# Patient Record
Sex: Female | Born: 1950 | Hispanic: Yes | Marital: Married
Health system: Midwestern US, Community
[De-identification: ages and names within clinical notes are randomized; demographics above are authoritative.]

## PROBLEM LIST (undated history)

## (undated) DIAGNOSIS — K2100 Gastro-esophageal reflux disease with esophagitis, without bleeding: Secondary | ICD-10-CM

## (undated) DIAGNOSIS — E119 Type 2 diabetes mellitus without complications: Principal | ICD-10-CM

## (undated) DIAGNOSIS — Z1211 Encounter for screening for malignant neoplasm of colon: Principal | ICD-10-CM

## (undated) DIAGNOSIS — E7800 Pure hypercholesterolemia, unspecified: Principal | ICD-10-CM

## (undated) DIAGNOSIS — R1013 Epigastric pain: Secondary | ICD-10-CM

## (undated) DIAGNOSIS — E785 Hyperlipidemia, unspecified: Secondary | ICD-10-CM

## (undated) DIAGNOSIS — L609 Nail disorder, unspecified: Secondary | ICD-10-CM

## (undated) DIAGNOSIS — R197 Diarrhea, unspecified: Secondary | ICD-10-CM

## (undated) DIAGNOSIS — A048 Other specified bacterial intestinal infections: Secondary | ICD-10-CM

## (undated) DIAGNOSIS — N201 Calculus of ureter: Secondary | ICD-10-CM

## (undated) DIAGNOSIS — K580 Irritable bowel syndrome with diarrhea: Secondary | ICD-10-CM

## (undated) DIAGNOSIS — B354 Tinea corporis: Secondary | ICD-10-CM

## (undated) DIAGNOSIS — E870 Hyperosmolality and hypernatremia: Secondary | ICD-10-CM

## (undated) DIAGNOSIS — Z1239 Encounter for other screening for malignant neoplasm of breast: Secondary | ICD-10-CM

## (undated) DIAGNOSIS — F419 Anxiety disorder, unspecified: Secondary | ICD-10-CM

## (undated) DIAGNOSIS — K5730 Diverticulosis of large intestine without perforation or abscess without bleeding: Secondary | ICD-10-CM

## (undated) DIAGNOSIS — 1 ERRONEOUS ENCOUNTER ICD10: Secondary | ICD-10-CM

## (undated) DIAGNOSIS — M549 Dorsalgia, unspecified: Secondary | ICD-10-CM

## (undated) DIAGNOSIS — Z1231 Encounter for screening mammogram for malignant neoplasm of breast: Secondary | ICD-10-CM

## (undated) DIAGNOSIS — R1011 Right upper quadrant pain: Secondary | ICD-10-CM

## (undated) DIAGNOSIS — D125 Benign neoplasm of sigmoid colon: Secondary | ICD-10-CM

## (undated) DIAGNOSIS — Z01419 Encounter for gynecological examination (general) (routine) without abnormal findings: Secondary | ICD-10-CM

## (undated) DIAGNOSIS — R928 Other abnormal and inconclusive findings on diagnostic imaging of breast: Secondary | ICD-10-CM

## (undated) DIAGNOSIS — K625 Hemorrhage of anus and rectum: Secondary | ICD-10-CM

## (undated) DIAGNOSIS — M51362 Other intervertebral disc degeneration, lumbar region with discogenic back pain and lower extremity pain: Principal | ICD-10-CM

## (undated) DIAGNOSIS — E559 Vitamin D deficiency, unspecified: Principal | ICD-10-CM

---

## 2010-03-29 LAB — CBC WITH DIFFERENTIAL
Absolute Baso #: 0 10*3/uL (ref 0.0–0.2)
Absolute Eos #: 0.3 10*3/uL (ref 0.0–0.4)
Absolute Lymph #: 2 10*3/uL (ref 1.0–4.8)
Absolute Mono #: 0.7 10*3/uL (ref 0.1–1.2)
Absolute Neut #: 3.8 10*3/uL (ref 1.8–7.7)
Basophils: 0 % (ref 0–2)
Eosinophils %: 4 % (ref 1–4)
Hematocrit: 40.1 % (ref 36–46)
Hemoglobin: 13.4 g/dL (ref 12.0–16.0)
Lymphocytes: 29 % (ref 24–44)
MCH: 29.5 pg (ref 26–34)
MCHC: 33.5 g/dL (ref 31–37)
MCV: 88.2 fL (ref 80–100)
MPV: 8.4 fL (ref 6.0–12.0)
Monocytes: 10 % (ref 2–11)
Platelet Count: 248 10*3/uL (ref 140–450)
RBC: 4.54 m/uL (ref 4.0–5.2)
RDW: 14.1 % (ref 12.5–15.4)
Seg Neutrophils: 57 % (ref 36–66)
WBC: 6.8 10*3/uL (ref 3.5–11.0)

## 2010-03-29 LAB — COMPREHENSIVE METABOLIC PANEL
ALT: 28 U/L (ref 4–40)
AST: 32 U/L (ref 8–36)
Albumin/Globulin Ratio: 1.3 (ref 1.0–2.7)
Albumin: 4.3 g/dL (ref 3.4–4.8)
Alkaline Phosphatase: 81 U/L (ref 25–100)
Anion Gap: 14 mmol/L (ref 8–16)
BUN: 20 mg/dL (ref 6–20)
CO2: 29 mmol/L (ref 20–31)
Calcium: 9.7 mg/dL (ref 8.6–10.4)
Chloride: 105 mmol/L (ref 98–110)
Creatinine: 0.75 mg/dL (ref 0.4–1.0)
GFR African American: >60 mL/min (ref >60)
GFR Non-African American: >60 mL/min (ref >60)
Glucose: 121 mg/dL — ABNORMAL HIGH (ref 74–106)
Potassium: 4.4 mmol/L (ref 3.5–5.1)
Protein, Total: 7.6 g/dL (ref 6.4–8.3)
Sodium: 144 mmol/L (ref 136–145)
Total Bilirubin: 0.5 mg/dL (ref 0.3–1.2)

## 2010-03-29 LAB — LIPID PANEL
Chol/HDL Ratio: 4.8 (ref <5.0)
Cholesterol: 203 mg/dL — ABNORMAL HIGH (ref <200)
HDL: 42 mg/dL (ref >40)
LDL Cholesterol: 133 mg/dL — ABNORMAL HIGH (ref <100)
Triglycerides: 138 mg/dL (ref <150)

## 2010-03-30 LAB — HEMOGLOBIN A1C
Estimated Avg Glucose: 148 mg/dL
Hemoglobin A1C: 6.8 % — ABNORMAL HIGH (ref 4.0–6.0)

## 2010-12-09 LAB — LIPID PANEL
Chol/HDL Ratio: 5.2 — ABNORMAL HIGH (ref <5.0)
Cholesterol: 207 mg/dL — ABNORMAL HIGH (ref <200)
HDL: 40 mg/dL — ABNORMAL LOW (ref >40)
LDL Cholesterol: 138 mg/dL — ABNORMAL HIGH (ref <100)
Triglycerides: 146 mg/dL (ref <150)

## 2010-12-09 LAB — CBC WITH AUTO DIFFERENTIAL
Absolute Eos #: 0.3 10*3/uL (ref 0.0–0.4)
Absolute Lymph #: 2.4 10*3/uL (ref 1.0–4.8)
Absolute Mono #: 0.7 10*3/uL (ref 0.1–1.2)
Absolute Neut #: 4.1 10*3/uL (ref 1.8–7.7)
Basophils Absolute: 0 10*3/uL (ref 0.0–0.2)
Basophils: 0 % (ref 0–2)
Eosinophils %: 4 % (ref 1–4)
Hematocrit: 40.7 % (ref 36–46)
Hemoglobin: 13.8 g/dL (ref 12.0–16.0)
Lymphocytes: 32 % (ref 24–44)
MCH: 29.9 pg (ref 26–34)
MCHC: 33.9 g/dL (ref 31–37)
MCV: 88.3 fL (ref 80–100)
MPV: 8.5 fL (ref 6.0–12.0)
Monocytes: 9 % (ref 2–11)
Platelets: 244 10*3/uL (ref 140–450)
RBC: 4.61 m/uL (ref 4.0–5.2)
RDW: 14 % (ref 12.5–15.4)
Seg Neutrophils: 55 % (ref 36–66)
WBC: 7.5 10*3/uL (ref 3.5–11.0)

## 2010-12-09 LAB — COMPREHENSIVE METABOLIC PANEL
ALT: 39 U/L (ref 4–40)
AST: 42 U/L — ABNORMAL HIGH (ref 8–36)
Albumin/Globulin Ratio: 1.2 (ref 1.0–2.7)
Albumin: 4.3 g/dL (ref 3.4–4.8)
Alkaline Phosphatase: 95 U/L (ref 25–100)
Anion Gap: 12 mmol/L (ref 8–16)
BUN: 23 mg/dL — ABNORMAL HIGH (ref 6–20)
CO2: 28 mmol/L (ref 20–31)
Calcium: 9.4 mg/dL (ref 8.6–10.4)
Chloride: 108 mmol/L (ref 98–110)
Creatinine: 0.79 mg/dL (ref 0.4–1.0)
GFR African American: >60 mL/min (ref >60)
GFR Non-African American: >60 mL/min (ref >60)
Glucose: 126 mg/dL — ABNORMAL HIGH (ref 74–106)
Potassium: 4.4 mmol/L (ref 3.5–5.1)
Protein, Total: 7.9 g/dL (ref 6.4–8.3)
Sodium: 144 mmol/L (ref 136–145)
Total Bilirubin: 0.35 mg/dL (ref 0.3–1.2)

## 2010-12-09 LAB — MICROALBUMIN, UR
Creatinine, Ur: 146.5 mg/dL
Microalb, Ur: <12 mg/L (ref 0–19)
Microalb/Crt. Ratio: UNDETERMINED mcg/mg creat

## 2010-12-09 LAB — HEMOGLOBIN A1C
Estimated Avg Glucose: 171 mg/dL
Hemoglobin A1C: 7.6 % — ABNORMAL HIGH (ref 4.0–6.0)

## 2011-04-18 NOTE — Telephone Encounter (Signed)
Dr.Chart is abstracted so you can go ahead and pre-order labs.

## 2011-04-18 NOTE — Telephone Encounter (Signed)
previous Marissa Sawyer Pt. Having cramping in her legs wants to know if you would like to order labs prior to apt on 05/30/2011. Says Dr.Cuevas would always re run labs about every 3 months. If not she understands and will dscuss at apt. Pt cb # [id number] or [phone].

## 2011-05-10 LAB — FORWARDED TO:

## 2011-05-10 NOTE — Progress Notes (Signed)
History and Physical  [facility]  [facility] [id number]  [place], [place]  [phone]   Fax [phone]  Marissa Sawyer  05/10/2011              61 y.o.  Chief Complaint   Patient presents with   ??? Established New Doctor   ??? Annual Exam              No referring provider defined for this encounter.    The patient was seen and examined. She has no chief complaint today and is here for her annual exam.  Her bowels are regular. There are no voiding complaints. She denies any bloating.  She denies vaginal discharge and was counseled on STD's and the need for barrier contraception.     HPI : annual  ________________________________________________________________________    Past Medical History   Diagnosis Date   ??? Type II or unspecified type diabetes mellitus without mention of complication, not stated as uncontrolled    ??? GERD (gastroesophageal reflux disease)    ??? Hypertension    ??? Hyperlipidemia                                                                    Past Surgical History   Procedure Laterality Date   ??? Tubal ligation     ??? Cholecystectomy  1971     History reviewed. No pertinent family history.  History   Smoking status   ??? Never Smoker    Smokeless tobacco   ??? Never Used     History   Alcohol Use No       MEDICATIONS:  Current Outpatient Prescriptions   Medication Sig Dispense Refill   ??? chlorhexidine (PERIDEX) 0.12 % solution        ??? HYDROcodone-acetaminophen (VICODIN) 5-500 MG per tablet        ??? lisinopril (PRINIVIL;ZESTRIL) 10 MG tablet Take 10 mg by mouth daily.       ??? glimepiride (AMARYL) 4 MG tablet Take 4 mg by mouth 2 times daily.       ??? metformin (GLUCOPHAGE) 500 MG tablet Take 500 mg by mouth once.       ??? simvastatin (ZOCOR) 10 MG tablet Take 10 mg by mouth nightly.         No current facility-administered medications for this visit.       ALLERGIES:  Allergies as of 05/10/2011   ??? (No Known Allergies)     Immunization status: up to date and  documented.      Gynecologic History:  Menarche: 61 yo  Menopause at 61 yo     No LMP recorded. Patient is postmenopausal.    Sexually Active: Yes    STD History: No     Permanent Sterilization: No   Reversible Birth Control: No        Hormone Replacement Exposure: No      Family History of Breast, Ovarian , Colon or Uterine Cancer: No   If YES see scanned worksheet.    Preventative Health Testing:  Date of Last Pap Smear: 2010  Abnormal Pap Smear History:   Colposcopy History:   Date of Last Mammogram: 01/25/11  Date of Last Colonoscopy:  Date of Last Bone Density:      ________________________________________________________________________  REVIEW OF SYSTEMS:       A minimum of an eleven point review of systems was completed.    Review Of Systems (11 point):  Constitutional: No fever, chills or malaise; No weight change or fatigue  Head and Eyes: No vision, Headache, Dizziness or trauma in last 12 months  ENT ROS: No hearing, Tinnitis, sinus or taste problems  Hematological and Lymphatic ROS:No Lymphoma, Von Willebrand's, Hemophillia or Bleeding History  Psych ROS: No Depression, Homicidal thoughts,suicidal thoughts, or anxiety  Breast ROS: No prior breast abnormalities or lumps  Respiratory ROS: No SOB, Pneumoniae,Cough, or Pulmonary Embolism History  Cardiovascular ROS: No Chest Pain with Exertion, Palpitations, Syncope, Edema, Arrhythmia  Gastrointestinal ROS: No Indigestion, Heartburn, Nausea, vomiting, Diarrhea, Constipation,or Bowel Changes; No Bloody Stools or melena  Genito-Urinary ROS: No Dysuria, Hematuria or Nocturia. No Urinary Incontinence or Vaginal Discharge  Musculoskeletal ROS: No Arthralgia, Arthritis,Gout,Osteoporosis or Rheumatism  Neurological ROS: No CVA, Migraines, Epilepsy, Seizure Hx, or Limb Weakness  Dermatological ROS: No Rash, Itching, Hives, Mole Changes or Cancer                                                                                                                                                                                                                                  PHYSICAL Exam:     Constitutional:  Blood pressure 102/60, pulse 72, resp. rate 16, height 5\' 5"  (1.651 m), weight 191 lb (86.637 kg), not currently breastfeeding.    General Appearance:  This  is a well Developed, well Nourished, well groomed female.      Her BMI was reviewed. Nutritional decision making was discussed.    Skin:  There was a Normal Inspection of the skin without rashes or lesions.  There were no rashes.  (Papular, Maculopapular, Hives, Pustular, Macular)     There were no lesions (Ulcers, Erythema, Abn. Appearing Nevi)            Lymphatic:  No Lymph Nodes were Palpable in the neck , axilla or groin.   # Of Lymph Nodes; Location ; Character [Normal]  [Shotty] [Tender] [Enlarged]     Neck and EENT:  The neck was supple. There were no masses   The thyroid was not enlarged and had no masses.  Perrla, EOMI B/L, TMI B/L No Abnormalities.   Throat inspected-No exudates or Masses, Nares Patent  No Masses        Respiratory:  The lungs were auscultated and found to be clear. There were no rales, rhonchi or wheezes. There was a good respiratory effort.    Cardiovascular:  The heart was in a regular rate and rhythm. . No S3 or S4. There was no murmur appreciated. Location, grade, and radiation are not applicable.     Extremities:  The patients extremities were without calf tenderness, edema, or varicosities.  There was full range of motion in all four extremities. Pulses in all four extremities were appreciated and are 2/4.    Abdomen:  The abdomen was soft and non-tender. There were good bowel sounds in all quadrants and there was no guarding, rebound or rigidity.  On evaluation there was no evidence of hepatosplenomegaly and there was no costal vertebral angel tenderness bilaterally.  No hernias were appreciated.     Abdominal Scars:     Psych:  The patient had a normal Orientation to: Time, Place,  Person, and Situation  There is no Mood / Affect changes    Breast:  (Chest)  normal appearance, no masses or tenderness  Self breast exams were reviewed in detail. Literature was given.    Pelvic Exam:  Vulva and vagina appear normal. Bimanual exam reveals normal uterus and adnexa.    Rectal Exam:  exam declined by patient          Musculosk:  Normal Gait and station was noted.  Digits were evaluated without abnormal findings.  Range of motion, stability and strength were evaluated and found to be appropriate for the patients age.              ASSESSMENT:      61 y.o. Annual  1. Routine gynecological examination  PAP SMEAR, DEXA Vertebral Fracture Assessment          Chief Complaint   Patient presents with   ??? Established New Doctor   ??? Annual Exam          Past Medical History   Diagnosis Date   ??? Type II or unspecified type diabetes mellitus without mention of complication, not stated as uncontrolled    ??? GERD (gastroesophageal reflux disease)    ??? Hypertension    ??? Hyperlipidemia          Patient Active Problem List   Diagnosis   ??? Type II or unspecified type diabetes mellitus without mention of complication, not stated as uncontrolled   ??? GERD (gastroesophageal reflux disease)   ??? Hyperlipidemia   ??? Hyperlipidemia          Hereditary Breast, Ovarian, Colon and Uterine Cancer screening Done.          Tobacco & Secondary smoke risks reviewed; instructed on cessation and avoidance      Counseling Completed:  The patient was informed of the recommended preventative health recommendations.  1. Pap smears every year if there is no dysplasia  2. Mammograms begin every year at 61 yo if no abnormalities are found and no family     History.  3. Bone density studies every 2-3 years. Begin at 61 yo. If no fracture history or osteoporosis family history.(significant).  4. Colonoscopy begin at 61 yo. Repeat every ten years if negative and no family history.  5. Calcium of 1200-1500 mg/day in split dosing  6. Vitamin D  400-800 IU/day  7. All other preventative health recommendations will be managed by the patients Primary care physician.  PLAN:  No Follow-up on file.  Repeat pap every 1 year if negative.   Mammograms every 1 year. If 61 yo and last mammogram was negative.  Calcium and Vitamin D dosing reviewed.  Colonoscopy screening reviewed as well as onset for bone density testing.  Birth control and barrier recommendations discussed.  STD counseling and prevention reviewed.  Gardisil counseling completed for all patients 9-26 yo.  Routine health maintenance per patients PCP.  Orders Placed This Encounter   Procedures   ??? DEXA Vertebral Fracture Assessment     Order Specific Question:  Reason for exam:     Answer:  other ovarian failure   ??? PAP SMEAR     Order Specific Question:  Collection Type?     Answer:  ( 2) Thin prep     Order Specific Question:  Diagnostic or Screening?     Answer:  ( 1) Screening     Order Specific Question:  HPV Requested?     Answer:  ( 5) HPV if ASCUS     Order Specific Question:  Present Status     Answer:  ( 3) Post menopausal     Order Specific Question:  Hysterectomy?     Answer:  N/A     Order Specific Question:  Hormone Replacement?     Answer:  N/A     Order Specific Question:  Birth Control     Answer:  N/A     Order Specific Question:  Present Illness     Answer:  (12) No present illness     Order Specific Question:  Specify Illness if other     Answer:  NA     Order Specific Question:  HPV Status?     Answer:  N/A     Order Specific Question:  Clinical History     Answer:  N/A     Order Specific Question:  Specify Clinical History if other     Answer:  NA     Order Specific Question:  Last Menstrual Period     Answer:  2008     Order Specific Question:  Last PAP Smear     Answer:  2010     Order Specific Question:  Prior Abnormal Smear     Answer:  (13) No prior abnormal smear     Order Specific Question:  If Prior Abnormal, Give Date     Answer:  n/a     Order Specific  Question:  Prior Treatment     Answer:  N/A     Order Specific Question:  Specify Treatment if other     Answer:  NA     Order Specific Question:  Specimen Source     Answer:  ( 3) Cervical/Endocervical/Vaginal     Order Specific Question:  Specify Source if other     Answer:  NA

## 2011-05-12 LAB — GYN CYTOLOGY

## 2011-05-30 LAB — LIPID PANEL
Chol/HDL Ratio: 4.4 (ref <5.0)
Cholesterol: 160 mg/dL (ref <200)
HDL: 36 mg/dL — ABNORMAL LOW (ref >40)
LDL Cholesterol: 95 mg/dL (ref <100)
Triglycerides: 146 mg/dL (ref <150)

## 2011-05-30 LAB — VITAMIN D 25 HYDROXY: Vit D, 25-Hydroxy: 15 ng/mL — ABNORMAL LOW (ref 30–100)

## 2011-05-30 LAB — CBC WITH AUTO DIFFERENTIAL
Absolute Eos #: 0.3 10*3/uL (ref 0.0–0.4)
Absolute Lymph #: 2.3 10*3/uL (ref 1.0–4.8)
Absolute Mono #: 0.7 10*3/uL (ref 0.1–1.2)
Absolute Neut #: 3.8 10*3/uL (ref 1.8–7.7)
Basophils Absolute: 0 10*3/uL (ref 0.0–0.2)
Basophils: 0 % (ref 0–2)
Eosinophils %: 4 % (ref 1–4)
Hematocrit: 39.5 % (ref 36–46)
Hemoglobin: 13.1 g/dL (ref 12.0–16.0)
Lymphocytes: 32 % (ref 24–44)
MCH: 29.1 pg (ref 26–34)
MCHC: 33.1 g/dL (ref 31–37)
MCV: 88 fL (ref 80–100)
MPV: 8.2 fL (ref 6.0–12.0)
Monocytes: 10 % (ref 2–11)
Platelets: 225 10*3/uL (ref 140–450)
RBC: 4.49 m/uL (ref 4.0–5.2)
RDW: 13.8 % (ref 12.5–15.4)
Seg Neutrophils: 54 % (ref 36–66)
WBC: 7.1 10*3/uL (ref 3.5–11.0)

## 2011-05-30 LAB — COMPREHENSIVE METABOLIC PANEL
ALT: 42 U/L — ABNORMAL HIGH (ref 4–40)
AST: 45 U/L — ABNORMAL HIGH (ref 8–36)
Albumin/Globulin Ratio: 1.3 (ref 1.0–2.7)
Albumin: 4.4 g/dL (ref 3.4–4.8)
Alkaline Phosphatase: 85 U/L (ref 25–100)
Anion Gap: 13 mmol/L (ref 8–16)
BUN: 18 mg/dL (ref 6–20)
CO2: 28 mmol/L (ref 20–31)
Calcium: 9.3 mg/dL (ref 8.6–10.4)
Chloride: 107 mmol/L (ref 98–110)
Creatinine: 0.77 mg/dL (ref 0.4–1.0)
GFR African American: >60 mL/min (ref >60)
GFR Non-African American: >60 mL/min (ref >60)
Glucose: 126 mg/dL — ABNORMAL HIGH (ref 74–106)
Potassium: 4.4 mmol/L (ref 3.5–5.1)
Sodium: 143 mmol/L (ref 136–145)
Total Bilirubin: 0.55 mg/dL (ref 0.3–1.2)
Total Protein: 7.7 g/dL (ref 6.4–8.3)

## 2011-05-30 LAB — HEMOGLOBIN A1C
Estimated Avg Glucose: 174 mg/dL
Hemoglobin A1C: 7.7 % — ABNORMAL HIGH (ref 4.0–6.0)

## 2011-05-30 MED ORDER — BLOOD GLUCOSE TEST VI STRP
ORAL_STRIP | Status: DC
Start: 2011-05-30 — End: 2015-02-04

## 2011-05-30 MED ORDER — FREESTYLE SYSTEM KIT
PACK | Freq: Once | Status: DC
Start: 2011-05-30 — End: 2015-02-04

## 2011-05-30 MED ORDER — LANCETS MISC
Status: DC
Start: 2011-05-30 — End: 2015-02-04

## 2011-05-30 NOTE — Progress Notes (Signed)
Pt is here to establish, previous Cuevas patient, needs routine labs. Pt may need order for colonscopy and glucose meter and supplies.    Have you seen any other physician or provider since your last visit no    Have you had any other diagnostic tests since your last visit? yes - mammogram    Have you changed or stopped any medications since your last visit including any over-the-counter medicines, vitamins, or herbal medicines? no     Are you taking all your prescribed medications? Yes    If NO, why?          Health Maintenance  No health maintenance topics applied.  No health maintenance topics applied.  No health maintenance topics applied.  Health Maintenance Due   Topic Date Due   . Colon Cancer Screening Colonoscopy  11/30/1999     No health maintenance topics applied.    No health maintenance topics applied.        Hemoglobin A1C (%)   Date Value   12/09/2010 7.6*   03/29/2010 6.8*             ( goal A1C is < 7)   LDL Cholesterol (mg/dL)   Date Value   16/03/958 138*         (goal LDL is <100)        BP Readings from Last 3 Encounters:   05/30/11 110/74   05/10/11 102/60        (goal 120/80)      Next Visit Date:  05/30/2011    Last Visit Date:    Patient Active Problem List:     Type II or unspecified type diabetes mellitus without mention of complication, not stated as uncontrolled     GERD (gastroesophageal reflux disease)     Hyperlipidemia     Hyperlipidemia    i reviewed all medications with the patient.       Subjective:      Marissa Sawyer is here for follow up of elevated cholesterol, elevated blood pressure, and diabetes. Compliance with treatment has been good. Patient denies muscle pain associated with her medications. She is not exercising and is adherent to a low-salt diet.  Blood pressure is well controlled at home. Cardiac symptoms: none. Patient denies: chest pain, dyspnea, irregular heart beat, lower extremity edema, palpitations and syncope. Cardiovascular risk factors: diabetes mellitus,  dyslipidemia, hypertension, obesity (BMI >= 30 kg/m2) and sedentary lifestyle. Use of agents associated with hypertension: none. History of target organ damage: none. Current diabetic symptoms include: hyperglycemia. Patient denies foot ulcerations, hypoglycemia , nausea, polydipsia, polyuria and vomiting. Evaluation to date has included: fasting blood sugar, fasting lipid panel and hemoglobin A1C. Home sugars: patient does not check sugars yet. Current treatments: Continued sulfonylurea which has been effective, Continued metformin which has been effective, Continued statin which has been effective and Continued ACE inhibitor/ARB which has been effective. Last dilated eye exam never.    Patient's medications, allergies, past medical, surgical, social and family histories were reviewed and updated as appropriate.    Review of Systems  A comprehensive review of systems was negative except for: Eyes: positive for visual disturbance  Gastrointestinal: positive for reflux symptoms  Behavioral/Psych: positive for obesity      Objective:      BP 110/74  Pulse 60  Temp(Src) 97.5 F (36.4 C) (Oral)  Resp 16  Ht 5\' 4"  (1.626 m)  Wt 189 lb 9.6 oz (86.002 kg)  BMI 32.53 kg/m2  Breastfeeding?  No    General Appearance:    Alert, cooperative, no distress, appears stated age   Head:    Normocephalic, without obvious abnormality, atraumatic   Eyes:    PERRL, conjunctiva/corneas clear, EOM's intact, fundi     benign, both eyes   Ears:    Normal TM's and external ear canals, both ears   Nose:   Nares normal, septum midline, mucosa normal, no drainage    or sinus tenderness   Throat:   Lips, mucosa, and tongue normal; teeth and gums normal   Neck:   Supple, symmetrical, trachea midline, no adenopathy;     thyroid:  no enlargement/tenderness/nodules; no carotid    bruit or JVD   Back:     Symmetric, no curvature, ROM normal, no CVA tenderness   Lungs:     Clear to auscultation bilaterally, respirations unlabored   Chest Wall:     No tenderness or deformity    Heart:    Regular rate and rhythm, S1 and S2 normal, no murmur, rub   or gallop   Breast Exam:    Deferred   Abdomen:     Soft, non-tender, bowel sounds active all four quadrants,     no masses, no organomegaly   Genitalia:    Deferred   Rectal:    Deferred   Extremities:   Extremities normal, atraumatic, no cyanosis or edema   Pulses:   2+ and symmetric all extremities   Skin:   Skin color, texture, turgor normal, no rashes or lesions   Lymph nodes:   Cervical, supraclavicular, and axillary nodes normal   Neurologic:   CNII-XII intact, normal strength, sensation and reflexes     throughout       Lab Review  Lab Results   Component Value Date    CHOL 207 12/09/2010    CHOL 203 03/29/2010    HDL 40 12/09/2010    HDL 42 03/29/2010         Assessment:      Dyslipidemia under fair control.  Hypertension, normal blood pressure . Evidence of target organ damage: none.  Diabetes mellitus Type II, under fair control. GERD. Vit D def. Obesity.        Plan:      1. Continue dietary measures.  2. Continue regular exercise.  3. Lipid-lowering medications: continue Zocor.  4. Counseling at today's visit: discussed the need for weight loss, focused on the need for regular aerobic exercise and discussed the advantages of a diet low in carbohydrates.  Reminded to get yearly retinal exam.  5. Follow up in 3 months or prn.  6. Check labs per orders.  7. Glucometer & supplies per orders.  8. Continue current meds.       Zephaniah received counseling on the following healthy behaviors: nutrition and exercise    Patient given educational materials on nutrition    Was a self-tracking handout given in paper form or via MyChart? No  If yes, see orders or list here.    Discussed use, benefit, and side effects of prescribed medications.  Barriers to medication compliance addressed.  All patient questions answered.  Pt voiced understanding.

## 2011-05-30 NOTE — Patient Instructions (Signed)
Diabetes Diet Guidelines: After Your Visit  Your Care Instructions  A healthy diet is important to manage diabetes. It helps keep your blood sugar at a target level (which you set with your doctor). A diet for diabetes does not mean that you have to eat special foods. You can eat what your family eats, including sweets once in a while. But you do have to pay attention to how often you eat and how much you eat of certain foods.  Managing the amount of carbohydrate you eat is an important part of a healthy diet for diabetes. Carbohydrate is found in sugar and sweets, grains, fruit, starchy vegetables, and milk and yogurt.  You may want to work with a dietitian or a certified diabetes educator (CDE) to help you plan meals and snacks. A dietitian or CDE can also help you lose weight if that is one of your goals.  Follow-up care is a key part of your treatment and safety. Be sure to make and go to all appointments, and call your doctor if you are having problems. It???s also a good idea to know your test results and keep a list of the medicines you take.  How can you care for yourself at home?  ?? Learn which foods have carbohydrate.  ?? Bread, cereal, pasta, and rice have about 15 grams of carbohydrate in a serving. A serving is 1 slice of bread (1 ounce), ?? cup of cooked cereal, or 1/3 cup of cooked pasta or rice.  ?? Fruits have 15 grams of carbohydrate in a serving. A serving is 1 small fresh fruit, such as an apple or orange; ?? of a banana; ?? cup of cooked or canned fruit; ?? cup of fruit juice; 1 cup of melon or raspberries; or 2 tablespoons of dried fruit.  ?? Milk and no-sugar-added yogurt have 15 grams of carbohydrate in a serving. A serving is 1 cup of milk or 2/3 cup of no-sugar-added yogurt.  ?? Starchy vegetables have 15 grams of carbohydrate in a serving. A serving is ?? cup of mashed potatoes or sweet potato; 1 cup winter squash; ?? of a small baked potato; 1/4 cup of cooked dried beans; or ?? cup cooked corn or  green peas.  ?? Learn how much carbohydrate to eat each day. A dietitian or CDE can teach you how to keep track of the amount of carbohydrate you eat. This is called carbohydrate counting.  ?? Try to eat about the same amount of carbohydrate at each meal. Your dietitian or CDE can tell you how much carbohydrate to eat at each meal and snack. Do not "save up" your daily allowance of carbohydrate to eat at one meal.  ?? If you are not sure how to count carbohydrate grams, use the Plate Method to plan meals. It is a good, quick way to make sure that you have a balanced meal. It also helps you spread carbohydrate throughout the day. Divide your plate by types of foods. Put vegetables on half the plate, meat or other protein food on one-quarter of the plate, and a grain or starchy vegetable (such as brown rice or a potato) in the final quarter of the plate. To this you can add a small piece of fruit and 1 cup of milk or yogurt, depending on how much carbohydrate you are supposed to eat at a meal.  ?? Limit saturated fat, such as the fat from meat and dairy products. Choose lean cuts of meat and nonfat or   low-fat dairy products. Use olive or canola oil instead of butter or shortening when cooking. This is a healthy choice because people who have diabetes are at higher-than-average risk of heart disease.  ?? Do not skip meals. Your blood sugar may drop too low if you skip meals and take certain diabetes pills or insulin.  ?? Work with your doctor to write up a sick-day plan for what to do on days when you are sick. Your blood sugar can go up or down depending on your illness and whether you can keep food down. Call your doctor when you are sick, to see if you need to adjust your pills or insulin.  ?? Check with your doctor before you drink alcohol. Alcohol can cause your blood sugar to drop too low. Alcohol can also cause a bad reaction if you take certain diabetes pills.   Where can you learn more?   Go to  [email or web address] and sign in to your MyChart account.    Enter I147 in the Search Health Information box to learn more about ???Diabetes Diet Guidelines: After Your Visit.???    If you do not have an account, please click on the ???Sign Up Now??? link.   ?? 2006-2012 [employer]. Care instructions adapted under license by [facility]. This care instruction is for use with your licensed healthcare professional. If you have questions about a medical condition or this instruction, always ask your healthcare professional. [employer] disclaims any warranty or liability for your use of this information.  Content Version: 9.4.94723; Last Revised: February 14, 2010

## 2011-06-26 MED ORDER — LISINOPRIL 10 MG PO TABS
10 MG | ORAL_TABLET | Freq: Every day | ORAL | Status: DC
Start: 2011-06-26 — End: 2012-03-04

## 2011-06-26 MED ORDER — GLIMEPIRIDE 4 MG PO TABS
4 MG | ORAL_TABLET | Freq: Two times a day (BID) | ORAL | Status: DC
Start: 2011-06-26 — End: 2011-12-15

## 2011-06-26 MED ORDER — METFORMIN HCL 500 MG PO TABS
500 MG | ORAL_TABLET | Freq: Once | ORAL | Status: DC
Start: 2011-06-26 — End: 2011-07-24

## 2011-06-26 NOTE — Telephone Encounter (Signed)
Med refill request

## 2011-07-24 MED ORDER — METFORMIN HCL 500 MG PO TABS
500 MG | ORAL_TABLET | Freq: Every day | ORAL | Status: DC
Start: 2011-07-24 — End: 2012-01-15

## 2011-07-24 MED ORDER — CLOTRIMAZOLE-BETAMETHASONE 1-0.05 % EX CREA
CUTANEOUS | Status: DC
Start: 2011-07-24 — End: 2012-06-27

## 2011-07-24 MED ORDER — BUSPIRONE HCL 10 MG PO TABS
10 MG | ORAL_TABLET | Freq: Three times a day (TID) | ORAL | Status: DC | PRN
Start: 2011-07-24 — End: 2012-06-27

## 2011-07-24 NOTE — Progress Notes (Signed)
Have you seen any other physician or provider since your last visit no    Have you had any other diagnostic tests since your last visit? yes - labs    Have you changed or stopped any medications since your last visit including any over-the-counter medicines, vitamins, or herbal medicines? no     Are you taking all your prescribed medications? Yes    If NO, why? - n/a            What is your goal for your visit today? - check acid reflux  Barriers to success: none  Plan for overcoming my barriers: N/A     Confidence: 10/10  Date goal set: 07/24/2011  Date expected to reach goal: today    Patient/Caregiver verbalize understanding of medications.    Subjective:       Marissa Sawyer is an 61 y.o. female who presents for evaluation of heartburn. This has been associated with abdominal bloating, heartburn and nocturnal burning. She denies choking on food, difficulty swallowing, hematemesis, melena and nausea. Symptoms have been present for a few weeks. She denies dysphagia. She has not lost weight. She denies melena, hematochezia, hematemesis, and coffee ground emesis. Medical therapy in the past has included: proton pump inhibitors but she admits to not taking Prevacid regularly. She also reports that she has been feeling anxious & stressed recently.    Patient's medications, allergies, past medical, surgical, social and family histories were reviewed and updated as appropriate.    Review of Systems  A comprehensive review of systems was negative except for: Gastrointestinal: positive for reflux symptoms  Musculoskeletal: positive for arthralgias and myalgias  Behavioral/Psych: positive for anxiety and obesity     Objective:       BP 130/74   Pulse 73   Resp 16   Ht 5\' 4"  (1.626 m)   Wt 189 lb 9.6 oz (86.002 kg)   BMI 32.53 kg/m2   Breastfeeding? No    General Appearance:    Alert, cooperative, no distress, appears stated age   Head:    Normocephalic, without obvious abnormality, atraumatic   Eyes:    PERRL,  conjunctiva/corneas clear, EOM's intact, fundi     benign, both eyes   Ears:    Normal TM's and external ear canals, both ears   Nose:   Nares normal, septum midline, mucosa normal, no drainage    or sinus tenderness   Throat:   Lips, mucosa, and tongue normal; teeth and gums normal   Neck:   Supple, symmetrical, trachea midline, no adenopathy;     thyroid:  no enlargement/tenderness/nodules; no carotid    bruit or JVD   Back:     Symmetric, no curvature, ROM normal, no CVA tenderness   Lungs:     Clear to auscultation bilaterally, respirations unlabored   Chest Wall:    No tenderness or deformity    Heart:    Regular rate and rhythm, S1 and S2 normal, no murmur, rub   or gallop   Breast Exam:    Deferred   Abdomen:     Soft, non-tender, bowel sounds active all four quadrants,     no masses, no organomegaly   Genitalia:    Deferred   Rectal:    Deferred   Extremities:   Extremities normal, atraumatic, no cyanosis or edema   Pulses:   2+ and symmetric all extremities   Skin:   Skin color, texture, turgor normal, + rashes/lesions on bilateral shins  Lymph nodes:   Cervical, supraclavicular, and axillary nodes normal   Neurologic:   CNII-XII intact, normal strength, sensation and reflexes     throughout        Assessment:      Gastroesophageal Reflux Disease,  Ongoing. Tinea corporis. NIDDM. HTN. Hyperlipidemia. Anxiety. Obesity.      Plan:      Nonpharmacologic treatments were discussed including: eating smaller meals, elevation of the head of bed at night, avoidance of caffeine, chocolate, nicotine and peppermint, and avoiding tight fitting clothing.  Follow up in 2 months or sooner as needed.  Continue Prevacid.   Refilled Metformin & Lotrisone.  Start Buspar.    Marissa Sawyer received counseling on the following healthy behaviors: nutrition    Patient given Transport planner on nutrition    Was a self-tracking handout given in paper form or via MyChart? No  If yes, see orders or list here.    Discussed use, benefit,  and side effects of prescribed medications.  Barriers to medication compliance addressed.  All patient questions answered.  Pt voiced understanding.

## 2011-07-24 NOTE — Patient Instructions (Signed)
Gastroesophageal Reflux Disease (GERD): After Your Visit  Your Care Instructions  Gastroesophageal reflux disease (GERD) is the backward flow of stomach acid into the esophagus. The esophagus is the tube that leads from your throat to your stomach. A one-way valve prevents the stomach acid from moving up into this tube. When you have GERD, this valve does not close tightly enough.  If you have mild GERD symptoms including heartburn, you may be able to control the problem with antacids or over-the-counter medicine. Changing your diet, losing weight, and making other lifestyle changes can also help reduce symptoms.  Follow-up care is a key part of your treatment and safety. Be sure to make and go to all appointments, and call your doctor if you are having problems. It???s also a good idea to know your test results and keep a list of the medicines you take.  How can you care for yourself at home?  ?? Take your medicines exactly as prescribed. Call your doctor if you think you are having a problem with your medicine.  ?? Your doctor may recommend over-the-counter medicine. For mild or occasional indigestion, antacids, such as Tums, Gaviscon, Mylanta, or Maalox, may help. Your doctor also may recommend over-the-counter acid reducers, such as Pepcid AC, Tagamet HB, Zantac 75, or Prilosec. Read and follow all instructions on the label. If you use these medicines often, talk with your doctor.  ?? Change your eating habits.  ?? It???s best to eat several small meals instead of two or three large meals.  ?? After you eat, wait 2 to 3 hours before you lie down.  ?? Chocolate, mint, and alcohol can make GERD worse.  ?? Spicy foods, foods that have a lot of acid (like tomatoes and oranges), and coffee can make GERD symptoms worse in some people. If your symptoms are worse after you eat a certain food, you may want to stop eating that food to see if your symptoms get better.  ?? Do not smoke or chew tobacco. Smoking can make GERD worse. If  you need help quitting, talk to your doctor about stop-smoking programs and medicines. These can increase your chances of quitting for good.  ?? If you have GERD symptoms at night, raise the head of your bed 6 to 8 inches by putting the frame on blocks or placing a foam wedge under the head of your mattress. (Adding extra pillows does not work.)  ?? Do not wear tight clothing around your middle.  ?? Lose weight if you need to. Losing just 5 to 10 pounds can help.  When should you call for help?  Call your doctor now or seek immediate medical care if:  ?? You have new or different belly pain.  ?? Your stools are black and tarlike or have streaks of blood.  Watch closely for changes in your health, and be sure to contact your doctor if:  ?? Your symptoms have not improved after 2 days.  ?? Food seems to catch in your throat or chest.   Where can you learn more?   Go to [email or web address] and sign in to your MyChart account.    Enter T927 in the Search Health Information box to learn more about ???Gastroesophageal Reflux Disease (GERD): After Your Visit.???    If you do not have an account, please click on the ???Sign Up Now??? link.   ?? 2006-2012 [employer]. Care instructions adapted under license by [facility]. This care instruction is for use with your   licensed healthcare professional. If you have questions about a medical condition or this instruction, always ask your healthcare professional. [employer] disclaims any warranty or liability for your use of this information.  Content Version: 9.4.94723; Last Revised: May 20, 2009

## 2011-09-20 MED ORDER — TIZANIDINE HCL 4 MG PO TABS
4 MG | ORAL_TABLET | Freq: Three times a day (TID) | ORAL | Status: DC | PRN
Start: 2011-09-20 — End: 2011-12-04

## 2011-09-20 NOTE — Patient Instructions (Signed)
Neck Strain or Sprain: Exercises  Your Care Instructions  Here are some examples of typical rehabilitation exercises for your condition. Start each exercise slowly. Ease off the exercise if you start to have pain.  Your doctor or physical therapist will tell you when you can start these exercises and which ones will work best for you.  How to do the exercises  Neck rotation    1. Sit in a firm chair, or stand up straight.  2. Keeping your chin level, turn your head to the right, and hold for 15 to 30 seconds.  3. Turn your head to the left and hold for 15 to 30 seconds.  4. Repeat 2 to 4 times to each side.  Neck stretches    1. Look straight ahead, and tip your right ear to your right shoulder. Do not let your left shoulder rise up as you tip your head to the right.  2. Hold for 15 to 30 seconds.  3. Tilt your head to the left. Do not let your right shoulder rise up as you tip your head to the left.  4. Hold for 15 to 30 seconds.  5. Repeat 2 to 4 times to each side.  Forward neck flexion    1. Sit in a firm chair, or stand up straight.  2. Bend your head forward.  3. Hold for 15 to 30 seconds.  4. Repeat 2 to 4 times.  Lateral (side) bend strengthening    1. With your right hand, place your first two fingers on your right temple.  2. Start to bend your head to the side while using gentle pressure from your fingers to keep your head from bending.  3. Hold for about 6 seconds.  4. Repeat 8 to 12 times.  5. Switch hands and repeat the same exercise on your left side.  Forward bend strengthening    1. Place your first two fingers of either hand on your forehead.  2. Start to bend your head forward while using gentle pressure from your fingers to keep your head from bending.  3. Hold for about 6 seconds.  4. Repeat 8 to 12 times.  Neutral position strengthening    1. Using one hand, place your fingertips on the back of your head at the top of your neck.  2. Start to bend your head backward while using gentle pressure  from your fingers to keep your head from bending.  3. Hold for about 6 seconds.  4. Repeat 8 to 12 times.  Chin tuck    1. Lie on the floor with a rolled-up towel under your neck. Your head should be touching the floor.  2. Slowly bring your chin toward your chest.  3. Hold for a count of 6, and then relax for up to 10 seconds.  4. Repeat 8 to 12 times.  Follow-up care is a key part of your treatment and safety. Be sure to make and go to all appointments, and call your doctor if you are having problems. It's also a good idea to know your test results and keep a list of the medicines you take.   Where can you learn more?   Go to [email or web address] and sign in to your MyChart account. Enter M679 in the Search Health Information box to learn more about ???Neck Strain or Sprain: Exercises.???    If you do not have an account, please click on the ???Sign Up Now??? link.     ??   2006-2013 [employer]. Care instructions adapted under license by [facility]. This care instruction is for use with your licensed healthcare professional. If you have questions about a medical condition or this instruction, always ask your healthcare professional. [employer] disclaims any warranty or liability for your use of this information.  Content Version: 9.7.130178; Last Revised: January 07, 2010

## 2011-09-20 NOTE — Progress Notes (Signed)
Patient is here because she keeps having a sharp pain shoot up her neck and into the top of head.      Have you seen any other physician or provider since your last visit no    Have you had any other diagnostic tests since your last visit? no    Have you changed or stopped any medications since your last visit including any over-the-counter medicines, vitamins, or herbal medicines? no     Are you taking all your prescribed medications? Yes    If NO, why?              Health Maintenance  No health maintenance topics applied.  No health maintenance topics applied.  No health maintenance topics applied.  Health Maintenance Due   Topic Date Due   ??? Colon Cancer Screening Colonoscopy  11/30/1999     No health maintenance topics applied.    AST (U/L)   Date Value   05/30/2011 45*        ALT (U/L)   Date Value   05/30/2011 42*     LDL Cholesterol (mg/dL)   Date Value   08/01/4130 95      (goal LDL is <100)     BP Readings from Last 3 Encounters:   07/24/11 130/74   05/30/11 110/74   05/10/11 102/60        (goal 120/80)      Next Visit Date:  09/20/2011    Last Visit Date:    Patient Active Problem List:     Type II or unspecified type diabetes mellitus without mention of complication, not stated as uncontrolled     GERD (gastroesophageal reflux disease)     Hyperlipidemia     Hyperlipidemia    I reviewed all medications with the patient.    Subjective:      Marissa Sawyer is an 61 y.o. female who presents with arthralgias and myalgias that began a few weeks ago. Pain is located in head & neck. The pain is described as intermittent, moderate, aching, sharp and shooting.  Associated symptoms include: tenderness. The patient has tried nothing for pain relief. Related to injury: no.    Patient's medications, allergies, past medical, surgical, social and family histories were reviewed and updated as appropriate.    Review of Systems  A comprehensive review of systems was negative except for: Gastrointestinal: positive for reflux  symptoms  Musculoskeletal: positive for arthralgias, myalgias and neck pain  Neurological: positive for headaches and memory problems  Behavioral/Psych: positive for anxiety and obesity   8 of 10 ros positive as above     Objective:      BP 146/90   Temp(Src) 98 ??F (36.7 ??C) (Oral)   Wt 188 lb 9.6 oz (85.548 kg)   BMI 32.36 kg/m2   Breastfeeding? No    General Appearance:    Alert, cooperative, no distress, appears stated age   Head:    Normocephalic, without obvious abnormality, atraumatic   Eyes:    PERRL, conjunctiva/corneas clear, EOM's intact, fundi     benign, both eyes   Ears:    Normal TM's and external ear canals, both ears   Nose:   Nares normal, septum midline, mucosa normal, no drainage    or sinus tenderness   Throat:   Lips, mucosa, and tongue normal; teeth and gums normal   Neck:   Supple, symmetrical, trachea midline, no adenopathy;     thyroid:  no enlargement/nodules; no carotid bruit or  JVD, posterior muscles tender   Back:     Symmetric, no curvature, ROM normal, no CVA tenderness   Lungs:     Clear to auscultation bilaterally, respirations unlabored   Chest Wall:    No tenderness or deformity    Heart:    Regular rate and rhythm, S1 and S2 normal, no murmur, rub   or gallop   Breast Exam:    Deferred   Abdomen:     Soft, non-tender, bowel sounds active all four quadrants,     no masses, no organomegaly   Genitalia:    Deferred   Rectal:    Deferred   Extremities:   Extremities normal, atraumatic, no cyanosis or edema   Pulses:   2+ and symmetric all extremities   Skin:   Skin color, texture, turgor normal, no rashes or lesions   Lymph nodes:   Cervical, supraclavicular, and axillary nodes normal   Neurologic:   CNII-XII intact, normal strength, sensation and reflexes     throughout         Assessment:      Cervicodynia. Cervical strain.  HA's. DM II. HTN. Hyperlipidemia. Forgetfulness. Obesity.     Plan:      Started Zanaflex per orders.  Return to clinic in 3 months or as needed.  Continue  other meds.   Spent 5 mins answering all questions.    Dayleen received counseling on the following healthy behaviors: nutrition and exercise    Patient given educational materials on exercise    Was a self-tracking handout given in paper form or via MyChart? No  If yes, see orders or list here.    Discussed use, benefit, and side effects of prescribed medications.  Barriers to medication compliance addressed.  All patient questions answered.  Pt voiced understanding.

## 2011-11-28 MED ORDER — SIMVASTATIN 10 MG PO TABS
10 MG | ORAL_TABLET | Freq: Every evening | ORAL | Status: DC
Start: 2011-11-28 — End: 2012-01-15

## 2011-11-28 NOTE — Telephone Encounter (Signed)
appt 12/21/11
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
## 2011-12-04 NOTE — Progress Notes (Signed)
Have you seen any other physician or provider since your last visit yes - ER    Have you had any other diagnostic tests since your last visit? yes - xrays neck and chest    Have you changed or stopped any medications since your last visit including any over-the-counter medicines, vitamins, or herbal medicines? no     Are you taking all your prescribed medications? Yes    If NO, why? - n/a            What is your goal for your visit today? - f/u hosp   Barriers to success: none  Plan for overcoming my barriers: N/A     Confidence: 10/10  Date goal set: 12/04/2011  Date expected to reach goal: today    Patient/Caregiver verbalize understanding of medications.

## 2011-12-04 NOTE — Patient Instructions (Signed)
Neck Arthritis: Exercises  Your Care Instructions  Here are some examples of typical rehabilitation exercises for your condition. Start each exercise slowly. Ease off the exercise if you start to have pain.  Your doctor or physical therapist will tell you when you can start these exercises and which ones will work best for you.  How to do the exercises  Neck stretches to the side    1. This stretch works best if you keep your shoulder down as you lean away from it. To help you remember to do this, start by relaxing your shoulders and lightly holding on to your thighs or your chair.  2. Tilt your head toward your shoulder and hold for 15 to 30 seconds. Let the weight of your head stretch your muscles.  3. Repeat 2 to 4 times toward each shoulder.  Chin tuck    1. Lie on the floor with a rolled-up towel under your neck. Your head should be touching the floor.  2. Slowly bring your chin toward your chest.  3. Hold for a count of 6, and then relax for up to 10 seconds.  4. Repeat 8 to 12 times.  Active cervical rotation    1. Sit in a firm chair, or stand up straight.  2. Keeping your chin level, turn your head to the right, and hold for 15 to 30 seconds.  3. Turn your head to the left and hold for 15 to 30 seconds.  4. Repeat 2 to 4 times to each side.  Shoulder blade squeeze    1. While standing, squeeze your shoulder blades together.  2. Do not raise your shoulders up as you are squeezing.  3. Hold for 6 seconds.  4. Repeat 8 to 12 times.  Shoulder rolls    1. Sit comfortably with your feet shoulder-width apart. You can also do this exercise standing up.  2. Roll your shoulders up, then back, and then down in a smooth, circular motion.  3. Repeat 2 to 4 times.  Follow-up care is a key part of your treatment and safety. Be sure to make and go to all appointments, and call your doctor if you are having problems. It's also a good idea to know your test results and keep a list of the medicines you take.   Where can you  learn more?   Go to [email or web address] and sign in to your MyChart account. Enter V867 in the Search Health Information box to learn more about ???Neck Arthritis: Exercises.???    If you do not have an account, please click on the ???Sign Up Now??? link.     ?? 2006-2013 [employer]. Care instructions adapted under license by [facility]. This care instruction is for use with your licensed healthcare professional. If you have questions about a medical condition or this instruction, always ask your healthcare professional. [employer] disclaims any warranty or liability for your use of this information.  Content Version: 9.7.130178; Last Revised: January 07, 2010

## 2011-12-15 NOTE — Telephone Encounter (Signed)
Next appt 12/21/11

## 2011-12-16 MED ORDER — GLIMEPIRIDE 4 MG PO TABS
4 MG | ORAL_TABLET | Freq: Two times a day (BID) | ORAL | Status: DC
Start: 2011-12-16 — End: 2012-03-28

## 2011-12-21 MED ORDER — KETOCONAZOLE 2 % EX CREA
2 % | CUTANEOUS | Status: DC
Start: 2011-12-21 — End: 2012-07-26

## 2011-12-21 NOTE — Patient Instructions (Signed)
Diabetes Diet Guidelines: After Your Visit  Your Care Instructions  A healthy diet is important to manage diabetes. It helps keep your blood sugar at a target level (which you set with your doctor). A diet for diabetes does not mean that you have to eat special foods. You can eat what your family eats, including sweets once in a while. But you do have to pay attention to how often you eat and how much you eat of certain foods.  Managing the amount of carbohydrate you eat is an important part of a healthy diet for diabetes. Carbohydrate is found in sugar and sweets, grains, fruit, starchy vegetables, and milk and yogurt.  You may want to work with a dietitian or a certified diabetes educator (CDE) to help you plan meals and snacks. A dietitian or CDE can also help you lose weight if that is one of your goals.  Follow-up care is a key part of your treatment and safety. Be sure to make and go to all appointments, and call your doctor if you are having problems. It???s also a good idea to know your test results and keep a list of the medicines you take.  How can you care for yourself at home?  ?? Learn which foods have carbohydrate.  ?? Bread, cereal, pasta, and rice have about 15 grams of carbohydrate in a serving. A serving is 1 slice of bread (1 ounce), ?? cup of cooked cereal, or 1/3 cup of cooked pasta or rice.  ?? Fruits have 15 grams of carbohydrate in a serving. A serving is 1 small fresh fruit, such as an apple or orange; ?? of a banana; ?? cup of cooked or canned fruit; ?? cup of fruit juice; 1 cup of melon or raspberries; or 2 tablespoons of dried fruit.  ?? Milk and no-sugar-added yogurt have 15 grams of carbohydrate in a serving. A serving is 1 cup of milk or 2/3 cup of no-sugar-added yogurt.  ?? Starchy vegetables have 15 grams of carbohydrate in a serving. A serving is ?? cup of mashed potatoes or sweet potato; 1 cup winter squash; ?? of a small baked potato; 1/4 cup of cooked dried beans; or ?? cup cooked corn or  green peas.  ?? Learn how much carbohydrate to eat each day. A dietitian or CDE can teach you how to keep track of the amount of carbohydrate you eat. This is called carbohydrate counting.  ?? Try to eat about the same amount of carbohydrate at each meal. Your dietitian or CDE can tell you how much carbohydrate to eat at each meal and snack. Do not "save up" your daily allowance of carbohydrate to eat at one meal.  ?? If you are not sure how to count carbohydrate grams, use the Plate Method to plan meals. It is a good, quick way to make sure that you have a balanced meal. It also helps you spread carbohydrate throughout the day. Divide your plate by types of foods. Put vegetables on half the plate, meat or other protein food on one-quarter of the plate, and a grain or starchy vegetable (such as brown rice or a potato) in the final quarter of the plate. To this you can add a small piece of fruit and 1 cup of milk or yogurt, depending on how much carbohydrate you are supposed to eat at a meal.  ?? Limit saturated fat, such as the fat from meat and dairy products. Choose lean cuts of meat and nonfat or   low-fat dairy products. Use olive or canola oil instead of butter or shortening when cooking. This is a healthy choice because people who have diabetes are at higher-than-average risk of heart disease.  ?? Do not skip meals. Your blood sugar may drop too low if you skip meals and take certain diabetes pills or insulin.  ?? Work with your doctor to write up a sick-day plan for what to do on days when you are sick. Your blood sugar can go up or down depending on your illness and whether you can keep food down. Call your doctor when you are sick, to see if you need to adjust your pills or insulin.  ?? Check with your doctor before you drink alcohol. Alcohol can cause your blood sugar to drop too low. Alcohol can also cause a bad reaction if you take certain diabetes pills.   Where can you learn more?   Go to  [email or web address] and sign in to your MyChart account. Enter I147 in the Search Health Information box to learn more about ???Diabetes Diet Guidelines: After Your Visit.???    If you do not have an account, please click on the ???Sign Up Now??? link.     ?? 2006-2013 [employer]. Care instructions adapted under license by [facility]. This care instruction is for use with your licensed healthcare professional. If you have questions about a medical condition or this instruction, always ask your healthcare professional. [employer] disclaims any warranty or liability for your use of this information.  Content Version: 9.7.130178; Last Revised: February 14, 2010
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
## 2012-01-15 MED ORDER — SIMVASTATIN 10 MG PO TABS
10 MG | ORAL_TABLET | Freq: Every evening | ORAL | Status: DC
Start: 2012-01-15 — End: 2012-06-27

## 2012-01-15 MED ORDER — METFORMIN HCL 500 MG PO TABS
500 MG | ORAL_TABLET | Freq: Every day | ORAL | Status: DC
Start: 2012-01-15 — End: 2012-03-28

## 2012-02-13 ENCOUNTER — Telehealth

## 2012-02-13 NOTE — Telephone Encounter (Signed)
Printed, please fax

## 2012-02-13 NOTE — Telephone Encounter (Signed)
Patient called stating that  She received a letter from [facility], saying that she needs an order for a mammogram.

## 2012-03-04 MED ORDER — LISINOPRIL 10 MG PO TABS
10 MG | ORAL_TABLET | Freq: Every day | ORAL | Status: DC
Start: 2012-03-04 — End: 2012-06-27

## 2012-03-04 NOTE — Telephone Encounter (Signed)
Future 03/28/12

## 2012-03-08 MED ORDER — MELOXICAM 15 MG PO TABS
15 MG | ORAL_TABLET | Freq: Every day | ORAL | Status: DC
Start: 2012-03-08 — End: 2013-02-13

## 2012-03-08 NOTE — Patient Instructions (Addendum)
Hip Pain: After Your Visit  Your Care Instructions  Hip pain may be caused by many things, including overuse, a fall, or a twisting movement. Another cause of hip pain is arthritis. Your pain may increase when you stand up, walk, or squat. The pain may come and go or may be constant.  Home treatment can help relieve hip pain, swelling, and stiffness. If your pain is ongoing, you may need more tests and treatment.  Follow-up care is a key part of your treatment and safety. Be sure to make and go to all appointments, and call your doctor if you are having problems. It???s also a good idea to know your test results and keep a list of the medicines you take.  How can you care for yourself at home?  ?? Take pain medicines exactly as directed.  ?? If the doctor gave you a prescription medicine for pain, take it as prescribed.  ?? If you are not taking a prescription pain medicine, ask your doctor if you can take an over-the-counter medicine.  ?? Rest and protect your hip. Take a break from any activity, including standing or walking, that may cause pain.  ?? Put ice or a cold pack against your hip for 10 to 20 minutes at a time. Try to do this every 1 to 2 hours for the next 3 days (when you are awake) or until the swelling goes down. Put a thin cloth between the ice and your skin.  ?? Sleep on your healthy side with a pillow between your knees, or sleep on your back with pillows under your knees.  ?? If there is no swelling, you can put moist heat, a heating pad, or a warm cloth on your hip. Do gentle stretching exercises to help keep your hip flexible.  ?? Learn how to prevent falls. Have your vision and hearing checked regularly. Wear slippers or shoes with a nonskid sole.  ?? Stay at a healthy weight.  ?? Wear comfortable shoes.  When should you call for help?  Call 911 anytime you think you may need emergency care. For example, call if:  ?? You have sudden chest pain and shortness of breath, or you cough up blood.  ?? You are  not able to stand or walk or bear weight.  ?? Your buttocks, legs, or feet feel numb or tingly.  ?? Your leg or foot is cool or pale or changes color.  ?? You have severe pain.  Call your doctor now or seek immediate medical care if:  ?? You have signs of infection, such as:  ?? Increased pain, swelling, warmth, or redness in the hip area.  ?? Red streaks leading from the hip area.  ?? Pus draining from the hip area.  ?? A fever.  ?? You have signs of a blood clot, such as:  ?? Pain in your calf, back of the knee, thigh, or groin.  ?? Redness and swelling in your leg or groin.  ?? You are not able to bend, straighten, or move your leg normally.  ?? You have trouble urinating or having bowel movements.  Watch closely for changes in your health, and be sure to contact your doctor if:  ?? You do not get better as expected.   Where can you learn more?   Go to [email or web address] and sign in to your MyChart account. Enter Z720 in the Search Health Information box to learn more about ???Hip Pain: After Your Visit.???      If you do not have an account, please click on the ???Sign Up Now??? link.     ?? 2006-2013 [employer]. Care instructions adapted under license by [facility]. This care instruction is for use with your licensed healthcare professional. If you have questions about a medical condition or this instruction, always ask your healthcare professional. [employer] disclaims any warranty or liability for your use of this information.  Content Version: 9.7.130178; Last Revised: May 27, 2009              Hip Arthritis: Exercises  Your Care Instructions  Here are some examples of exercises for hip arthritis. Start each exercise slowly. Ease off the exercise if you start to have pain.  Your doctor or physical therapist will tell you when you can start these exercises and which ones will work best for you.  How to do the exercises  Straight-leg raises to the outside    1. Lie on  your side, with your affected hip on top.  2. Tighten the front thigh muscles of your top leg to keep your knee straight.  3. Keep your hip and your leg straight in line with the rest of your body, and keep your knee pointing forward. Do not drop your hip back.  4. Lift your top leg straight up toward the ceiling, about 12 inches off the floor. Hold for about 6 seconds, then slowly lower your leg.  5. Repeat 8 to 12 times.  6. Switch legs and repeat steps 1 through 5, even if only one hip is sore.  Straight-leg raises to the inside    1. Lie on your side with your affected hip on the floor.  2. You can either prop up your other leg on a chair, or you can bend that knee and put that foot in front of your other knee. Do not drop your hip back.  3. Tighten the muscles on the front thigh of your bottom leg to straighten that knee.  4. Keep your kneecap pointing forward and your leg straight, and lift your bottom leg up toward the ceiling about 6 inches. Hold for about 6 seconds, then lower slowly.  5. Repeat 8 to 12 times.  6. Switch legs and repeat steps 1 through 5, even if only one hip is sore.  Hip hike    1. Stand sideways on the bottom step of a staircase, and hold on to the banister or wall.  2. Keeping both knees straight, lift your good leg off the step and let it hang down. Then hike your good hip up to the same level as your affected hip or a little higher.  3. Repeat 8 to 12 times.  4. Switch legs and repeat steps 1 through 3, even if only one hip is sore.  Bridging    1. Lie on your back with both knees bent. Your knees should be bent about 90 degrees.  2. Then push your feet into the floor, squeeze your buttocks, and lift your hips off the floor until your shoulders, hips, and knees are all in a straight line.  3. Hold for about 6 seconds as you continue to breathe normally, and then slowly lower your hips back down to the floor and rest for up to 10 seconds.  4. Repeat 8 to 12 times.  Hamstring stretch  (lying down)    1. Lie flat on your back with your legs straight. If you feel discomfort in your back,  place a small towel roll under your lower back.  2. Holding the back of your affected leg, lift your leg straight up and toward your body until you feel a stretch at the back of your thigh.  3. Hold the stretch for at least 30 seconds.  4. Repeat 2 to 4 times.  5. Switch legs and repeat steps 1 through 4, even if only one hip is sore.  Standing quadriceps stretch    1. If you are not steady on your feet, hold on to a chair, counter, or wall. You can also lie on your stomach or your side to do this exercise.  2. Bend the knee of the leg you want to stretch, and reach behind you to grab the front of your foot or ankle with the hand on the same side. For example, if you are stretching your right leg, use your right hand.  3. Keeping your knees next to each other, pull your foot toward your buttock until you feel a gentle stretch across the front of your hip and down the front of your thigh. Your knee should be pointed directly to the ground, and not out to the side.  4. Hold the stretch for at least 15 to 30 seconds.  5. Repeat 2 to 4 times.  6. Switch legs and repeat steps 1 through 5, even if only one hip is sore.  Hip rotator stretch    1. Lie on your back with both knees bent and your feet flat on the floor.  2. Put the ankle of your affected leg on your opposite thigh near your knee.  3. Use your hand to gently push your knee away from your body until you feel a gentle stretch around your hip.  4. Hold the stretch for 15 to 30 seconds.  5. Repeat 2 to 4 times.  6. Repeat steps 1 through 5, but this time use your hand to gently pull your knee toward your opposite shoulder.  7. Switch legs and repeat steps 1 through 6, even if only one hip is sore.  Knee-to-chest    1. Lie on your back with your knees bent and your feet flat on the floor.  2. Bring your affected leg to your chest, keeping the other foot flat on the  floor (or keeping the other leg straight, whichever feels better on your lower back).  3. Keep your lower back pressed to the floor. Hold for at least 15 to 30 seconds.  4. Relax, and lower the knee to the starting position.  5. Repeat 2 to 4 times.  6. Switch legs and repeat steps 1 through 5, even if only one hip is sore.  7. To get more stretch, put your other leg flat on the floor while pulling your knee to your chest.  Clamshell    1. Lie on your side, with your affected hip on top. Keep your feet and knees together and your knees bent.  2. Raise your top knee, but keep your feet together. Do not let your hips roll back. Your legs should open up like a clamshell.  3. Hold for 6 seconds.  4. Slowly lower your knee back down. Rest for 10 seconds.  5. Repeat 8 to 12 times.  6. Switch legs and repeat steps 1 through 5, even if only one hip is sore.  Follow-up care is a key part of your treatment and safety. Be sure to make and go to all appointments, and  call your doctor if you are having problems. It's also a good idea to know your test results and keep a list of the medicines you take.   Where can you learn more?   Go to [email or web address] and sign in to your MyChart account. Enter [phone] in the Search Health Information box to learn more about ???Hip Arthritis: Exercises.???    If you do not have an account, please click on the ???Sign Up Now??? link.     ?? 2006-2013 [employer]. Care instructions adapted under license by [facility]. This care instruction is for use with your licensed healthcare professional. If you have questions about a medical condition or this instruction, always ask your healthcare professional. [employer] disclaims any warranty or liability for your use of this information.  Content Version: 9.7.130178; Last Revised: January 18, 2010              Sacroiliac Pain: Exercises  Your Care Instructions  Here are some examples of typical  rehabilitation exercises for your condition. Start each exercise slowly. Ease off the exercise if you start to have pain.  Your doctor or physical therapist will tell you when you can start these exercises and which ones will work best for you.  How to do the exercises  Knee-to-chest stretch    Do not do the knee-to-chest exercise if it causes or increases back or leg pain.  1. Lie on your back with your knees bent and your feet flat on the floor. You can put a small pillow under your head and neck if it is more comfortable.  2. Grasp your hands under one knee and bring the knee to your chest, keeping the other foot flat on the floor.  3. Keep your lower back pressed to the floor. Hold for at least 15 to 30 seconds.  4. Relax and lower the knee to the starting position. Repeat with the other leg.  5. Repeat 2 to 4 times with each leg.  6. To get more stretch, keep your other leg flat on the floor while pulling your knee to your chest.  Bridging    1. Lie on your back with both knees bent. Your knees should be bent about 90 degrees.  2. Tighten your belly muscles by pulling in your belly button toward your spine. Then push your feet into the floor, squeeze your buttocks, and lift your hips off the floor until your shoulders, hips, and knees are all in a straight line.  3. Hold for about 6 seconds as you continue to breathe normally, and then slowly lower your hips back down to the floor and rest for up to 10 seconds.  4. Repeat 8 to 12 times.  Hip extension    1. Get down on your hands and knees on the floor.  2. Keeping your back and neck straight, lift one leg straight out behind you. When you lift your leg, keep your hips level. Don't let your back twist, and don't let your hip drop toward the floor.  3. Hold for 6 seconds. Repeat 8 to 12 times with each leg.  4. If you feel steady and strong when you do this exercise, you can make it more difficult. To do this, when you lift your leg, also lift the opposite arm  straight out in front of you. For example, lift the left leg and the right arm at the same time. (This is sometimes called the "bird dog exercise.") Hold for 6  seconds, and repeat 8 to 12 times on each side.  Clamshell    1. Lie on your side with a pillow under your head. Keep your feet and knees together and your knees bent.  2. Raise your top knee, but keep your feet together. Do not let your hips roll back. Your legs should open up like a clamshell.  3. Hold for 6 seconds.  4. Slowly lower your knee back down. Rest for 10 seconds.  5. Repeat 8 to 12 times.  6. Switch to your other side and repeat steps 1 through 5.  Hamstring wall stretch    1. Lie on your back in a doorway, with one leg through the open door.  2. Slide your affected leg up the wall to straighten your knee. You should feel a gentle stretch down the back of your leg.  ?? Do not arch your back.  ?? Do not bend either knee.  ?? Keep one heel touching the floor and the other heel touching the wall. Do not point your toes.  3. Hold the stretch for at least 1 minute to begin. Then try to lengthen the time you hold the stretch to as long as 6 minutes.  4. Switch legs, and repeat steps 1 through 3.  5. Repeat 2 to 4 times.  If you do not have a place to do this exercise in a doorway, there is another way to do it:  1. Lie on your back, and bend one knee.  2. Loop a towel under the ball and toes of that foot, and hold the ends of the towel in your hands.  3. Straighten your knee, and slowly pull back on the towel. You should feel a gentle stretch down the back of your leg.  4. Switch legs, and repeat steps 1 through 3.  5. Repeat 2 to 4 times.  Lower abdominal strengthening    1. Lie on your back with your knees bent and your feet flat on the floor.  2. Tighten your belly muscles by pulling your belly button in toward your spine.  3. Lift one foot off the floor and bring your knee toward your chest, so that your knee is straight above your hip and your leg  is bent like the letter "L."  4. Lift the other knee up to the same position.  5. Lower one leg at a time to the starting position.  6. Keep alternating legs until you have lifted each leg 8 to 12 times.  7. Be sure to keep your belly muscles tight and your back still as you are moving your legs. Be sure to breathe normally.  Piriformis stretch    1. Lie on your back with your legs straight.  2. Lift your affected leg, and bend your knee. With your opposite hand, reach across your body, and then gently pull your knee toward your opposite shoulder.  3. Hold the stretch for 15 to 30 seconds.  4. Switch legs and repeat steps 1 through 3.  5. Repeat 2 to 4 times.  Follow-up care is a key part of your treatment and safety. Be sure to make and go to all appointments, and call your doctor if you are having problems. It's also a good idea to know your test results and keep a list of the medicines you take.   Where can you learn more?   Go to [email or web address] and sign in to your MyChart account. Enter [phone] in the Search  Health Information box to learn more about ???Sacroiliac Pain: Exercises.???    If you do not have an account, please click on the ???Sign Up Now??? link.     ?? 2006-2013 [employer]. Care instructions adapted under license by [facility]. This care instruction is for use with your licensed healthcare professional. If you have questions about a medical condition or this instruction, always ask your healthcare professional. [employer] disclaims any warranty or liability for your use of this information.  Content Version: 9.7.130178; Last Revised: June 17, 2010              Acute Low Back Pain: Exercises  Your Care Instructions  Here are some examples of typical rehabilitation exercises for your condition. Start each exercise slowly. Ease off the exercise if you start to have pain.  Your doctor or physical therapist will tell you when you can start these  exercises and which ones will work best for you.  When you are not being active, find a comfortable position for rest. Some people are comfortable on the floor or a medium-firm bed with a small pillow under their head and another under their knees. Some people prefer to lie on their side with a pillow between their knees. Don't stay in one position for too long.  Take short walks (10 to 20 minutes) every 2 to 3 hours. Avoid slopes, hills, and stairs until you feel better. Walk only distances you can manage without pain, especially leg pain.  How to do the exercises  Back stretches    1. Get down on your hands and knees on the floor.  2. Relax your head and allow it to droop. Round your back up toward the ceiling until you feel a nice stretch in your upper, middle, and lower back. Hold this stretch for as long as it feels comfortable, or about 15 to 30 seconds.  3. Return to the starting position with a flat back while you are on your hands and knees.  4. Let your back sway by pressing your stomach toward the floor. Lift your buttocks toward the ceiling.  5. Hold this position for 15 to 30 seconds.  6. Repeat 2 to 4 times.  Follow-up care is a key part of your treatment and safety. Be sure to make and go to all appointments, and call your doctor if you are having problems. It's also a good idea to know your test results and keep a list of the medicines you take.   Where can you learn more?   Go to [email or web address] and sign in to your MyChart account. Enter [phone] in the Search Health Information box to learn more about ???Acute Low Back Pain: Exercises.???    If you do not have an account, please click on the ???Sign Up Now??? link.     ?? 2006-2013 [employer]. Care instructions adapted under license by [facility]. This care instruction is for use with your licensed healthcare professional. If you have questions about a medical condition or this instruction, always ask your  healthcare professional. [employer] disclaims any warranty or liability for your use of this information.  Content Version: 9.7.130178; Last Revised: January 18, 2010

## 2012-03-08 NOTE — Progress Notes (Signed)
Subjective:      Patient ID: Marissa Sawyer is a 62 y.o. female who is having right leg    Leg Pain   The incident occurred more than 1 week ago. Incident location: fell out of a chair. The injury mechanism was a fall. The pain is present in the right hip and right leg. Quality: sharp. The pain is at a severity of 3/10. The pain is mild. The pain has been improving since onset. Associated symptoms include numbness (occasional) and tingling (occasional). Pertinent negatives include no inability to bear weight or loss of motion. She reports no foreign bodies present. Exacerbated by: not moving, gets stiff. She has tried NSAIDs for the symptoms. The treatment provided significant relief.     xrays reviewed, stable. dexa scan review, no osteoporosis.    Review of Systems   Constitutional: Negative for fever, chills and fatigue.   Gastrointestinal:        No loss of control of bowels or bladder   Musculoskeletal: Positive for back pain and arthralgias.   Neurological: Positive for tingling (occasional) and numbness (occasional). Negative for weakness.       Objective:   Physical Exam   Nursing note and vitals reviewed.  Constitutional: She is oriented to person, place, and time. She appears well-developed and well-nourished. No distress.   Musculoskeletal:        Left hip: She exhibits tenderness and bony tenderness. She exhibits normal range of motion, normal strength, no swelling, no crepitus, no deformity and no laceration.        Lumbar back: Normal. She exhibits no tenderness, no bony tenderness, no pain and no spasm.   Neurological: She is alert and oriented to person, place, and time.   Skin: Skin is warm and dry. She is not diaphoretic.   Psychiatric: She has a normal mood and affect. Her behavior is normal. Judgment and thought content normal.       Assessment:      1. Right hip pain  meloxicam (MOBIC) 15 MG tablet   2. Low back pain  meloxicam (MOBIC) 15 MG tablet            Plan:      Patient didn't want to do  PT at this time, multiple exercises given to do at home. mobic daily.
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
## 2012-03-15 MED ORDER — GUAIFENESIN-CODEINE 100-10 MG/5ML PO SYRP
100-10 MG/5ML | Freq: Two times a day (BID) | ORAL | Status: AC | PRN
Start: 2012-03-15 — End: 2012-03-22

## 2012-03-15 MED ORDER — GUAIFENESIN ER 600 MG PO TB12
600 MG | ORAL_TABLET | Freq: Two times a day (BID) | ORAL | Status: DC
Start: 2012-03-15 — End: 2014-02-18

## 2012-03-15 MED ORDER — BENZONATATE 100 MG PO CAPS
100 MG | ORAL_CAPSULE | Freq: Four times a day (QID) | ORAL | Status: AC | PRN
Start: 2012-03-15 — End: 2012-03-22

## 2012-03-15 MED ORDER — AZITHROMYCIN 250 MG PO TABS
250 MG | ORAL_TABLET | ORAL | Status: AC
Start: 2012-03-15 — End: 2012-03-25
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
## 2012-03-15 NOTE — Progress Notes (Signed)
Subjective:      Patient ID: Marissa Sawyer is a 62 y.o. female.    Cough  This is a new problem. The current episode started in the past 7 days. The problem has been gradually worsening. The problem occurs constantly. The cough is non-productive. Associated symptoms include chills, ear pain, headaches, myalgias, nasal congestion, postnasal drip, rhinorrhea and a sore throat. Pertinent negatives include no fever or shortness of breath. Nothing aggravates the symptoms. She has tried OTC cough suppressant for the symptoms. The treatment provided moderate relief. There is no history of asthma or COPD.       Review of Systems   Constitutional: Positive for chills and fatigue. Negative for fever.   HENT: Positive for ear pain, congestion, sore throat, rhinorrhea, postnasal drip and sinus pressure. Negative for sneezing.    Respiratory: Positive for cough. Negative for shortness of breath.    Musculoskeletal: Positive for myalgias.   Neurological: Positive for headaches.       Objective:   Physical Exam   Nursing note and vitals reviewed.  Constitutional: She is oriented to person, place, and time. She appears well-developed and well-nourished. No distress.   HENT:   Head: Normocephalic and atraumatic.   Right Ear: Hearing, tympanic membrane, external ear and ear canal normal.   Left Ear: Hearing, tympanic membrane, external ear and ear canal normal.   Nose: Nose normal. Right sinus exhibits no maxillary sinus tenderness and no frontal sinus tenderness. Left sinus exhibits no maxillary sinus tenderness and no frontal sinus tenderness.   Mouth/Throat: Uvula is midline and mucous membranes are normal. Posterior oropharyngeal edema and posterior oropharyngeal erythema present. No oropharyngeal exudate.   Neck: Neck supple.   Cardiovascular: Normal rate, regular rhythm and normal heart sounds.    Pulmonary/Chest: Effort normal and breath sounds normal.   Lymphadenopathy:     She has cervical adenopathy.   Neurological: She is  alert and oriented to person, place, and time.   Skin: Skin is warm and dry. She is not diaphoretic.   Psychiatric: She has a normal mood and affect. Her behavior is normal. Judgment and thought content normal.       Assessment:      1. Cough  guaiFENesin-codeine (TUSSI-ORGANIDIN NR) 100-10 MG/5ML syrup, benzonatate (TESSALON PERLES) 100 MG capsule, guaiFENesin (MUCINEX) 600 MG SR tablet   2. Nasal congestion  guaiFENesin (MUCINEX) 600 MG SR tablet   3. Pharyngitis  azithromycin (ZITHROMAX) 250 MG tablet           Plan:      Finish the entire abx  increase fluid intake, rest, good handwashing encouraged  Tylenol/motrin for pain/fever  No driving while taking controlled substances

## 2012-03-28 LAB — COMPREHENSIVE METABOLIC PANEL
ALT: 30 U/L
AST: 26 U/L
Albumin: 4.3
Alkaline Phosphatase: 95 U/L
BUN: 26 mg/dL
CO2: 28 mmol/L
Calcium: 9 mg/dL
Chloride: 106 mmol/L
Creatinine: 0.9
Gfr Calculated: 63
Glucose: 145 mg/dL
Potassium: 4.3 mmol/L
Sodium: 139 mmol/L
Total Bilirubin: 0.3 mg/dL (ref 0.1–1.4)
Total Protein: 8

## 2012-03-28 LAB — LIPID PANEL
Chol/HDL Ratio: 5.9
Cholesterol, Total: 206
HDL: 35 mg/dL (ref 35–70)
LDL Calculated: 138 mg/dL (ref 0–160)
Triglycerides: 166 mg/dL
VLDL: 33 mg/dL

## 2012-03-28 LAB — CBC WITH DIFFERENTIAL
Hematocrit: 38.2 % (ref 36–46)
Hemoglobin: 13 g/dL (ref 12.0–16.0)
Platelets: 289 K/??L
WBC: 6.7 10^3/mL

## 2012-03-28 LAB — VITAMIN D 25 HYDROXY: Vit D, 25-Hydroxy: 15

## 2012-03-28 LAB — HEMOGLOBIN A1C: Hemoglobin A1C: 8.2 %

## 2012-03-28 MED ORDER — GLIMEPIRIDE 4 MG PO TABS
4 MG | ORAL_TABLET | Freq: Two times a day (BID) | ORAL | Status: DC
Start: 2012-03-28 — End: 2012-06-27

## 2012-03-28 MED ORDER — METFORMIN HCL 500 MG PO TABS
500 MG | ORAL_TABLET | Freq: Two times a day (BID) | ORAL | Status: DC
Start: 2012-03-28 — End: 2012-06-27

## 2012-03-28 MED ORDER — PEG 3350-KCL-NABCB-NACL-NASULF 236 G PO SOLR
236 | Freq: Once | ORAL | Status: AC
Start: 2012-03-28 — End: 2012-03-28

## 2012-03-28 NOTE — Patient Instructions (Signed)
Diabetes Diet Guidelines: After Your Visit  Your Care Instructions  A healthy diet is important to manage diabetes. It helps keep your blood sugar at a target level (which you set with your doctor). A diet for diabetes does not mean that you have to eat special foods. You can eat what your family eats, including sweets once in a while. But you do have to pay attention to how often you eat and how much you eat of certain foods.  Managing the amount of carbohydrate you eat is an important part of a healthy diet for diabetes. Carbohydrate is found in sugar and sweets, grains, fruit, starchy vegetables, and milk and yogurt.  You may want to work with a dietitian or a certified diabetes educator (CDE) to help you plan meals and snacks. A dietitian or CDE can also help you lose weight if that is one of your goals.  Follow-up care is a key part of your treatment and safety. Be sure to make and go to all appointments, and call your doctor if you are having problems. It???s also a good idea to know your test results and keep a list of the medicines you take.  How can you care for yourself at home?  ?? Learn which foods have carbohydrate.  ?? Bread, cereal, pasta, and rice have about 15 grams of carbohydrate in a serving. A serving is 1 slice of bread (1 ounce), ?? cup of cooked cereal, or 1/3 cup of cooked pasta or rice.  ?? Fruits have 15 grams of carbohydrate in a serving. A serving is 1 small fresh fruit, such as an apple or orange; ?? of a banana; ?? cup of cooked or canned fruit; ?? cup of fruit juice; 1 cup of melon or raspberries; or 2 tablespoons of dried fruit.  ?? Milk and no-sugar-added yogurt have 15 grams of carbohydrate in a serving. A serving is 1 cup of milk or 2/3 cup of no-sugar-added yogurt.  ?? Starchy vegetables have 15 grams of carbohydrate in a serving. A serving is ?? cup of mashed potatoes or sweet potato; 1 cup winter squash; ?? of a small baked potato; 1/4 cup of cooked dried beans; or ?? cup cooked corn or  green peas.  ?? Learn how much carbohydrate to eat each day. A dietitian or CDE can teach you how to keep track of the amount of carbohydrate you eat. This is called carbohydrate counting.  ?? Try to eat about the same amount of carbohydrate at each meal. Your dietitian or CDE can tell you how much carbohydrate to eat at each meal and snack. Do not "save up" your daily allowance of carbohydrate to eat at one meal.  ?? If you are not sure how to count carbohydrate grams, use the Plate Method to plan meals. It is a good, quick way to make sure that you have a balanced meal. It also helps you spread carbohydrate throughout the day. Divide your plate by types of foods. Put vegetables on half the plate, meat or other protein food on one-quarter of the plate, and a grain or starchy vegetable (such as brown rice or a potato) in the final quarter of the plate. To this you can add a small piece of fruit and 1 cup of milk or yogurt, depending on how much carbohydrate you are supposed to eat at a meal.  ?? Limit saturated fat, such as the fat from meat and dairy products. Choose lean cuts of meat and nonfat or   low-fat dairy products. Use olive or canola oil instead of butter or shortening when cooking. This is a healthy choice because people who have diabetes are at higher-than-average risk of heart disease.  ?? Do not skip meals. Your blood sugar may drop too low if you skip meals and take certain diabetes pills or insulin.  ?? Work with your doctor to write up a sick-day plan for what to do on days when you are sick. Your blood sugar can go up or down depending on your illness and whether you can keep food down. Call your doctor when you are sick, to see if you need to adjust your pills or insulin.  ?? Check with your doctor before you drink alcohol. Alcohol can cause your blood sugar to drop too low. Alcohol can also cause a bad reaction if you take certain diabetes pills.   Where can you learn more?   Go to  [email or web address] and sign in to your MyChart account. Enter I147 in the Search Health Information box to learn more about ???Diabetes Diet Guidelines: After Your Visit.???    If you do not have an account, please click on the ???Sign Up Now??? link.     ?? 2006-2013 [employer]. Care instructions adapted under license by [facility]. This care instruction is for use with your licensed healthcare professional. If you have questions about a medical condition or this instruction, always ask your healthcare professional. [employer] disclaims any warranty or liability for your use of this information.  Content Version: 9.7.130178; Last Revised: February 14, 2010

## 2012-03-28 NOTE — Telephone Encounter (Signed)
Please send [facility] bowel prep to the above pharmacy.  Thank you.

## 2012-03-28 NOTE — Telephone Encounter (Signed)
Per routine order of Dr Murtis SinkPangulur colonoscopy prep sent to pharmacy.  Patient has history of hyperplastic polyps removed from the stomach, will review paper chart with Dr Murtis SinkPangulur.
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
## 2012-04-01 NOTE — Telephone Encounter (Signed)
Discussed with Dr Murtis SinkPangulur, he orders EGD to be done when colonoscopy is done.  Cathryn, please add EGD.  Thank you, Coy Saunasosemary

## 2012-04-02 NOTE — Telephone Encounter (Signed)
Colon is sched 04-17-12.  Are you wanting me to add EGD to this procedure?  Or schedule a separate procedure for later?

## 2012-04-02 NOTE — Telephone Encounter (Signed)
Pt called back returning your phonecall. Please call pt back. Call her at work, will need to ask for her.

## 2012-04-02 NOTE — Telephone Encounter (Signed)
Call patient and inform her that she needs EGD and colonoscopy per Dr Murtis SinkPangulur and she agrees.

## 2012-04-02 NOTE — Telephone Encounter (Signed)
Marissa Evansatherine, I called her she agrees to both EGD/colonoscopy, but the day she is scheduled is not going to work for her, Please call her and schedule EGD/colonoscopy on the same day.  Thank you, Coy Saunasosemary

## 2012-04-25 LAB — SURGICAL PATHOLOGY

## 2012-05-17 NOTE — Progress Notes (Signed)
Subjective:      Patient ID: Marissa Sawyer is a 62 y.o. female.    HPI  Marissa Sawyer seen in the office for follow up of GERD and loose bowels.  This patient had an EGD and colonoscopy done recently.  The colonoscopy revealed that she has diverticulosis and there were no polyps, malignancy, colitis, or ileitis seen.  However, EGD did reveal small polyp in the lower part of the body of the stomach which was excised used cold biopsy forceps.  Surprisingly, this turned out to be tubular adenoma.  This was explained to the patient and the patient needs to be followed and possibly may need a repeat EGD in the next year or so to evaluate this area.  Also at present, my feeling is that most of this lesion came out.  Also discussed with the patient regarding GERD symptoms management.  She is advised antireflux measures and also to loose weight.  As far as the loose bowels are concerned, this patient has mild IBS.  This was explained to the patient.  Discussed regarding IBS and long-term management.  The patient will be seen in the next year and a half.  The patient understood and agreed.    Review of Systems   Constitutional: Negative for fever, appetite change, fatigue and unexpected weight change.   HENT: Negative for sore throat, mouth sores, trouble swallowing and voice change.    Eyes: Negative.    Respiratory: Negative for cough, choking, shortness of breath and wheezing.    Cardiovascular: Negative for chest pain, palpitations and leg swelling.   Gastrointestinal: Positive for diarrhea and constipation. Negative for nausea, vomiting, abdominal pain, blood in stool and abdominal distention.   Endocrine: Negative for polyphagia and polyuria.   Genitourinary: Negative for urgency, frequency, hematuria, vaginal bleeding, difficulty urinating and pelvic pain.   Musculoskeletal: Negative for back pain, joint swelling, arthralgias and gait problem.   Skin: Negative for color change, pallor and rash.   Allergic/Immunologic:  Negative for food allergies.   Neurological: Negative for dizziness, seizures, weakness, light-headedness and headaches.   Hematological: Negative for adenopathy. Does not bruise/bleed easily.   Psychiatric/Behavioral: Negative for sleep disturbance. The patient is not nervous/anxious.        Objective:   Physical Exam   Nursing note and vitals reviewed.  Constitutional: She is oriented to person, place, and time. She appears well-developed and well-nourished.   HENT:   Head: Normocephalic and atraumatic.   No oral lesions   Eyes: Conjunctivae are normal. Pupils are equal, round, and reactive to light. No scleral icterus.   Neck: Normal range of motion. Neck supple. No hepatojugular reflux and no JVD present. No tracheal deviation present. No thyromegaly present.   Cardiovascular: Normal rate, regular rhythm, normal heart sounds and intact distal pulses.    Pulmonary/Chest: Effort normal and breath sounds normal. No respiratory distress. She has no wheezes. She has no rales.   Abdominal: Soft. Bowel sounds are normal. She exhibits no distension, no ascites and no mass. There is no hepatomegaly. There is no tenderness. There is no rebound. No hernia.   Musculoskeletal: She exhibits no edema and no tenderness.   No joint swelling   Lymphadenopathy:     She has no cervical adenopathy.   Neurological: She is alert and oriented to person, place, and time. No cranial nerve deficit.   Skin: Skin is warm. No bruising, no ecchymosis and no rash noted. No erythema.   Psychiatric: Thought content normal.  Assessment:      1. Anxiety    2. GERD (gastroesophageal reflux disease)    3. OA (osteoarthritis)    4. Tubular adenoma            Plan:    As discussed in HPI

## 2012-06-27 MED ORDER — BUSPIRONE HCL 10 MG PO TABS
10 MG | ORAL_TABLET | Freq: Three times a day (TID) | ORAL | Status: DC | PRN
Start: 2012-06-27 — End: 2012-10-03

## 2012-06-27 MED ORDER — CLOTRIMAZOLE-BETAMETHASONE 1-0.05 % EX CREA
CUTANEOUS | Status: DC
Start: 2012-06-27 — End: 2013-06-11

## 2012-06-27 MED ORDER — HYDROXYZINE HCL 25 MG PO TABS
25 MG | ORAL_TABLET | Freq: Three times a day (TID) | ORAL | Status: AC | PRN
Start: 2012-06-27 — End: 2012-07-27

## 2012-06-27 MED ORDER — LISINOPRIL 10 MG PO TABS
10 MG | ORAL_TABLET | Freq: Every day | ORAL | Status: DC
Start: 2012-06-27 — End: 2012-10-03

## 2012-06-27 MED ORDER — SIMVASTATIN 10 MG PO TABS
10 MG | ORAL_TABLET | Freq: Every evening | ORAL | Status: DC
Start: 2012-06-27 — End: 2012-10-03

## 2012-06-27 MED ORDER — GLIMEPIRIDE 4 MG PO TABS
4 MG | ORAL_TABLET | Freq: Two times a day (BID) | ORAL | Status: DC
Start: 2012-06-27 — End: 2012-10-03

## 2012-06-27 MED ORDER — METFORMIN HCL 500 MG PO TABS
500 MG | ORAL_TABLET | Freq: Two times a day (BID) | ORAL | Status: DC
Start: 2012-06-27 — End: 2014-03-10

## 2012-06-27 NOTE — Patient Instructions (Signed)
Diabetes Diet Guidelines: After Your Visit  Your Care Instructions  A healthy diet is important to manage diabetes. It helps keep your blood sugar at a target level (which you set with your doctor). A diet for diabetes does not mean that you have to eat special foods. You can eat what your family eats, including sweets once in a while. But you do have to pay attention to how often you eat and how much you eat of certain foods.  Managing the amount of carbohydrate you eat is an important part of a healthy diet for diabetes. Carbohydrate is found in sugar and sweets, grains, fruit, starchy vegetables, and milk and yogurt.  You may want to work with a dietitian or a certified diabetes educator (CDE) to help you plan meals and snacks. A dietitian or CDE can also help you lose weight if that is one of your goals.  Follow-up care is a key part of your treatment and safety. Be sure to make and go to all appointments, and call your doctor if you are having problems. It???s also a good idea to know your test results and keep a list of the medicines you take.  How can you care for yourself at home?  ?? Learn which foods have carbohydrate.  ?? Bread, cereal, pasta, and rice have about 15 grams of carbohydrate in a serving. A serving is 1 slice of bread (1 ounce), ?? cup of cooked cereal, or 1/3 cup of cooked pasta or rice.  ?? Fruits have 15 grams of carbohydrate in a serving. A serving is 1 small fresh fruit, such as an apple or orange; ?? of a banana; ?? cup of cooked or canned fruit; ?? cup of fruit juice; 1 cup of melon or raspberries; or 2 tablespoons of dried fruit.  ?? Milk and no-sugar-added yogurt have 15 grams of carbohydrate in a serving. A serving is 1 cup of milk or 2/3 cup of no-sugar-added yogurt.  ?? Starchy vegetables have 15 grams of carbohydrate in a serving. A serving is ?? cup of mashed potatoes or sweet potato; 1 cup winter squash; ?? of a small baked potato; 1/4 cup of cooked dried beans; or ?? cup cooked corn or  green peas.  ?? Learn how much carbohydrate to eat each day. A dietitian or CDE can teach you how to keep track of the amount of carbohydrate you eat. This is called carbohydrate counting.  ?? Try to eat about the same amount of carbohydrate at each meal. Your dietitian or CDE can tell you how much carbohydrate to eat at each meal and snack. Do not "save up" your daily allowance of carbohydrate to eat at one meal.  ?? If you are not sure how to count carbohydrate grams, use the Plate Method to plan meals. It is a good, quick way to make sure that you have a balanced meal. It also helps you spread carbohydrate throughout the day. Divide your plate by types of foods. Put vegetables on half the plate, meat or other protein food on one-quarter of the plate, and a grain or starchy vegetable (such as brown rice or a potato) in the final quarter of the plate. To this you can add a small piece of fruit and 1 cup of milk or yogurt, depending on how much carbohydrate you are supposed to eat at a meal.  ?? Limit saturated fat, such as the fat from meat and dairy products. Choose lean cuts of meat and nonfat or   low-fat dairy products. Use olive or canola oil instead of butter or shortening when cooking. This is a healthy choice because people who have diabetes are at higher-than-average risk of heart disease.  ?? Do not skip meals. Your blood sugar may drop too low if you skip meals and take certain diabetes pills or insulin.  ?? Work with your doctor to write up a sick-day plan for what to do on days when you are sick. Your blood sugar can go up or down depending on your illness and whether you can keep food down. Call your doctor when you are sick, to see if you need to adjust your pills or insulin.  ?? Check with your doctor before you drink alcohol. Alcohol can cause your blood sugar to drop too low. Alcohol can also cause a bad reaction if you take certain diabetes pills.   Where can you learn more?   Go to  [email or web address] and sign in to your MyChart account. Enter I147 in the Search Health Information box to learn more about ???Diabetes Diet Guidelines: After Your Visit.???    If you do not have an account, please click on the ???Sign Up Now??? link.     ?? 2006-2013 [employer]. Care instructions adapted under license by [facility]. This care instruction is for use with your licensed healthcare professional. If you have questions about a medical condition or this instruction, always ask your healthcare professional. [employer] disclaims any warranty or liability for your use of this information.  Content Version: 9.9.209917; Last Revised: February 14, 2010

## 2012-06-27 NOTE — Progress Notes (Signed)
Patient is here for 3 month f/u on dm, htn. Her blood sugar was 132 today.      Have you seen any other physician or provider since your last visit yes - Dr. Marisue Ivan    Have you had any other diagnostic tests since your last visit? yes - colonoscopy,EGD    BP Readings from Last 2 Encounters:   05/16/12 144/83   03/28/12 136/82        Hemoglobin A1C (%)   Date Value   03/28/2012 8.2         Microalb/Crt. Ratio (mcg/mg creat)   Date Value   12/09/2010 Unable to calculate or report.         LDL Calculated (mg/dL)   Date Value   04/19/863 138         HDL (mg/dL)   Date Value   7/84/6962 35         Sodium (mmol/L)   Date Value   03/28/2012 139         Potassium (mmol/L)   Date Value   03/28/2012 4.3         Chloride (mmol/L)   Date Value   03/28/2012 106         CO2 (mmol/L)   Date Value   03/28/2012 28         BUN (mg/dL)   Date Value   9/52/8413 26         CREATININE (no units)   Date Value   03/28/2012 0.9         Creatinine (mg/dL)   Date Value   2/44/0102 0.75         Glucose (mg/dL)   Date Value   09/28/3662 145    03/29/2010 121*        Calcium (mg/dL)   Date Value   06/06/4740 9.0             Are you taking any over-the-counter medicines, vitamins, or herbal medicines? no     Are you taking any medications for Diabetes?    Yes   If yes, see medication list     Are you having any side effects from Diabetes medications?   No    Is Daily aspirin on medication list?:  No  If not and if the patient also has a history of vascular disease or is at increased risk of cardiovascular disease, the patient may be a candidate for daily aspirin. Discuss with physician.     Are you taking any medications for hypertension?    Yes   If yes, see medication list     Are you having any side effects from hypertension medications?   No    Tobacco use:  Patient  reports that she has never smoked. She has never used smokeless tobacco.    Reminders - reviewed with patient today - Yes  ?? Tobacco -  If patient uses tobacco and intervention is done  today - remember to mark counseling in the vital signs section and to give educational materials.   ?? Foot Exam - If monofilament exam has not bee done within the past year, prepare the patient and have the testing equipment ready.   ?? Eye Exam - Ask about diabetic retinal (eye) exam and record in health maintenance.  ?? BP - If BP >140/90, repeat reading. Check cuff size.    ?? Microalbumin urine test - If not performed in the last year, notify physician. Enter order in pending status if  your physician desires.       Patient Self-Management Goal for Chronic Condition  Goal: I will take all medications as prescribed by my doctor, and I will call the office if I am having any medication problems.  Barriers to success: none  Plan for overcoming my barriers: N/A     Confidence: 7/10  Date goal set: 06/27/2012  Date expected to reach goal: 06/28/2013      Subjective:      Marissa Sawyer is here for follow up of elevated cholesterol, elevated blood pressure, and diabetes. Compliance with treatment has been good. Patient denies muscle pain associated with her medications. She is not exercising and is adherent to a low-salt diet.  Blood pressure is well controlled at home. Cardiac symptoms: fatigue. Patient denies: chest pain, palpitations and syncope. Cardiovascular risk factors: diabetes mellitus, dyslipidemia, hypertension and obesity (BMI >= 30 kg/m2). Use of agents associated with hypertension: NSAIDS. History of target organ damage: none. Current diabetic symptoms include: hyperglycemia. Patient denies foot ulcerations, nausea and vomiting. Evaluation to date has included: fasting blood sugar, fasting lipid panel, hemoglobin A1C and microalbuminuria. Home sugars: BGs are running  consistent with Hgb A1C. Current treatments: Continued sulfonylurea which has been effective, Continued metformin which has been effective, Continued statin which has been effective and Continued ACE inhibitor/ARB which has been effective. Last  dilated eye exam within the past year.    Patient's medications, allergies, past medical, surgical, social and family histories were reviewed and updated as appropriate.    Review of Systems  A comprehensive review of systems was negative except for: Eyes: positive for contacts/glasses and visual disturbance  Gastrointestinal: positive for reflux symptoms  Integument/breast: positive for dryness, pruritus, rash and skin lesion(s)  Musculoskeletal: positive for arthralgias, myalgias and stiff joints  Behavioral/Psych: positive for anxiety and obesity   12 of 15 ros positive as above     Objective:      BP 100/70   Temp(Src) 97.5 ??F (36.4 ??C) (Oral)   Wt 179 lb 6.4 oz (81.375 kg)   BMI 30.78 kg/m2   Breastfeeding? No    General Appearance:    Alert, cooperative, no distress, appears stated age   Head:    Normocephalic, without obvious abnormality, atraumatic   Eyes:    PERRL, conjunctiva/corneas clear, EOM's intact, fundi     benign, both eyes   Ears:    Normal TM's and external ear canals, both ears   Nose:   Nares normal, septum midline, mucosa normal, no drainage    or sinus tenderness   Throat:   Lips, mucosa, and tongue normal; teeth and gums normal   Neck:   Supple, symmetrical, trachea midline, no adenopathy;     thyroid:  no enlargement/tenderness/nodules; no carotid    bruit or JVD   Back:     Symmetric, no curvature, ROM normal, no CVA tenderness   Lungs:     Clear to auscultation bilaterally, respirations unlabored   Chest Wall:    No tenderness or deformity    Heart:    Regular rate and rhythm, S1 and S2 normal, no murmur, rub   or gallop   Breast Exam:    Deferred   Abdomen:     Soft, non-tender, bowel sounds active all four quadrants,     no masses, no organomegaly   Genitalia:    Deferred    Rectal:    Deferred   Extremities:   Extremities normal, atraumatic, no cyanosis or edema, multiple  joints arthritic changes   Pulses:   2+ and symmetric all extremities   Skin:   Skin color, texture, turgor normal,  no rashes or lesions   Lymph nodes:   Cervical, supraclavicular, and axillary nodes normal   Neurologic:   CNII-XII intact, normal strength, sensation and reflexes     throughout       Lab Review  Lab Results   Component Value Date    CHOL 206 03/28/2012    CHOL 160 05/30/2011    CHOL 207 12/09/2010    CHOL 203 03/29/2010    HDL 35 03/28/2012    HDL 36 05/30/2011    HDL 40 12/09/2010         Assessment:      Dyslipidemia under adequate control.  Hypertension, normal blood pressure . Evidence of target organ damage: none.  Diabetes mellitus Type II, under adequate control.  1. Type II or unspecified type diabetes mellitus without mention of complication, not stated as uncontrolled  Hemoglobin A1c    glimepiride (AMARYL) 4 MG tablet    metFORMIN (GLUCOPHAGE) 500 MG tablet    Microalbumin, Ur   2. HTN (hypertension)  lisinopril (PRINIVIL;ZESTRIL) 10 MG tablet   3. Hyperlipidemia  simvastatin (ZOCOR) 10 MG tablet    Lipid panel   4. Anxiety  busPIRone (BUSPAR) 10 MG tablet    hydrOXYzine (ATARAX) 25 MG tablet   5. Tinea corporis  clotrimazole-betamethasone (LOTRISONE) cream   6. GERD (gastroesophageal reflux disease)     7. OA (osteoarthritis)     8. Pruritus  hydrOXYzine (ATARAX) 25 MG tablet   9. Obesity (BMI 30.0-34.9)               Plan:      1. Continue dietary measures.  2. Continue regular exercise.  3. Lipid-lowering medications: Zocor.  4. Counseling at today's visit: discussed the need for weight loss, focused on the need for regular aerobic exercise, discussed the advantages of a diet low in carbohydrates and discussed sick day management.  Reminded to get yearly retinal exam.  Labs: see orders.  Complete orders as above.  5. Follow up in 3 months or prn.  6. Continue current meds.  7. Spent 15 mins answering all questions.       Marissa Sawyer received counseling on the following healthy behaviors: nutrition    Patient given Transport planner on nutrition    Was a self-tracking handout given in paper form or via MyChart?  No  If yes, see orders or list here.    Discussed use, benefit, and side effects of prescribed medications.  Barriers to medication compliance addressed.  All patient questions answered.  Pt voiced understanding.

## 2012-07-05 LAB — HEMOGLOBIN A1C: Hemoglobin A1C: 6.7 %

## 2012-07-10 ENCOUNTER — Encounter

## 2012-07-11 LAB — LIPID PANEL
Chol/HDL Ratio: 5
Cholesterol, Total: 195
HDL: 39 mg/dL (ref 35–70)
LDL Calculated: 127 mg/dL (ref 0–160)
Triglycerides: 147 mg/dL
VLDL: 29 mg/dL

## 2012-07-12 ENCOUNTER — Encounter

## 2012-07-19 ENCOUNTER — Telehealth

## 2012-07-19 MED ORDER — DAPAGLIFLOZIN PROPANEDIOL 5 MG PO TABS
5 MG | ORAL_TABLET | Freq: Every morning | ORAL | Status: DC
Start: 2012-07-19 — End: 2012-10-03
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
## 2012-07-26 ENCOUNTER — Encounter

## 2012-07-26 MED ORDER — KETOCONAZOLE 2 % EX CREA
2 % | CUTANEOUS | Status: DC
Start: 2012-07-26 — End: 2014-02-18

## 2012-07-26 NOTE — Telephone Encounter (Signed)
Pharmacy called stating that the patient needs a new script for this cream, they said she had one script left but it was in hold for too long.

## 2012-09-09 MED ORDER — LISINOPRIL 10 MG PO TABS
10 MG | ORAL_TABLET | ORAL | Status: DC
Start: 2012-09-09 — End: 2013-02-13

## 2012-09-16 MED ORDER — FLUCONAZOLE 150 MG PO TABS
150 MG | ORAL_TABLET | Freq: Once | ORAL | Status: AC
Start: 2012-09-16 — End: 2012-09-16
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
## 2012-09-16 NOTE — Telephone Encounter (Signed)
done

## 2012-10-03 MED ORDER — DAPAGLIFLOZIN PROPANEDIOL 5 MG PO TABS
5 MG | ORAL_TABLET | Freq: Every morning | ORAL | Status: DC
Start: 2012-10-03 — End: 2013-04-17

## 2012-10-03 MED ORDER — BUSPIRONE HCL 10 MG PO TABS
10 MG | ORAL_TABLET | Freq: Three times a day (TID) | ORAL | Status: DC | PRN
Start: 2012-10-03 — End: 2014-03-10

## 2012-10-03 MED ORDER — GLIMEPIRIDE 4 MG PO TABS
4 MG | ORAL_TABLET | Freq: Two times a day (BID) | ORAL | Status: DC
Start: 2012-10-03 — End: 2013-08-20

## 2012-10-03 MED ORDER — LISINOPRIL 10 MG PO TABS
10 MG | ORAL_TABLET | Freq: Every day | ORAL | Status: DC
Start: 2012-10-03 — End: 2013-04-16

## 2012-10-03 MED ORDER — SIMVASTATIN 10 MG PO TABS
10 MG | ORAL_TABLET | Freq: Every evening | ORAL | Status: DC
Start: 2012-10-03 — End: 2013-09-21

## 2012-10-03 NOTE — Patient Instructions (Signed)
Diabetes Diet Guidelines: After Your Visit  Your Care Instructions  A healthy diet is important to manage diabetes. It helps keep your blood sugar at a target level (which you set with your doctor). A diet for diabetes does not mean that you have to eat special foods. You can eat what your family eats, including sweets once in a while. But you do have to pay attention to how often you eat and how much you eat of certain foods.  Managing the amount of carbohydrate you eat is an important part of a healthy diet for diabetes. Carbohydrate is found in sugar and sweets, grains, fruit, starchy vegetables, and milk and yogurt.  You may want to work with a dietitian or a certified diabetes educator (CDE) to help you plan meals and snacks. A dietitian or CDE can also help you lose weight if that is one of your goals.  Follow-up care is a key part of your treatment and safety. Be sure to make and go to all appointments, and call your doctor if you are having problems. It???s also a good idea to know your test results and keep a list of the medicines you take.  How can you care for yourself at home?  ?? Learn which foods have carbohydrate.  ?? Bread, cereal, pasta, and rice have about 15 grams of carbohydrate in a serving. A serving is 1 slice of bread (1 ounce), ?? cup of cooked cereal, or 1/3 cup of cooked pasta or rice.  ?? Fruits have 15 grams of carbohydrate in a serving. A serving is 1 small fresh fruit, such as an apple or orange; ?? of a banana; ?? cup of cooked or canned fruit; ?? cup of fruit juice; 1 cup of melon or raspberries; or 2 tablespoons of dried fruit.  ?? Milk and no-sugar-added yogurt have 15 grams of carbohydrate in a serving. A serving is 1 cup of milk or 2/3 cup of no-sugar-added yogurt.  ?? Starchy vegetables have 15 grams of carbohydrate in a serving. A serving is ?? cup of mashed potatoes or sweet potato; 1 cup winter squash; ?? of a small baked potato; 1/4 cup of cooked dried beans; or ?? cup cooked corn or  green peas.  ?? Learn how much carbohydrate to eat each day. A dietitian or CDE can teach you how to keep track of the amount of carbohydrate you eat. This is called carbohydrate counting.  ?? Try to eat about the same amount of carbohydrate at each meal. Your dietitian or CDE can tell you how much carbohydrate to eat at each meal and snack. Do not "save up" your daily allowance of carbohydrate to eat at one meal.  ?? If you are not sure how to count carbohydrate grams, use the Plate Method to plan meals. It is a good, quick way to make sure that you have a balanced meal. It also helps you spread carbohydrate throughout the day. Divide your plate by types of foods. Put vegetables on half the plate, meat or other protein food on one-quarter of the plate, and a grain or starchy vegetable (such as brown rice or a potato) in the final quarter of the plate. To this you can add a small piece of fruit and 1 cup of milk or yogurt, depending on how much carbohydrate you are supposed to eat at a meal.  ?? Limit saturated fat, such as the fat from meat and dairy products. Choose lean cuts of meat and nonfat or   low-fat dairy products. Use olive or canola oil instead of butter or shortening when cooking. This is a healthy choice because people who have diabetes are at higher-than-average risk of heart disease.  ?? Do not skip meals. Your blood sugar may drop too low if you skip meals and take certain diabetes pills or insulin.  ?? Work with your doctor to write up a sick-day plan for what to do on days when you are sick. Your blood sugar can go up or down depending on your illness and whether you can keep food down. Call your doctor when you are sick, to see if you need to adjust your pills or insulin.  ?? Check with your doctor before you drink alcohol. Alcohol can cause your blood sugar to drop too low. Alcohol can also cause a bad reaction if you take certain diabetes pills.   Where can you learn more?   Go to  [email or web address] and sign in to your MyChart account. Enter I147 in the Search Health Information box to learn more about ???Diabetes Diet Guidelines: After Your Visit.???    If you do not have an account, please click on the ???Sign Up Now??? link.     ?? 2006-2014 [employer]. Care instructions adapted under license by [facility]. This care instruction is for use with your licensed healthcare professional. If you have questions about a medical condition or this instruction, always ask your healthcare professional. [employer] Inc[employer] disclaims any warranty or liability for your use of this information.  Content Version: 10.0.273164; Last Revised: February 14, 2010

## 2012-10-03 NOTE — Progress Notes (Signed)
Patient is here for 3 month f/u on htn, dm, dyslipidemia. Should patient be taking all three blood sugar medication. Check ears. Patient also sometimes having pain on Rt side of back. Blood sugar was 115 this morning.      Have you seen any other physician or provider since your last visit no    Have you had any other diagnostic tests since your last visit? no    Have you changed or stopped any medications since your last visit including any over-the-counter medicines, vitamins, or herbal medicines? no     Are you taking all your prescribed medications? Yes  If NO, why? -  N/A           Patient Self-Management Goal for this visit.   What is your goal for your visit today? - n/a   Barriers to success: none   Plan for overcoming my barriers: N/A      Confidence: 10/10   Date goal set: 10/03/2012   Date expected to reach goal: 1week    Medical history Review  Past Medical, Family, and Social History reviewed and does not contribute to the patient presenting condition    No health maintenance topics applied.    Subjective:      [name] is here for follow up of elevated cholesterol, elevated blood pressure, and diabetes. Compliance with treatment has been adequate. Patient denies muscle pain associated with her medications. She is somewhat exercising and is usually adherent to a low-salt diet.  Blood pressure is mostly well controlled at home. Cardiac symptoms: none. Patient denies: chest pain, lower extremity edema, palpitations and syncope. Cardiovascular risk factors: diabetes mellitus, dyslipidemia and hypertension. Use of agents associated with hypertension: NSAIDS. History of target organ damage: none. Current diabetic symptoms include: none. Patient denies foot ulcerations, nausea and vomiting. Evaluation to date has included: fasting blood sugar, fasting lipid panel, hemoglobin A1C and microalbuminuria. Home sugars: BGs are running  consistent with Hgb A1C. Current treatments: Continued sulfonylurea which  has been effective, Continued metformin which has been effective, Continued statin which has been effective, Continued ACE inhibitor/ARB which has been effective and Continued Marcelline Deist which has been effective. Last dilated eye exam within the past year.    Patient's medications, allergies, past medical, surgical, social and family histories were reviewed and updated as appropriate.    Review of Systems  A comprehensive review of systems was negative except for: Eyes: positive for contacts/glasses  Gastrointestinal: positive for reflux symptoms  Musculoskeletal: positive for arthralgias, back pain, myalgias and stiff joints  Behavioral/Psych: positive for anxiety and overweight      Objective:      BP 150/90   Temp(Src) 97.8 ??F (36.6 ??C) (Oral)   Wt 172 lb 12.8 oz (78.382 kg)   BMI 29.65 kg/m2   Breastfeeding? No    General Appearance:    Alert, cooperative, no distress, appears stated age   Head:    Normocephalic, without obvious abnormality, atraumatic   Eyes:    PERRL, conjunctiva/corneas clear, EOM's intact, fundi     benign, both eyes   Ears:    Normal TM's and external ear canals, both ears   Nose:   Nares normal, septum midline, mucosa normal, no drainage    or sinus tenderness   Throat:   Lips, mucosa, and tongue normal; teeth and gums normal   Neck:   Supple, symmetrical, trachea midline, no adenopathy;     thyroid:  no enlargement/tenderness/nodules; no carotid    bruit  or JVD   Back:     Symmetric, no curvature, ROM normal, bilateral CVA tenderness   Lungs:     Clear to auscultation bilaterally, respirations unlabored   Chest Wall:    No tenderness or deformity    Heart:    Regular rate and rhythm, S1 and S2 normal, no murmur, rub   or gallop   Breast Exam:    Deferred   Abdomen:     Soft, non-tender, bowel sounds active all four quadrants,     no masses, no organomegaly   Genitalia:    Deferred   Rectal:    Deferred   Extremities:   Extremities normal, atraumatic, no cyanosis or edema, multiple joints  arthritic changes   Pulses:   2+ and symmetric all extremities   Skin:   Skin color, texture, turgor normal, no rashes or lesions   Lymph nodes:   Cervical, supraclavicular, and axillary nodes normal   Neurologic:   CNII-XII intact, normal strength, sensation and reflexes     throughout       Lab Review  Lab Results   Component Value Date    CHOL 195 07/11/2012    CHOL 206 03/28/2012    CHOL 160 05/30/2011    CHOL 207 12/09/2010    CHOL 203 03/29/2010    HDL 39 07/11/2012    HDL 35 4/54/0981    HDL 36 05/30/2011         Assessment:      Dyslipidemia under adequate control.  Hypertension, not at goal. Evidence of target organ damage: none.  Diabetes mellitus Type II, under adequate control.   1. Type II or unspecified type diabetes mellitus without mention of complication, not stated as uncontrolled  dapagliflozin (FARXIGA) 5 MG tablet    glimepiride (AMARYL) 4 MG tablet    Comprehensive Metabolic Panel    Hemoglobin A1C    Hepatic function panel   2. HTN (hypertension)  lisinopril (PRINIVIL;ZESTRIL) 10 MG tablet    CBC   3. Hyperlipidemia  simvastatin (ZOCOR) 10 MG tablet    Lipid panel   4. Anxiety  busPIRone (BUSPAR) 10 MG tablet   5. OA (osteoarthritis)     6. Lumbago     7. GERD (gastroesophageal reflux disease)     8. Headache(784.0)     9. Overweight (BMI 25.0-29.9)               Plan:      1. Continue dietary measures.  2. Continue regular exercise.  3. Lipid-lowering medications: Zocor.  4. Counseling at today's visit: discussed the need for weight loss, focused on the need for regular aerobic exercise, focused on the need to adhere to the prescribed ADA diet and discussed sick day management.  Reminded to get yearly retinal exam.  Labs: see orders.  Complete orders as above.  5. Follow up in 3 months or prn.  6. Continue other meds.  7. Spent 15 mins answering all questions.          Asjia received counseling on the following healthy behaviors: nutrition    Patient given Transport planner on nutrition    Was a  self-tracking handout given in paper form or via MyChart? No  If yes, see orders or list here.    Discussed use, benefit, and side effects of prescribed medications.  Barriers to medication compliance addressed.  All patient questions answered.  Pt voiced understanding.

## 2012-10-04 ENCOUNTER — Encounter

## 2012-10-04 LAB — HEPATIC FUNCTION PANEL
ALT: 19 U/L
AST: 23 U/L
Albumin/Globulin Ratio: 11
Albumin: 4.5
Alkaline Phosphatase: 69 U/L
Total Bilirubin: 0.5 mg/dL (ref 0.1–1.4)
Total Protein: 7.9 g/dL

## 2012-10-04 LAB — COMPREHENSIVE METABOLIC PANEL
ALT: 19 U/L
AST: 23 U/L
Albumin: 4.5
Alkaline Phosphatase: 69 U/L
BUN: 21 mg/dL
CO2: 28 mmol/L
Calcium: 9.5 mg/dL
Chloride: 103 mmol/L
Creatinine: 0.8
Gfr Calculated: 73
Glucose: 110 mg/dL
Potassium: 4.2 mmol/L
Sodium: 138 mmol/L
Total Bilirubin: 0.5 mg/dL (ref 0.1–1.4)
Total Protein: 7.9

## 2012-10-04 LAB — LIPID PANEL
Chol/HDL Ratio: 3
Cholesterol, Total: 182
HDL: 39 mg/dL (ref 35–70)
LDL Calculated: 118 mg/dL (ref 0–160)
Triglycerides: 123 mg/dL
VLDL: 25 mg/dL

## 2012-10-04 LAB — CBC
Basophils %: 0.3 %
Basophils Absolute: 0 /??L
Eosinophils %: 4.4 %
Eosinophils Absolute: 0.2 /??L
Hematocrit: 43.2 % (ref 36–46)
Hemoglobin: 14.4 g/dL (ref 12.0–16.0)
Lymphocytes %: 32.2 %
Lymphocytes Absolute: 1.8 /??L
MCH: 29.2 pg
MCHC: 33.2 g/dL
MCV: 88 fL
Monocytes %: 6.6 %
Monocytes Absolute: 0.4 /??L
Neutrophils %: 56.5 %
Neutrophils Absolute: 3.1 /??L
Platelets: 242 K/??L
RBC: 4.91 10^6/??L
WBC: 5.5 10^3/mL

## 2012-10-04 LAB — HEMOGLOBIN A1C: Hemoglobin A1C: 6.4 %

## 2012-10-21 ENCOUNTER — Telehealth

## 2012-10-21 NOTE — Telephone Encounter (Signed)
Patient called needs routine mammo order faxed to [facility]. cb [phone] apt 10/2012 @ 10: 45am Fx [phone]

## 2012-10-24 ENCOUNTER — Telehealth

## 2012-10-25 NOTE — Telephone Encounter (Signed)
Order faxed to [facility].

## 2012-10-25 NOTE — Telephone Encounter (Signed)
Discussed abnormal mammogram, will get more views

## 2012-10-25 NOTE — Telephone Encounter (Signed)
Order faxed to [facility]

## 2012-11-08 NOTE — Telephone Encounter (Signed)
Results for the following test has been finalized and entered into the patients chart

## 2012-11-22 MED ORDER — LORATADINE 10 MG PO TABS
10 MG | ORAL_TABLET | Freq: Every day | ORAL | Status: DC
Start: 2012-11-22 — End: 2013-02-13

## 2012-11-22 MED ORDER — FLUTICASONE PROPIONATE 50 MCG/ACT NA SUSP
50 MCG/ACT | Freq: Every day | NASAL | Status: DC
Start: 2012-11-22 — End: 2013-05-09

## 2012-11-22 NOTE — Telephone Encounter (Signed)
CALLED AND LEFT PATIENT A MESSAGE INFORMING PATIENT OF ASHLEY'S ORDERS, PATIENT TO CALL IF ANY QUESTIONS.

## 2012-11-22 NOTE — Telephone Encounter (Signed)
Medication sent to pharmacy. Please instruct to have patient point the tip of the flonase towards her sinuses when she sprays it, if she goes straight up it will go down her throat.

## 2012-11-22 NOTE — Telephone Encounter (Signed)
Patient c/o dizziness and ear fullness. She does not think she has a fever, but would like to know if she should take something, some headache. Patient has a history of allergies, but does not know what to do. She states she might be getting sick.

## 2012-11-29 LAB — SURGICAL PATHOLOGY

## 2013-01-21 MED ORDER — METFORMIN HCL 500 MG PO TABS
500 MG | ORAL_TABLET | ORAL | Status: DC
Start: 2013-01-21 — End: 2013-02-13

## 2013-02-13 MED ORDER — METFORMIN HCL 500 MG PO TABS
500 MG | ORAL_TABLET | Freq: Two times a day (BID) | ORAL | Status: DC
Start: 2013-02-13 — End: 2013-09-02

## 2013-02-13 MED ORDER — CEPHALEXIN 500 MG PO CAPS
500 MG | ORAL_CAPSULE | Freq: Three times a day (TID) | ORAL | Status: AC
Start: 2013-02-13 — End: 2013-02-20

## 2013-02-13 NOTE — Patient Instructions (Signed)
Potassium-Rich Diet: After Your Visit  Your Care Instructions  Potassium is a mineral. It helps keep the right mix of fluids in your body. It also helps your nerves and muscles work as they should. You'll find it in milk and meats. It's also in all fresh foods, including fruits and vegetables. Most adults need about 5 grams of potassium a day. The foods you eat should supply all that you need.  Some health conditions can cause a loss of potassium. For example, kidney problems and stomach problems with vomiting and diarrhea can cause you to lose this mineral. Some medicines, such as water pills (diuretics), can cause low potassium.  If you can't get enough potassium from what you eat, your doctor may advise you to take supplements.  Follow-up care is a key part of your treatment and safety. Be sure to make and go to all appointments, and call your doctor if you are having problems. It's also a good idea to know your test results and keep a list of the medicines you take.  How can you care for yourself at home?  ?? Plan your diet around foods that are rich in potassium. Fresh, unprocessed whole foods have the most. These foods include:  ?? Milk and other dairy products.  ?? Vegetables, especially broccoli, cooked dry beans, tomatoes, potatoes, carrots, artichokes, winter squash, and leafy green vegetables. (These greens include spinach, lettuce, and parsley.)  ?? Fruits, especially citrus fruits, bananas, and apricots. Dried apricots contain more potassium than fresh apricots.  ?? Meat, poultry, and fish.  ?? Ask your doctor about using a salt substitute or "light" salt. These often contain potassium.   Where can you learn more?   Go to [email or web address] and sign in to your MyChart account. Enter H315 in the Search Health Information box to learn more about ???Potassium-Rich Diet: After Your Visit.???    If you do not have an account, please click on the ???Sign Up Now??? link.     ?? 2006-2014 [employer],  In[employer]ated. Care instructions adapted under license by  Health. This care instruction is for use with your licensed healthcare professional. If you have questions about a medical condition or this instruction, always ask your healthcare professional. Healthwise, Incorporated disclaims any warranty or liability for your use of this information.  Content Version: 10.3.368381; Current as of: September 27, 2011

## 2013-02-13 NOTE — Progress Notes (Signed)
Patient is here for chest congestion, patient is also requesting a stronger cream.      Have you seen any other physician or provider since your last visit no    Have you had any other diagnostic tests since your last visit? no    Have you changed or stopped any medications since your last visit including any over-the-counter medicines, vitamins, or herbal medicines? no     Are you taking all your prescribed medications? Yes  If NO, why? -  N/A           Patient Self-Management Goal for this visit.   What is your goal for your visit today? - to relieve sx   Barriers to success: none   Plan for overcoming my barriers: N/A      Confidence: 10/10   Date goal set: 02/13/2013   Date expected to reach goal: 3days    Medical history Review  Past Medical, Family, and Social History reviewed and does not contribute to the patient presenting condition    Health Maintenance Due   Topic Date Due   ??? FLU VACCINE YEARLY (ADULT)  11/04/2012         Subjective:      Marissa Sawyer is here for follow up of elevated cholesterol, elevated blood pressure, and diabetes. Compliance with treatment has been good. Patient denies muscle pain associated with her medications. She is not exercising and is adherent to a low-salt diet.  Blood pressure is well controlled at home. Cardiac symptoms: none. Patient denies: chest pain, lower extremity edema and syncope. Cardiovascular risk factors: diabetes mellitus, dyslipidemia and hypertension. Use of agents associated with hypertension: NSAIDS. History of target organ damage: none. Current diabetic symptoms include: none. Patient denies foot ulcerations, nausea and vomiting. Evaluation to date has included: fasting blood sugar, fasting lipid panel, hemoglobin A1C and microalbuminuria. Home sugars: BGs are running  consistent with Hgb A1C. Current treatments: Continued sulfonylurea which has been effective, Continued metformin which has been effective, Continued statin which has been effective,  Continued ACE inhibitor/ARB which has been effective and Continued Marcelline Deist which has been effective. Last dilated eye exam within the past year. Nursing notes reviewed.    Patient's medications, allergies, past medical, surgical, social and family histories were reviewed and updated as appropriate.    Review of Systems  A comprehensive review of systems was negative except for: Eyes: positive for contacts/glasses  Ears, nose, mouth, throat, and face: positive for earaches and nasal congestion  Respiratory: positive for cough  Gastrointestinal: positive for reflux symptoms  Integument/breast: positive for pruritus, rash and skin lesion(s)  Musculoskeletal: positive for arthralgias, myalgias and stiff joints  Behavioral/Psych: positive for anxiety and overweight      Objective:      BP 122/72   Temp(Src) 97.7 ??F (36.5 ??C) (Oral)   Wt 172 lb 12.8 oz (78.382 kg)   Breastfeeding? No    General Appearance:    Alert, cooperative, no distress, appears stated age   Head:    Normocephalic, without obvious abnormality, atraumatic   Eyes:    PERRL, conjunctiva/corneas clear, EOM's intact, fundi     benign, both eyes   Ears:    Normal TM's and external ear canals, both ears   Nose:   Nares normal, septum midline, mucosa normal, no drainage    or sinus tenderness   Throat:   Lips, mucosa, and tongue normal; teeth and gums normal   Neck:   Supple, symmetrical, trachea midline, no adenopathy;  thyroid:  no enlargement/tenderness/nodules; no carotid    bruit or JVD   Back:     Symmetric, no curvature, ROM normal, bilateral CVA tenderness   Lungs:     Clear to auscultation bilaterally, respirations unlabored   Chest Wall:    No tenderness or deformity    Heart:    Regular rate and rhythm, S1 and S2 normal, no murmur, rub   or gallop   Breast Exam:    Deferred   Abdomen:     Soft, non-tender, bowel sounds active all four quadrants,     no masses, no organomegaly   Genitalia:    Deferred   Rectal:    Deferred   Extremities:    Extremities normal, atraumatic, no cyanosis or edema, both hands arthritic changes   Pulses:   2+ and symmetric all extremities   Skin:   Skin color, texture, turgor normal, + rash/lesions on RLE   Lymph nodes:   Cervical, supraclavicular, and axillary nodes normal   Neurologic:   CNII-XII intact, normal strength, sensation and reflexes     throughout       Lab Review  Lab Results   Component Value Date    CHOL 182 10/04/2012    CHOL 195 07/11/2012    CHOL 206 03/28/2012    CHOL 160 05/30/2011    CHOL 207 12/09/2010    CHOL 203 03/29/2010    HDL 39 10/04/2012    HDL 39 03/06/9145    HDL 35 11/02/5619         Assessment:      Dyslipidemia under adequate control.  Hypertension, normal blood pressure . Evidence of target organ damage: none.  Diabetes mellitus Type II, under good control.   1. Type II or unspecified type diabetes mellitus without mention of complication, not stated as uncontrolled (HCC)  metFORMIN (GLUCOPHAGE) 500 MG tablet   2. HTN (hypertension)     3. Hyperlipidemia     4. Impetiginized atopic dermatitis     5. Pruritus     6. Anxiety     7. Dental abscess  cephALEXin (KEFLEX) 500 MG capsule   8. OA (osteoarthritis)     9. Lumbago     10. GERD (gastroesophageal reflux disease)     11. Overweight (BMI 25.0-29.9)               Plan:      1. Continue dietary measures.  2. Continue regular exercise.  3. Lipid-lowering medications: Zocor.  4. Counseling at today's visit: focused on the need for regular aerobic exercise, discussed the advantages of a diet low in carbohydrates and discussed sick day management.  Reminded to get yearly retinal exam.  Continued metformin; see  medication orders.  Complete orders as above.  5. Follow up in 4 months or prn.  6. Continue other meds.  7. Spent 15 mins answering all questions.        Madicyn received counseling on the following healthy behaviors: nutrition    Patient given Transport planner on nutrition    Was a self-tracking handout given in paper form or via MyChart? No  If  yes, see orders or list here.    Discussed use, benefit, and side effects of prescribed medications.  Barriers to medication compliance addressed.  All patient questions answered.  Pt voiced understanding.

## 2013-03-03 MED ORDER — FLUCONAZOLE 100 MG PO TABS
100 MG | ORAL_TABLET | Freq: Every day | ORAL | Status: AC
Start: 2013-03-03 — End: 2013-03-08
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
## 2013-03-04 NOTE — Telephone Encounter (Signed)
Sent to pharmacy

## 2013-04-16 MED ORDER — LISINOPRIL 10 MG PO TABS
10 MG | ORAL_TABLET | ORAL | Status: DC
Start: 2013-04-16 — End: 2013-06-11

## 2013-04-17 ENCOUNTER — Encounter

## 2013-04-17 MED ORDER — DAPAGLIFLOZIN PROPANEDIOL 5 MG PO TABS
5 MG | ORAL_TABLET | Freq: Every morning | ORAL | Status: DC
Start: 2013-04-17 — End: 2013-09-22

## 2013-05-09 MED ORDER — MELOXICAM 15 MG PO TABS
15 MG | ORAL_TABLET | Freq: Every day | ORAL | Status: DC
Start: 2013-05-09 — End: 2014-03-10

## 2013-05-09 MED ORDER — OMEPRAZOLE 20 MG PO CPDR
20 MG | ORAL_CAPSULE | Freq: Every day | ORAL | Status: DC
Start: 2013-05-09 — End: 2015-05-06

## 2013-05-09 MED ORDER — FLUTICASONE PROPIONATE 50 MCG/ACT NA SUSP
50 MCG/ACT | Freq: Every day | NASAL | Status: DC
Start: 2013-05-09 — End: 2013-06-07

## 2013-05-09 NOTE — Progress Notes (Signed)
Patient is here for Lt side low back pain, bloating, and gas.      Have you seen any other physician or provider since your last visit no    Have you had any other diagnostic tests since your last visit? no    Have you changed or stopped any medications since your last visit including any over-the-counter medicines, vitamins, or herbal medicines? no     Are you taking all your prescribed medications? Yes  If NO, why? -  N/A           Patient Self-Management Goal for this visit.   What is your goal for your visit today? - to relieve sx   Barriers to success: none   Plan for overcoming my barriers: N/A      Confidence: 10/10   Date goal set: 05/09/2013   Date expected to reach goal: 68months    Medical history Review  Past Medical, Family, and Social History reviewed and does not contribute to the patient presenting condition    Health Maintenance Due   Topic Date Due   ??? FOOT EXAM  03/28/2013   ??? OPHTHALMOLOGY EXAM  03/28/2013   ??? HEMOGLOBIN A1C  04/06/2013

## 2013-05-09 NOTE — Progress Notes (Signed)
Subjective:      Patient ID: Marissa Sawyer is a 63 y.o. female.  Chief Complaint   Patient presents with   ??? Back Pain   ??? Bloated       Back Pain  This is a new problem. Episode onset: 2 weeks. The problem has been gradually improving since onset. The pain is present in the lumbar spine. The pain does not radiate. Pertinent negatives include no abdominal pain, bladder incontinence, bowel incontinence, dysuria, fever, leg pain, numbness, paresthesias, perianal numbness or tingling. Risk factors include lack of exercise, obesity, menopause, sedentary lifestyle and poor posture. Treatments tried: tylenol. The treatment provided mild relief.   Otalgia   There is pain in the right ear. This is a recurrent problem. The current episode started 1 to 4 weeks ago. The problem has been unchanged. Pertinent negatives include no abdominal pain, coughing, diarrhea, ear discharge, rhinorrhea or sore throat. She has tried nothing for the symptoms. There is no history of a chronic ear infection, hearing loss or a tympanostomy tube.     Patient states she was having a lot of gas and abdominal bloating but it has resolved. She did have some acid reflux, prevacid worked. She took it for 2 weeks per package instructions but it came back. Tried some prilosec as well which worked.     She needs updated blood work prior to her next appointment with Dr. Bobbye Charleston.     Past medical, surgical, social, and family history reviewed along with medications.     Review of Systems   Constitutional: Negative for fever, chills and fatigue.   HENT: Positive for congestion, ear pain and sinus pressure. Negative for ear discharge, postnasal drip, rhinorrhea, sneezing and sore throat.    Respiratory: Negative for cough.    Gastrointestinal: Negative for nausea, abdominal pain, diarrhea, constipation and bowel incontinence.        + GERD   Genitourinary: Negative for bladder incontinence and dysuria.   Musculoskeletal: Positive for back pain.   Neurological:  Negative for tingling, numbness and paresthesias.   All other systems reviewed and are negative.        Objective:   Physical Exam   Constitutional: She is oriented to person, place, and time. She appears well-developed and well-nourished. No distress.   HENT:   Head: Normocephalic.   Right Ear: Hearing, external ear and ear canal normal. Tympanic membrane is not erythematous, not retracted and not bulging. A middle ear effusion is present.   Left Ear: Hearing, external ear and ear canal normal. Tympanic membrane is not erythematous, not retracted and not bulging. A middle ear effusion is present.   Abdominal: Soft. Bowel sounds are normal. She exhibits no distension. There is no tenderness.   Musculoskeletal:        Lumbar back: She exhibits tenderness and pain. She exhibits normal range of motion, no bony tenderness, no swelling, no edema, no deformity, no laceration, no spasm and normal pulse.        Back:    Neurological: She is alert and oriented to person, place, and time.   Skin: Skin is warm and dry. She is not diaphoretic.   Psychiatric: She has a normal mood and affect. Her behavior is normal. Judgment and thought content normal.   Nursing note and vitals reviewed.    BP 128/80    Temp(Src) 97.5 ??F (36.4 ??C) (Oral)    Wt 175 lb 6.4 oz (79.561 kg)    Breastfeeding? No  Assessment/Plan:      1. Low back pain  Home exercises given  - XR Lumbar Spine Standard; Future  - meloxicam (MOBIC) 15 MG tablet; Take 1 tablet by mouth daily.  Dispense: 30 tablet; Refill: 3    2. GERD (gastroesophageal reflux disease)  Start med as directed  - omeprazole (PRILOSEC) 20 MG capsule; Take 1 capsule by mouth Daily.  Dispense: 30 capsule; Refill: 3    3. ETD (eustachian tube dysfunction), bilateral  Resume flonase  - fluticasone (FLONASE) 50 MCG/ACT nasal spray; 1 spray by Nasal route daily.  Dispense: 1 Bottle; Refill: 0    4. Type II or unspecified type diabetes mellitus without mention of complication, not stated as  uncontrolled  Get blood work done prior to appt, continue all medications  - Hemoglobin A1C; Future  - Microalbumin, Ur; Future    5. Hyperlipidemia  Get blood work done prior to appt, continue all medications  - Lipid Panel; Future  - Comprehensive Metabolic Panel; Future  - CBC; Future

## 2013-05-09 NOTE — Patient Instructions (Signed)
Low Back Pain: Exercises  Your Care Instructions  Here are some examples of typical rehabilitation exercises for your condition. Start each exercise slowly. Ease off the exercise if you start to have pain.  Your doctor or physical therapist will tell you when you can start these exercises and which ones will work best for you.  How to do the exercises  Press-up    1. Lie on your stomach, supporting your body with your forearms.  2. Press your elbows down into the floor to raise your upper back. As you do this, relax your stomach muscles and allow your back to arch without using your back muscles. As your press up, do not let your hips or pelvis come off the floor.  3. Hold for 15 to 30 seconds, then relax.  4. Repeat 2 to 4 times.  Alternate arm and leg (bird dog) exercise    Note: Do this exercise slowly. Try to keep your body straight at all times, and do not let one hip drop lower than the other.  1. Start on the floor, on your hands and knees.  2. Tighten your belly muscles.  3. Raise one leg off the floor, and hold it straight out behind you. Be careful not to let your hip drop down, because that will twist your trunk.  4. Hold for about 6 seconds, then lower your leg and switch to the other leg.  5. Repeat 8 to 12 times on each leg.  6. Over time, work up to holding for 10 to 30 seconds each time.  7. If you feel stable and secure with your leg raised, try raising the opposite arm straight out in front of you at the same time.  Knee-to-chest exercise    1. Lie on your back with your knees bent and your feet flat on the floor.  2. Bring one knee to your chest, keeping the other foot flat on the floor (or keeping the other leg straight, whichever feels better on your lower back).  3. Keep your lower back pressed to the floor. Hold for at least 15 to 30 seconds.  4. Relax, and lower the knee to the starting position.  5. Repeat with the other leg. Repeat 2 to 4 times with each leg.  6. To get more stretch, put  your other leg flat on the floor while pulling your knee to your chest.  Curl-ups    1. Lie on the floor on your back with your knees bent at a 90-degree angle. Your feet should be flat on the floor, about 12 inches from your buttocks.  2. Cross your arms over your chest. If this bothers your neck, try putting your hands behind your neck (not your head), with your elbows spread apart.  3. Slowly tighten your belly muscles and raise your shoulder blades off the floor.  4. Keep your head in line with your body, and do not press your chin to your chest.  5. Hold this position for 1 or 2 seconds, then slowly lower yourself back down to the floor.  6. Repeat 8 to 12 times.  Pelvic tilt exercise    1. Lie on your back with your knees bent.  2. "Brace" your stomach. This means to tighten your muscles by pulling in and imagining your belly button moving toward your spine. You should feel like your back is pressing to the floor and your hips and pelvis are rocking back.  3. Hold for about 6   seconds while you breathe smoothly.  4. Repeat 8 to 12 times.  Heel dig bridging    1. Lie on your back with both knees bent and your ankles bent so that only your heels are digging into the floor. Your knees should be bent about 90 degrees.  2. Then push your heels into the floor, squeeze your buttocks, and lift your hips off the floor until your shoulders, hips, and knees are all in a straight line.  3. Hold for about 6 seconds as you continue to breathe normally, and then slowly lower your hips back down to the floor and rest for up to 10 seconds.  4. Do 8 to 12 repetitions.  Hamstring stretch in doorway    1. Lie on your back in a doorway, with one leg through the open door.  2. Slide your leg up the wall to straighten your knee. You should feel a gentle stretch down the back of your leg.  3. Hold the stretch for at least 15 to 30 seconds. Do not arch your back, point your toes, or bend either knee. Keep one heel touching the floor  and the other heel touching the wall.  4. Repeat with your other leg.  5. Do 2 to 4 times for each leg.  Hip flexor stretch    1. Kneel on the floor with one knee bent and one leg behind you. Place your forward knee over your foot. Keep your other knee touching the floor.  2. Slowly push your hips forward until you feel a stretch in the upper thigh of your rear leg.  3. Hold the stretch for at least 15 to 30 seconds. Repeat with your other leg.  4. Do 2 to 4 times on each side.  Wall sit    1. Stand with your back 10 to 12 inches away from a wall.  2. Lean into the wall until your back is flat against it.  3. Slowly slide down until your knees are slightly bent, pressing your lower back into the wall.  4. Hold for about 6 seconds, then slide back up the wall.  5. Repeat 8 to 12 times.  Follow-up care is a key part of your treatment and safety. Be sure to make and go to all appointments, and call your doctor if you are having problems. It's also a good idea to know your test results and keep a list of the medicines you take.   Where can you learn more?   Go to [email or web address] and sign in to your MyChart account. Enter Z938 in the Search Health Information box to learn more about ???Low Back Pain: Exercises.???    If you do not have an account, please click on the ???Sign Up Now??? link.     ?? 2006-2015 Health[employer], [employer]. Care instructions adapted under license by [facility]. This care instruction is for use with your licensed healthcare professional. If you have questions about a medical condition or this instruction, always ask your healthcare professional. Healthwise, Incorporated disclaims any warranty or liability for your use of this information.  Content Version: 10.4.390249; Current as of: October 14, 2012

## 2013-06-09 MED ORDER — FLUTICASONE PROPIONATE 50 MCG/ACT NA SUSP
50 MCG/ACT | NASAL | Status: DC
Start: 2013-06-09 — End: 2014-03-10

## 2013-06-11 ENCOUNTER — Encounter

## 2013-06-11 MED ORDER — CLOTRIMAZOLE-BETAMETHASONE 1-0.05 % EX CREA
CUTANEOUS | Status: DC
Start: 2013-06-11 — End: 2015-02-04

## 2013-06-11 MED ORDER — LISINOPRIL 10 MG PO TABS
10 MG | ORAL_TABLET | ORAL | Status: DC
Start: 2013-06-11 — End: 2013-09-05
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
## 2013-08-15 LAB — COMPREHENSIVE METABOLIC PANEL
ALT: 17 U/L
AST: 18 U/L
Albumin: 4.1
Alkaline Phosphatase: 67 U/L
BUN: 24 mg/dL
CO2: 25 mmol/L
Calcium: 9.2 mg/dL
Chloride: 104 mmol/L
Creatinine: 0.7
Gfr Calculated: 85
Glucose: 114 mg/dL
Potassium: 4.4 mmol/L
Sodium: 139 mmol/L
Total Bilirubin: 0.5 mg/dL (ref 0.1–1.4)
Total Protein: 7.5

## 2013-08-15 LAB — CBC
Hematocrit: 45.5 % (ref 36–46)
Hemoglobin: 14.4 g/dL (ref 12.0–16.0)
MCH: 28 pg
MCHC: 31.7 g/dL
MCV: 88.3 fL
Platelets: 240 K/??L
WBC: 7 10^3/mL

## 2013-08-15 LAB — LIPID PANEL
Chol/HDL Ratio: 4.5
Cholesterol, Total: 176
HDL: 39 mg/dL (ref 35–70)
LDL Calculated: 112 mg/dL (ref 0–160)
Triglycerides: 125 mg/dL
VLDL: 25 mg/dL

## 2013-08-15 LAB — MICROALBUMIN, UR
Creatinine, Ur: 112.5 mg/dL
Microalb, Ur: 1.1

## 2013-08-15 LAB — HEMOGLOBIN A1C: Hemoglobin A1C: 6.7 %

## 2013-08-18 ENCOUNTER — Encounter

## 2013-08-20 MED ORDER — GLIMEPIRIDE 4 MG PO TABS
4 MG | ORAL_TABLET | ORAL | Status: DC
Start: 2013-08-20 — End: 2014-02-18

## 2013-09-02 MED ORDER — METFORMIN HCL 500 MG PO TABS
500 MG | ORAL_TABLET | ORAL | Status: DC
Start: 2013-09-02 — End: 2014-02-18

## 2013-09-08 MED ORDER — LISINOPRIL 10 MG PO TABS
10 MG | ORAL_TABLET | ORAL | Status: DC
Start: 2013-09-08 — End: 2014-03-10

## 2013-09-22 MED ORDER — SIMVASTATIN 10 MG PO TABS
10 MG | ORAL_TABLET | ORAL | Status: DC
Start: 2013-09-22 — End: 2014-03-10

## 2013-09-23 MED ORDER — FARXIGA 5 MG PO TABS
5 MG | ORAL_TABLET | ORAL | Status: DC
Start: 2013-09-23 — End: 2013-12-22

## 2013-10-20 NOTE — Progress Notes (Signed)
SUBJECTIVE: Marissa Sawyer is a 63 y.o. female who returns to the office for at risk diabetic foot care.  Patient denies wound development since last visit. Patient denies burning/tingling/numbness to feet.  Patient relates toe nails are thickened/difficult to trim. Patient admits to nails being painful with shoe gear.  Patient does not present in diabetic shoe gear today. Patient also complains today of painful bumps to the bottom of the left heel.  OBJECTIVE: Clinical evaluation of the patient reveals:     Dermatologic: toenails 1-5 of the right foot and toenails 1-5 of the left foot to be thick, elongated, discolored, brittle, with subungual debris.   There is pain with palpation and debridement of the toenails bilaterally. There are no preulcerative lesions noted to the right foot. There are preulcerative lesions noted to the left plantar heel. There is pain with direct palpation of these lesions. Debridement of these lesions with a 15 blade reveals a central core to each.   The skin to the bilateral feet is not thin and shiny.  The skin is  warm, dry, and supple to the right foot. The skin is  warm, dry, and supple to the left foot.       Vascular: DP pulse of the right foot is not palpable. DP pulse of the left foot is not palpable. PT pulse of the right foot is not palpable. PT pulse of the left foot is not palpable.   CFT is less than 3 secs to the digits of the right foot. CFT is less than 3 secs to the digits of the left foot.   There is no edema noted to the bilateral feet or ankles.   There is hair growth noted to the digits of the bilateral feet.      Neurologic: Protective sensation is present to the right plantar foot as noted with a 5.07 Semmes-Weinstein monofilament. Protective sensation is present to the left plantar foot as noted with a 5.07 Semmes-Weinstein monofilament.      Musculoskeletal: The digits of the right foot are not contracted. The digits of the left foot are not contracted.   There is  no prominence noted to the first metatarsal head of the right foot. There is no prominence noted to the first metatarsal head of the left foot. There are no other deformities noted to the right foot. There are no other deformities noted to the right foot.  A shoe examination was performed.  ASSESSMENT:   1. Dermatophytosis of nail  11721 - PR DEBRIDEMENT OF NAILS, 6 OR MORE   2. Benign neoplasm of skin  PR DESTRUCTION BENIGN LESIONS UP TO 14   3. Type II or unspecified type diabetes mellitus with peripheral circulatory disorders, not stated as uncontrolled(250.70) ([place])  11721 - PR DEBRIDEMENT OF NAILS, 6 OR MORE    PR DESTRUCTION BENIGN LESIONS UP TO 14     The patient is at risk of limb loss without Podiatric intervention.     PLAN:     1. Patient examined and evaluated.  2. Current condition and treatment options discussed in detail.  Reduced nails in length and thickness x 10 with use of nail nippers and a nail grinder without incident. Debrided preulcerative lesions of the left heel and then treated the area with Trichloracetic aced and covered them with a bandaid. Informed patient on proper diabetic foot care and importance of tight glycemic control. Patient to check feet daily and contact the office with any questions/problems/concerns.  3.  Return in about 2 weeks (around 11/03/2013) for treatment of benign skin neoplasm.      [name], MD

## 2013-11-13 NOTE — Progress Notes (Signed)
Subjective: Marissa Sawyer 63 y.o. female that presents for follow up evaluation of painful porokeratoses to the bottom of the left heel.  Chief Complaint   Patient presents with   ??? Callouses     left foot    Patient's treatment thus far has included TCA.  Pain is rated 1 out of 10 and is described as intermittent. Patient has been following my prescribed course of therapy as instructed.     Review of Systems   Constitutional: Negative for fever, chills, diaphoresis, activity change, appetite change and fatigue.   Respiratory: Negative for shortness of breath.    Cardiovascular: Negative for leg swelling.   Gastrointestinal: Negative for nausea and diarrhea.   Endocrine: Negative for cold intolerance, heat intolerance and polyuria.   Musculoskeletal: Positive for arthralgias. Negative for myalgias, back pain, joint swelling and gait problem.   Skin: Negative for color change, pallor, rash and wound.   Allergic/Immunologic: Negative for environmental allergies and food allergies.   Neurological: Negative for dizziness, weakness, light-headedness and numbness.   Hematological: Does not bruise/bleed easily.   Psychiatric/Behavioral: Negative for behavioral problems, confusion and self-injury. The patient is not nervous/anxious.          Objective: Clinical evaluation of the patient reveals callous formation x 2 to the plantar aspect of the left heel. There is pain with side to side compression of these lesions. Debridement of these lesions with a 15 blade reveals a central core to each. The core of each lesion was also removed with a 15 blade. There were no signs of infection noted to either lesion. Both lesions have decreased in size and depth since last visit. There were no new lesions noted to the left foot.     Assessment:   1. Benign neoplasm of skin of left lower extremity  PR DESTRUCTION BENIGN LESIONS UP TO 14   2. 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 Clinical evaluation of the patient. 2. Following debridement of the lesions of the left heel they were each treated with TCA and covered with a band aid.  3. Return in about 2 weeks (around 11/27/2013) for evaluation of painful porokeratoses left foot.     [name], MD

## 2013-12-09 ENCOUNTER — Ambulatory Visit
Admit: 2013-12-09 | Discharge: 2013-12-09 | Payer: BLUE CROSS/BLUE SHIELD | Attending: Foot & Ankle Surgery | Primary: Adult Health

## 2013-12-09 ENCOUNTER — Ambulatory Visit
Admit: 2013-12-09 | Discharge: 2013-12-09 | Payer: BLUE CROSS/BLUE SHIELD | Attending: Gastroenterology | Primary: Adult Health

## 2013-12-09 DIAGNOSIS — K219 Gastro-esophageal reflux disease without esophagitis: Secondary | ICD-10-CM

## 2013-12-09 DIAGNOSIS — D2372 Other benign neoplasm of skin of left lower limb, including hip: Secondary | ICD-10-CM
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
## 2013-12-09 NOTE — Progress Notes (Signed)
The patient presents with painful lesions on the left feet. The patient has 2 lesions that are deep seated and have a painful core.    Marissa Sawyer is a 63 y.o. female with the chief complaint of painful lesions on their feet. They have been present for several months.    Review of Systems   Constitutional: Negative for fever, chills, diaphoresis, activity change, appetite change and fatigue.   Respiratory: Negative for shortness of breath.    Cardiovascular: Negative for leg swelling.   Gastrointestinal: Negative for nausea and diarrhea.   Endocrine: Negative for cold intolerance, heat intolerance and polyuria.   Musculoskeletal: Negative for myalgias, back pain, joint swelling, arthralgias and gait problem.   Skin: Positive for wound. Negative for color change, pallor and rash.   Allergic/Immunologic: Negative for environmental allergies and food allergies.   Neurological: Negative for dizziness, weakness, light-headedness and numbness.   Hematological: Does not bruise/bleed easily.   Psychiatric/Behavioral: Negative for behavioral problems, confusion and self-injury. The patient is not nervous/anxious.      Lower extremity physical exam:    Vascular: Pedal pulses are palpable. No edema or varicosities are noted.    Neurology: Sensation is normal. Reflexes are within normal limits.    Orthopedic: Joint range of motion is normal. Muscle strength is normal. There are nostructural deformities noted to the bilateral feet.    Dermatology: Inspection of skin and all tissues are normal except where stated below. Nails arenormal. Benign lesions are present on the plantar left foot. There is pain with direct palpation of these lesions. There is a central core noted upon debridement of each of the lesions. There is no pinpoint bleeding noted upon debridement.    Assessment:   1. Benign neoplasm of skin of left lower extremity  PR DESTRUCTION BENIGN LESIONS UP TO 14   2. Type 2 diabetes mellitus without complication ([facility])   PR DESTRUCTION BENIGN LESIONS UP TO 14   3. Pain in left foot  PR DESTRUCTION BENIGN LESIONS UP TO 14       Plan:  The lesions were debrided with a 15 blade and then Trichloracetic acid was applied under occlusion. The patient will leave in place for 24-48 hours and then remove. The patient tolerated the procedure well and without complication. Return if symptoms worsen or fail to improve.      [name], DPM

## 2013-12-22 MED ORDER — FARXIGA 5 MG PO TABS
5 MG | ORAL_TABLET | ORAL | Status: DC
Start: 2013-12-22 — End: 2014-03-07

## 2013-12-31 LAB — TSH: TSH: 0.924 u[IU]/mL (ref 0.40–4.40)

## 2014-01-01 LAB — TISSUE TRANSGLUTAMINASE ANTIBODY IGA W/ REFLEX: Tissue Transglutaminase IgA: 0.8 U/ML

## 2014-01-02 ENCOUNTER — Encounter

## 2014-01-07 ENCOUNTER — Inpatient Hospital Stay: Admit: 2014-01-07 | Attending: Gastroenterology | Primary: Adult Health

## 2014-01-07 LAB — POC GLUCOSE FINGERSTICK: POC Glucose: 106 mg/dL — ABNORMAL HIGH (ref 65–105)

## 2014-01-07 MED ORDER — SODIUM CHLORIDE 0.9 % IV SOLN
0.9 % | INTRAVENOUS | Status: DC
Start: 2014-01-07 — End: 2014-01-08
  Administered 2014-01-07: 13:00:00 via INTRAVENOUS

## 2014-01-07 NOTE — Progress Notes (Signed)
bloodsugar 109 today
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
## 2014-01-07 NOTE — Op Note (Signed)
ESOPHAGOGASTRODUODENOSCOPY   ( EGD )  DATE OF PROCEDURE: 01/07/2014     SURGEON: Jane Canary, MD    ASSISTANT: None    PREOPERATIVE DIAGNOSIS: dyspepsia, h/o adenoma of stomach    POSTOPERATIVE DIAGNOSIS: ? Flat polyp    OPERATION: Upper GI endoscopy with Biopsy    ANESTHESIA: Moderate Sedation     ESTIMATED BLOOD LOSS: None    COMPLICATIONS: None.     SPECIMENS:  Was Obtained: gastric    HISTORY: The patient is a 63 y.o. year old female with history of above preop diagnosis.  I recommended esophagogastroduodenoscopy with possible biopsy and I explained the risk, benefits, expected outcome, and alternatives to the procedure.  Risks included but are not limited to bleeding, infection, respiratory distress, hypotension, and perforation of the esophagus, stomach, or duodenum.  Patient understands and is in agreement.    PROCEDURE: The patient was given IV conscious sedation.  The patient's SPO2 remained above 90% throughout the procedure. Cetacaine spray given.  Patient placed in left lateral position.  Olympus GIF 160 videogastroscope was inserted orally under vision into the esophagus without difficulty and advanced into the stomach then through the pylorus up to the second part of duodenum.      Findings:    Retropharyngeal area was grossly normal appearing    Esophagus: normal    Stomach:    Fundus and Cardia Examined in Retroflexed View: normal    Body: abnormal: ? Small 2 to 3 mm flat polyp in lower body=biopsies taken    Antrum: abnormal: 3 to 4 mm ,flat polyp like lesion =biopsies taken and excised    Duodenum:     Descending: normal    Bulb: normal    While withdrawing the scope the above findings were verified and the scope was removed.  The patient tolerated the procedure well.     Recommendations/Plan:   1. F/U Biopsies  2. F/U In Office as instructed  3. Discussed with the family    Electronically signed by Jane Canary, MD  on 01/07/2014 at 9:41 AM

## 2014-01-08 MED FILL — MIDAZOLAM HCL 2 MG/2ML IJ SOLN: 2 MG/ML | INTRAMUSCULAR | Qty: 4

## 2014-01-08 MED FILL — DERMOPLAST 20-0.5 % EX AERO: CUTANEOUS | Qty: 56

## 2014-01-08 MED FILL — FENTANYL CITRATE 0.05 MG/ML IJ SOLN: 0.05 MG/ML | INTRAMUSCULAR | Qty: 2

## 2014-01-09 LAB — SURGICAL PATHOLOGY

## 2014-01-27 NOTE — Telephone Encounter (Signed)
Called patient left message to schedule egd 6 wk follow-up with Dr. Ailene Rud  Thanks

## 2014-02-09 MED ORDER — METFORMIN HCL 500 MG PO TABS
500 MG | ORAL_TABLET | ORAL | Status: DC
Start: 2014-02-09 — End: 2014-02-18

## 2014-02-18 ENCOUNTER — Ambulatory Visit
Admit: 2014-02-18 | Discharge: 2014-02-18 | Payer: BLUE CROSS/BLUE SHIELD | Attending: Gastroenterology | Primary: Adult Health

## 2014-02-18 DIAGNOSIS — K5730 Diverticulosis of large intestine without perforation or abscess without bleeding: Secondary | ICD-10-CM

## 2014-02-18 NOTE — Progress Notes (Signed)
Subjective:      Patient ID: Marissa Sawyer is a 63 y.o. female.    HPI   Dr. Vida Roller, MD our mutual patient Marissa Sawyer was seen  for   1. Diverticulosis of large intestine without hemorrhage    2. Gastroesophageal reflux disease, esophagitis presence not specified     .blood sugar  Marissa Sawyer Hence, 63 year old patient seen in the office with the symptoms of GERD, dyspepsia.  Recently she had a EGD done which revealed small flat polyp-like lesions in the stomach.  The histology from this polyp biopsy is benign.  This were explained to the patient.  Patient is on PPI therapy with satisfactory relief of her symptom.  Denies dysphagia.  She has a good appetite.  However she has some weight loss.  Also has a history of abdominal bloating.    She has diabetes and her blood sugars are around 130.  Past Medical, Family, and Social History reviewed and does contribute to the patient presenting condition.    patient"s PMH/PSH,SH,PSYCH hx, MEDs, ALLERGIES, and ROS was all reviewed and updated ion the appropriate sections        Review of Systems   Constitutional: Negative.    HENT: Negative.    Eyes: Positive for visual disturbance (she statets that she needs glasses).   Respiratory: Negative.    Cardiovascular: Negative.    Gastrointestinal: Negative for nausea, vomiting, abdominal pain, constipation (occasional), blood in stool, abdominal distention, anal bleeding and rectal pain.   Endocrine: Negative.    Genitourinary: Negative.    Musculoskeletal: Positive for back pain and arthralgias.   Skin: Negative.    Allergic/Immunologic: Positive for environmental allergies. Negative for food allergies.   Psychiatric/Behavioral: Positive for sleep disturbance. The patient is nervous/anxious.        Objective:   Physical Exam   Constitutional: She is oriented to person, place, and time. She appears well-developed and well-nourished.   HENT:   Head: Normocephalic and atraumatic.   No oral lesions   Eyes: Conjunctivae are normal.  Pupils are equal, round, and reactive to light. No scleral icterus.   Neck: Normal range of motion. Neck supple. No hepatojugular reflux and no JVD present. No tracheal deviation present. No thyromegaly present.   Cardiovascular: Normal rate, regular rhythm, normal heart sounds and intact distal pulses.    Pulmonary/Chest: Effort normal and breath sounds normal. No respiratory distress. She has no wheezes. She has no rales.   Abdominal: Soft. Bowel sounds are normal. She exhibits no distension, no ascites and no mass. There is no hepatomegaly. There is no tenderness. There is no rebound. No hernia.   Musculoskeletal: She exhibits no edema or tenderness.   No joint swelling   Lymphadenopathy:     She has no cervical adenopathy.   Neurological: She is alert and oriented to person, place, and time. No cranial nerve deficit.   Skin: Skin is warm. No bruising, no ecchymosis and no rash noted. No erythema.   Psychiatric: Thought content normal.   Nursing note and vitals reviewed.      Assessment:       1. Diverticulosis of large intestine without hemorrhage    2. Gastroesophageal reflux disease, esophagitis presence not specified            Plan:      It is not clear regarding etiology of her weight loss.  It is possible this may be secondary to poor glycemic control.  However other organic pathology needs  to be considered.  I did explain the patient regarding this and it is reasonable to wait for another 4 weeks and see whether she has further weight loss.  Patient is advised to have weight checkup done in the next 4 weeks.  If she continued to lose weight to contact me so that further investigations can be planned.  Patient understood agreed.

## 2014-03-09 MED ORDER — FARXIGA 5 MG PO TABS
5 MG | ORAL_TABLET | ORAL | Status: DC
Start: 2014-03-09 — End: 2014-03-10

## 2014-03-10 ENCOUNTER — Ambulatory Visit: Admit: 2014-03-10 | Discharge: 2014-03-10 | Payer: BLUE CROSS/BLUE SHIELD | Attending: Medical | Primary: Adult Health

## 2014-03-10 DIAGNOSIS — E119 Type 2 diabetes mellitus without complications: Secondary | ICD-10-CM

## 2014-03-10 LAB — POCT GLYCOSYLATED HEMOGLOBIN (HGB A1C): Hemoglobin A1C: 6.2 %

## 2014-03-10 MED ORDER — BUSPIRONE HCL 10 MG PO TABS
10 MG | ORAL_TABLET | Freq: Three times a day (TID) | ORAL | Status: DC | PRN
Start: 2014-03-10 — End: 2015-09-01

## 2014-03-10 MED ORDER — DAPAGLIFLOZIN PROPANEDIOL 5 MG PO TABS
5 MG | ORAL_TABLET | ORAL | Status: DC
Start: 2014-03-10 — End: 2014-09-29

## 2014-03-10 MED ORDER — LISINOPRIL 10 MG PO TABS
10 MG | ORAL_TABLET | ORAL | Status: DC
Start: 2014-03-10 — End: 2014-03-10

## 2014-03-10 MED ORDER — LISINOPRIL 10 MG PO TABS
10 MG | ORAL_TABLET | ORAL | Status: DC
Start: 2014-03-10 — End: 2014-06-10

## 2014-03-10 MED ORDER — METFORMIN HCL 500 MG PO TABS
500 MG | ORAL_TABLET | Freq: Two times a day (BID) | ORAL | Status: DC
Start: 2014-03-10 — End: 2014-09-08

## 2014-03-10 NOTE — Progress Notes (Signed)
Patient here today for Diabetic Check and A1C.  Patient also needs refills.  Patient also needs order for Mammogram    Have you seen any other physician or provider since your last visit no    Have you had any other diagnostic tests since your last visit? no    Have you changed or stopped any medications since your last visit including any over-the-counter medicines, vitamins, or herbal medicines? no     Are you taking all your prescribed medications? Yes  If NO, why? -  N/A           Patient Self-Management Goal for this visit.   What is your goal for your visit today? - refills   Barriers to success: none   Plan for overcoming my barriers: N/A      Confidence: 9/10   Date goal set: 03/10/14   Date expected to reach goal: 1day    Medical history Review  Past Medical, Family, and Social History reviewed and does not contribute to the patient presenting condition    Health Maintenance Due   Topic Date Due   ??? PNEUMOVAX 1 DOSE 19-64Y (1) [birth year]/30[birth year]1967   ??? FOOT EXAM  03/28/2013   ??? OPHTHALMOLOGY EXAM  03/28/2013

## 2014-03-10 NOTE — Progress Notes (Signed)
[facility]  [address] [place]  [place] [address]  Dept: [phone]  Dept Fax: [phone]    Visit Date: 03/10/2014    Marissa Sawyer is a 64 y.o. female who presents today for:   Chief Complaint   Patient presents with   ??? Diabetes   ??? Medication Refill       HPI:     HPI   Pt here for routine check up   Pt has not been taking Simvastatin or Glimperide    Diabetes Mellitus Type 2: Current symptoms/problems include none and neuropathy. Has pt scheduled with Podiatry on 03/20/14 pt denies any other associated symptoms    Home blood sugar records: fasting range: 121-130s  Any episodes of hypoglycemia? Yes when she was taking the glimperide  Eye exam current (within one year): no  Tobacco history: She  reports that she has never smoked. She has never used smokeless tobacco.   Daily Aspirin? No    Hypertension:  Home blood pressure monitoring: No.  She is not adherent to a low sodium diet. Patient denies chest pain, shortness of breath, headache, lightheadedness, blurred vision, peripheral edema and palpitations.  Antihypertensive medication side effects: no medication side effects noted.  Use of agents associated with hypertension: none. Denies any other associated symtpms    Hyperlipidemia:  No new myalgias or GI upset on stop taking Simvastatin due to she forgot to take it so she did not start back up.   Denies associated symptoms        Current Outpatient Prescriptions   Medication Sig Dispense Refill   ??? busPIRone (BUSPAR) 10 MG tablet Take 1 tablet by mouth 3 times daily as needed (anxiety) 60 tablet 1   ??? dapagliflozin (FARXIGA) 5 MG tablet TAKE  ONE TABLET BY MOUTH EVERY MORNING 30 tablet 3   ??? lisinopril (PRINIVIL;ZESTRIL) 10 MG tablet TAKE ONE TABLET BY MOUTH DAILY 90 tablet 0   ??? metFORMIN (GLUCOPHAGE) 500 MG tablet Take 1 tablet by mouth 2 times daily (with meals) for 30 doses 60 tablet 5   ??? clotrimazole-betamethasone (LOTRISONE) 1-0.05 % cream  Apply topically 2 times daily. 45 g 1   ??? omeprazole (PRILOSEC) 20 MG capsule Take 1 capsule by mouth Daily. 30 capsule 3   ??? glucose monitoring kit (FREESTYLE) monitoring kit 1 each by Does not apply route once for 1 dose. 1 kit 0   ??? Glucose Blood (BLOOD GLUCOSE TEST STRIPS) STRP Test blood sugar once daily. 50 strip 3   ??? Lancets MISC  100 each 3     No current facility-administered medications for this visit.     No Known Allergies    Subjective:      Review of Systems   Constitutional: Negative for fever, chills, activity change, appetite change, fatigue and unexpected weight change.   Respiratory: Negative for cough, chest tightness and shortness of breath.    Cardiovascular: Negative for chest pain, palpitations and leg swelling.   Endocrine: Positive for polydipsia and polyuria.   Genitourinary: Negative for hematuria, flank pain and difficulty urinating.   Musculoskeletal: Positive for arthralgias.   Neurological: Negative for dizziness, light-headedness and headaches.   Psychiatric/Behavioral: Negative for agitation.   All other systems reviewed and are negative.      Objective:     BP 102/62 mmHg   Pulse 74   Temp(Src) 98.8 ??F (37.1 ??C) (Tympanic)   Ht '5\' 4"'  (1.626 m)  Wt 153 lb (69.4 kg)   BMI 26.25 kg/m2    Physical Exam   Constitutional: She is oriented to person, place, and time. She appears well-developed and well-nourished.   HENT:   Head: Normocephalic.   Right Ear: External ear normal.   Left Ear: External ear normal.   Nose: Nose normal.   Mouth/Throat: Oropharynx is clear and moist.   Eyes: Conjunctivae are normal. Pupils are equal, round, and reactive to light.   Neck: Normal range of motion. Neck supple.   Cardiovascular: Normal rate, regular rhythm and normal heart sounds.    Pulmonary/Chest: Effort normal and breath sounds normal.   Musculoskeletal: Normal range of motion.   Neurological: She is alert and oriented to person, place, and time. She has normal reflexes.   Skin: Skin is warm and  dry.   Psychiatric: She has a normal mood and affect.   Nursing note and vitals reviewed.  Foot exam: no non healing or open wounds noted on PE; nails trimmed at appropriated length   Labs: POCT A1C 6.1    Assessment:      1. Type 2 diabetes mellitus without complication (HCC)  dapagliflozin (FARXIGA) 5 MG tablet    lisinopril (PRINIVIL;ZESTRIL) 10 MG tablet    metFORMIN (GLUCOPHAGE) 500 MG tablet    POCT glycosylated hemoglobin (Hb A1C)    CBC    Lipid Panel    Comprehensive Metabolic Panel    [facility] DI[facility]ET[facility] FOOT EXAM   2. Breast cancer screening  [facility] Digital Screen Bilateral   3. Anxiety  busPIRone (BUSPAR) 10 MG tablet   4. Hyperlipidemia, unspecified hyperlipidemia  CBC    Lipid Panel    Comprehensive Metabolic Panel   5. Secondary hypertension, unspecified  CBC    Comprehensive Metabolic Panel       Plan:    Foot exam done in office today  Discussed for 5 mins the importance of communicating with health care provider before quitting medications  Will con't medications unchanged at this time; will refill medications  Will send for routine fasting labs  Order given for Mammogram  Advised to schedule eye exam  Advised to really watch diet especially sodium, carbs and fats; educational material provided during today's visit  Advised to con't to monitor and log BS 2x times daily once fasting and once after meals bring into next visit    Orders Placed:  Orders Placed This Encounter   Procedures   ??? [facility] Digital Screen Bilateral     Standing Status: Future      Number of Occurrences:       Standing Expiration Date: 05/09/2015     Order Specific Question:  Reason for exam:     Answer:  routine   ??? CBC     Standing Status: Future      Number of Occurrences:       Standing Expiration Date: 03/10/2015   ??? Lipid Panel     Standing Status: Future      Number of Occurrences:       Standing Expiration Date: 03/10/2015     Order Specific Question:  Is Patient Fasting?/# of Hours     Answer:  Fast 8-10 hours   ??? Comprehensive Metabolic  Panel     Standing Status: Future      Number of Occurrences:       Standing Expiration Date: 03/10/2015   ??? POCT glycosylated hemoglobin (Hb A1C)   ??? [facility] DIABETES FOOT EXAM  Medications Prescribed:  Orders Placed This Encounter   Medications   ??? busPIRone (BUSPAR) 10 MG tablet     Sig: Take 1 tablet by mouth 3 times daily as needed (anxiety)     Dispense:  60 tablet     Refill:  1   ??? dapagliflozin (FARXIGA) 5 MG tablet     Sig: TAKE  ONE TABLET BY MOUTH EVERY MORNING     Dispense:  30 tablet     Refill:  3   ??? lisinopril (PRINIVIL;ZESTRIL) 10 MG tablet     Sig: TAKE ONE TABLET BY MOUTH DAILY     Dispense:  90 tablet     Refill:  0   ??? metFORMIN (GLUCOPHAGE) 500 MG tablet     Sig: Take 1 tablet by mouth 2 times daily (with meals) for 30 doses     Dispense:  60 tablet     Refill:  5       Return in about 3 months (around 06/09/2014), or or sooner, for routine check up.       Electronically signed by Einar Crow, PA-C on 03/10/2014 at 10:18 PM

## 2014-03-10 NOTE — Patient Instructions (Signed)
Diabetes Foot Health: Care Instructions  Your Care Instructions     When you have diabetes, your feet need extra care and attention. Diabetes can damage the nerve endings and blood vessels in your feet, making you less likely to notice when your feet are injured. Diabetes also limits your body's ability to fight infection and get blood to areas that need it. If you get a minor foot injury, it could become an ulcer or a serious infection. With good foot care, you can prevent most of these problems.  Caring for your feet can be quick and easy. Most of the care can be done when you are bathing or getting ready for bed.  Follow-up care is a key part of your treatment and safety. Be sure to make and go to all appointments, and call your doctor if you are having problems. It???s also a good idea to know your test results and keep a list of the medicines you take.  How can you care for yourself at home?  ?? Keep your blood sugar close to normal by watching what and how much you eat, monitoring blood sugar, taking medicines if prescribed, and getting regular exercise.  ?? Do not smoke. Smoking affects blood flow and can make foot problems worse. If you need help quitting, talk to your doctor about stop-smoking programs and medicines. These can increase your chances of quitting for good.  ?? Eat a diet that is low in fats. High fat intake can cause fat to build up in your blood vessels and decrease blood flow.  ?? Inspect your feet daily for blisters, cuts, cracks, or sores. If you cannot see well, use a mirror or have someone help you.  ?? Take care of your feet:  ?? Wash your feet every day. Use warm (not hot) water. Check the water temperature with your wrists or other part of your body, not your feet.  ?? Dry your feet well. Pat them dry. Do not rub the skin on your feet too hard. Dry well between your toes. If the skin on your feet stays moist, bacteria or a fungus can grow, which can lead to infection.  ?? Keep your skin soft.  Use moisturizing skin cream to keep the skin on your feet soft and prevent calluses and cracks. But do not put the cream between your toes, and stop using any cream that causes a rash.  ?? Clean underneath your toenails carefully. Do not use a sharp object to clean underneath your toenails. Use the blunt end of a nail file or other rounded tool.  ?? Trim and file your toenails straight across to prevent ingrown toenails. Use a nail clipper, not scissors. Use an emery board to smooth the edges.  ?? Change socks daily. Socks without seams are best, because seams often rub the feet. You can find socks for people with diabetes from specialty catalogs.  ?? Look inside your shoes every day for things like gravel or torn linings, which could cause blisters or sores.  ?? Buy shoes that fit well:  ?? Look for shoes that have plenty of space around the toes. This helps prevent bunions and blisters.  ?? Try on shoes while wearing the kind of socks you will usually wear with the shoes.  ?? Avoid plastic shoes. They may rub your feet and cause blisters. Good shoes should be made of materials that are flexible and breathable, such as leather or cloth.  ?? Break in new shoes slowly by   wearing them for no more than an hour a day for several days. Take extra time to check your feet for red areas, blisters, or other problems after you wear new shoes.  ?? Do not go barefoot. Do not wear sandals, and do not wear shoes with very thin soles. Thin soles are easy to puncture. They also do not protect your feet from hot pavement or cold weather.  ?? Have your doctor check your feet during each visit. If you have a foot problem, see your doctor. Do not try to treat an early foot problem at home. Home remedies or treatments that you can buy without a prescription (such as corn removers) can be harmful.  ?? Always get early treatment for foot problems. A minor irritation can lead to a major problem if not properly cared for early.  When should you call  for help?  Call your doctor now or seek immediate medical care if:  ?? You have a foot sore, an ulcer or break in the skin that is not healing after 4 days, bleeding corns or calluses, or an ingrown toenail.  ?? You have blue or black areas, which can mean bruising or blood flow problems.  ?? You have peeling skin or tiny blisters between your toes or cracking or oozing of the skin.  ?? You have a fever for more than 24 hours and a foot sore.  ?? You have new numbness or tingling in your feet that does not go away after you move your feet or change positions.  ?? You have unexplained or unusual swelling of the foot or ankle.  Watch closely for changes in your health, and be sure to contact your doctor if:  ?? You cannot do proper foot care.   Where can you learn more?   Go to [email or web address] and sign in to your MyChart account. Enter A739 in the [place] box to learn more about ???Diabetes Foot Health: Care Instructions.???    If you do not have an account, please click on the ???Sign Up Now??? link.     ?? 2006-2015 Healthwise, Incorporated. Care instructions adapted under license by [facility]. This care instruction is for use with your licensed healthcare professional. If you have questions about a medical condition or this instruction, always ask your healthcare professional. [place] any warranty or liability for your use of this information.  Content Version: 10.6.465758; Current as of: Jul 25, 2013              Learning About Diabetes Food Guidelines  Your Care Instructions  Meal planning is important to manage diabetes. It helps keep your blood sugar at a target level (which you set with your doctor). You don't have to eat special foods. You can eat what your family eats, including sweets once in a while. But you do have to pay attention to how often you eat and how much you eat of certain foods.  You may want to work with a dietitian or a certified  diabetes educator (CDE) to help you plan meals and snacks. A dietitian or CDE can also help you lose weight if that is one of your goals.  What should you know about eating carbs?  Managing the amount of carbohydrate (carbs) you eat is an important part of healthy meals when you have diabetes. Carbohydrate is found in many foods.  ?? Learn which foods have carbs. And learn the amounts of carbs in different foods.  ??  Bread, cereal, pasta, and rice have about 15 grams of carbs in a serving. A serving is 1 slice of bread (1 ounce), ?? cup of cooked cereal, or 1/3 cup of cooked pasta or rice.  ?? Fruits have 15 grams of carbs in a serving. A serving is 1 small fresh fruit, such as an apple or orange; ?? of a banana; ?? cup of cooked or canned fruit; ?? cup of fruit juice; 1 cup of melon or raspberries; or 2 tablespoons of dried fruit.  ?? Milk and no-sugar-added yogurt have 15 grams of carbs in a serving. A serving is 1 cup of milk or 2/3 cup of no-sugar-added yogurt.  ?? Starchy vegetables have 15 grams of carbs in a serving. A serving is ?? cup of mashed potatoes or sweet potato; 1 cup winter squash; ?? of a small baked potato; ?? cup of cooked beans; or ?? cup cooked corn or green peas.  ?? Learn how much carbs to eat each day and at each meal. A dietitian or CDE can teach you how to keep track of the amount of carbs you eat. This is called carbohydrate counting.  ?? If you are not sure how to count carbohydrate grams, use the Plate Method to plan meals. It is a good, quick way to make sure that you have a balanced meal. It also helps you spread carbs throughout the day.  ?? Divide your plate by types of foods. Put non-starchy vegetables on half the plate, meat or other protein food on one-quarter of the plate, and a grain or starchy vegetable in the final quarter of the plate. To this you can add a small piece of fruit and 1 cup of milk or yogurt, depending on how many carbs you are supposed to eat at a meal.  ?? Try to eat about  the same amount of carbs at each meal. Do not "save up" your daily allowance of carbs to eat at one meal.  ?? Proteins have very little or no carbs per serving. Examples of proteins are beef, chicken, [place], fish, eggs, tofu, cheese, cottage cheese, and peanut butter. A serving size of meat is 3 ounces, which is about the size of a deck of cards. Examples of meat substitute serving sizes (equal to 1 ounce of meat) are 1/4 cup of cottage cheese, 1 egg, 1 tablespoon of peanut butter, and ?? cup of tofu.  How can you eat out and still eat healthy?  ?? Learn to estimate the serving sizes of foods that have carbohydrate. If you measure food at home, it will be easier to estimate the amount in a serving of restaurant food.  ?? If the meal you order has too much carbohydrate (such as potatoes, corn, or baked beans), ask to have a low-carbohydrate food instead. Ask for a salad or green vegetables.  ?? If you use insulin, check your blood sugar before and after eating out to help you plan how much to eat in the future.  ?? If you eat more carbohydrate at a meal than you had planned, take a walk or do other exercise. This will help lower your blood sugar.  What else should you know?  ?? Limit saturated fat, such as the fat from meat and dairy products. This is a healthy choice because people who have diabetes are at higher risk of heart disease. So choose lean cuts of meat and nonfat or low-fat dairy products. Use olive or canola oil instead of butter  or shortening when cooking.  ?? Don't skip meals. Your blood sugar may drop too low if you skip meals and take insulin or certain medicines for diabetes.  ?? Check with your doctor before you drink alcohol. Alcohol can cause your blood sugar to drop too low. Alcohol can also cause a bad reaction if you take certain diabetes medicines.  Follow-up care is a key part of your treatment and safety. Be sure to make and go to all appointments, and call your doctor if you are having problems.  It's also a good idea to know your test results and keep a list of the medicines you take.   Where can you learn more?   Go to [email or web address] and sign in to your MyChart account. Enter [id number] in the [place] box to learn more about ???Learning About Diabetes Food Guidelines.???    If you do not have an account, please click on the ???Sign Up Now??? link.     ?? 2006-2015 Healthwise, Incorporated. Care instructions adapted under license by [facility]. This care instruction is for use with your licensed healthcare professional. If you have questions about a medical condition or this instruction, always ask your healthcare professional. [place] any warranty or liability for your use of this information.  Content Version: 10.6.465758; Current as of: Jul 25, 2013              Breast Self-Exam: Care Instructions  Your Care Instructions  A breast self-exam is when you check your breasts for lumps or changes. This regular exam helps you learn how your breasts normally look and feel. Most breast problems or changes are not because of cancer.  Breast self-exam is not a substitute for a mammogram. Having regular breast exams by your doctor and regular mammograms improve your chances of finding any problems with your breasts.  Some women set a time each month to do a step-by-step breast self-exam. Other women like a less formal system. They might look at their breasts as they brush their teeth, or feel their breasts once in a while in the shower.  If you notice a change in your breast, tell your doctor.  Follow-up care is a key part of your treatment and safety. Be sure to make and go to all appointments, and call your doctor if you are having problems. It???s also a good idea to know your test results and keep a list of the medicines you take.  How do you do a breast self-exam?  ?? The best time to examine your breasts is usually one week after your menstrual period  begins. Your breasts should not be tender then. If you do not have periods, you might do your exam on a day of the month that is easy to remember.  ?? To examine your breasts:  ?? Remove all your clothes above the waist and lie down. When you are lying down, your breast tissue spreads evenly over your chest wall, which makes it easier to feel all your breast tissue.  ?? Use the pads--not the fingertips--of the 3 middle fingers of your left hand to check your right breast. Move your fingers slowly in small coin-sized circles that overlap.  ?? Use three levels of pressure to feel of all your breast tissue. Use light pressure to feel the tissue close to the skin surface. Use medium pressure to feel a little deeper. Use firm pressure to feel your tissue close to your breastbone and ribs. Use each  pressure level to feel your breast tissue before moving on to the next spot.  ?? Check your entire breast, moving up and down as if following a strip from the collarbone to the bra line, and from the armpit to the ribs. Repeat until you have covered the entire breast.  ?? Repeat this procedure for your left breast, using the pads of the 3 middle fingers of your right hand.  ?? To examine your breasts while in the shower:  ?? Place one arm over your head and lightly soap your breast on that side.  ?? Using the pads of your fingers, gently move your hand over your breast (in the strip pattern described above), feeling carefully for any lumps or changes.  ?? Repeat for the other breast.  ?? Have your doctor inspect anything you notice to see if you need further testing.   Where can you learn more?   Go to [email or web address] and sign in to your MyChart account. Enter P148 in the [place] box to learn more about ???Breast Self-Exam: Care Instructions.???    If you do not have an account, please click on the ???Sign Up Now??? link.     ?? 2006-2015 Healthwise, Incorporated. Care instructions adapted under license by  [facility]. This care instruction is for use with your licensed healthcare professional. If you have questions about a medical condition or this instruction, always ask your healthcare professional. [place] any warranty or liability for your use of this information.  Content Version: 10.6.465758; Current as of: April 25, 2013              DASH Diet: Care Instructions  Your Care Instructions  The DASH diet is an eating plan that can help lower your blood pressure. DASH stands for Dietary Approaches to Stop Hypertension. Hypertension is high blood pressure.  The DASH diet focuses on eating foods that are high in calcium, potassium, and magnesium. These nutrients can lower blood pressure. The foods that are highest in these nutrients are fruits, vegetables, low-fat dairy products, nuts, seeds, and legumes. But taking calcium, potassium, and magnesium supplements instead of eating foods that are high in those nutrients does not have the same effect. The DASH diet also includes whole grains, fish, and poultry.  The DASH diet is one of several lifestyle changes your doctor may recommend to lower your high blood pressure. Your doctor may also want you to decrease the amount of sodium in your diet. Lowering sodium while following the DASH diet can lower blood pressure even further than just the DASH diet alone.  Follow-up care is a key part of your treatment and safety. Be sure to make and go to all appointments, and call your doctor if you are having problems. It's also a good idea to know your test results and keep a list of the medicines you take.  How can you care for yourself at home?  Following the DASH diet  ?? Eat 4 to 5 servings of fruit each day. A serving is 1 medium-sized piece of fruit, ?? cup chopped or canned fruit, 1/4 cup dried fruit, or 4 ounces (?? cup) of fruit juice. Choose fruit more often than fruit juice.  ?? Eat 4 to 5 servings of vegetables each day. A serving is 1 cup  of lettuce or raw leafy vegetables, ?? cup of chopped or cooked vegetables, or 4 ounces (?? cup) of vegetable juice. Choose vegetables more often than vegetable juice.  ?? Get  2 to 3 servings of low-fat and fat-free dairy each day. A serving is 8 ounces of milk, 1 cup of yogurt, or 1 ?? ounces of cheese.  ?? Eat 6 to 8 servings of grains each day. A serving is 1 slice of bread, 1 ounce of dry cereal, or ?? cup of cooked rice, pasta, or cooked cereal. Try to choose whole-grain products as much as possible.  ?? Limit lean meat, poultry, and fish to 2 servings each day. A serving is 3 ounces, about the size of a deck of cards.  ?? Eat 4 to 5 servings of nuts, seeds, and legumes (cooked dried beans, lentils, and split peas) each week. A serving is 1/3 cup of nuts, 2 tablespoons of seeds, or ?? cup of cooked beans or peas.  ?? Limit fats and oils to 2 to 3 servings each day. A serving is 1 teaspoon of vegetable oil or 2 tablespoons of salad dressing.  ?? Limit sweets and added sugars to 5 servings or less a week. A serving is 1 tablespoon jelly or jam, ?? cup sorbet, or 1 cup of lemonade.  ?? Eat less than 2,300 milligrams (mg) of sodium a day. If you have high blood pressure, diabetes, or chronic kidney disease, if you are African-American, or if you are older than age 24, try to limit the amount of sodium you eat to less than 1,500 mg a day.  Tips for success  ?? Start small. Do not try to make dramatic changes to your diet all at once. You might feel that you are missing out on your favorite foods and then be more likely to not follow the plan. Make small changes, and stick with them. Once those changes become habit, add a few more changes.  ?? Try some of the following:  ?? Make it a goal to eat a fruit or vegetable at every meal and at snacks. This will make it easy to get the recommended amount of fruits and vegetables each day.  ?? Try yogurt topped with fruit and nuts for a snack or healthy dessert.  ?? Add lettuce, tomato,  cucumber, and onion to sandwiches.  ?? Combine a ready-made pizza crust with low-fat mozzarella cheese and lots of vegetable toppings. Try using tomatoes, squash, spinach, broccoli, carrots, cauliflower, and onions.  ?? Have a variety of cut-up vegetables with a low-fat dip as an appetizer instead of chips and dip.  ?? Sprinkle sunflower seeds or chopped almonds over salads. Or try adding chopped walnuts or almonds to cooked vegetables.  ?? Try some vegetarian meals using beans and peas. Add garbanzo or kidney beans to salads. Make burritos and tacos with mashed pinto beans or black beans.   Where can you learn more?   Go to [email or web address] and sign in to your MyChart account. Enter [phone] in the [place] box to learn more about ???DASH Diet: Care Instructions.???    If you do not have an account, please click on the ???Sign Up Now??? link.     ?? 2006-2015 Healthwise, Incorporated. Care instructions adapted under license by [facility]. This care instruction is for use with your licensed healthcare professional. If you have questions about a medical condition or this instruction, always ask your healthcare professional. [place] any warranty or liability for your use of this information.  Content Version: 10.6.465758; Current as of: April 25, 2013

## 2014-03-13 ENCOUNTER — Encounter

## 2014-03-13 LAB — COMPREHENSIVE METABOLIC PANEL
ALT: 16 U/L
AST: 19 U/L
Albumin: 4.3
Alkaline Phosphatase: 78 U/L
BUN: 21 mg/dL
CO2: 29 mmol/L
Calcium: 9.5 mg/dL
Chloride: 105 mmol/L
Creatinine: 0.7
Glucose: 124 mg/dL
Potassium: 4.4 mmol/L
Sodium: 140 mmol/L
Total Bilirubin: 0.5 mg/dL (ref 0.1–1.4)
Total Protein: 7.5

## 2014-03-13 LAB — CBC
Basophils %: 0.3 %
Basophils Absolute: 0 /??L
Eosinophils %: 2.6 %
Eosinophils Absolute: 0.2 /??L
Hematocrit: 44 % (ref 36–46)
Hemoglobin: 14 g/dL (ref 12.0–16.0)
Lymphocytes %: 27.4 %
Lymphocytes Absolute: 1.8 /??L
MCH: 28.8 pg
MCHC: 31.9 g/dL
MCV: 90.4 fL
Monocytes %: 5.5 %
Monocytes Absolute: 0.4 /??L
Neutrophils %: 64.2 %
Neutrophils Absolute: 4.3 /??L
Platelets: 236 K/??L
RBC: 4.87 10^6/??L
WBC: 6.7 10^3/mL

## 2014-03-13 LAB — LIPID PANEL
Chol/HDL Ratio: 5.5
Cholesterol, Total: 221
HDL: 40 mg/dL (ref 35–70)
LDL Calculated: 158 mg/dL (ref 0–160)
Triglycerides: 114 mg/dL
VLDL: 23 mg/dL

## 2014-03-20 ENCOUNTER — Ambulatory Visit
Admit: 2014-03-20 | Discharge: 2014-03-20 | Payer: BLUE CROSS/BLUE SHIELD | Attending: Foot & Ankle Surgery | Primary: Adult Health

## 2014-03-20 DIAGNOSIS — L84 Corns and callosities: Secondary | ICD-10-CM

## 2014-03-20 NOTE — Progress Notes (Signed)
Subjective: Marissa Sawyer 64 y.o. female that presents for follow up evaluation of painful lumps on both feet.  Chief Complaint   Patient presents with   ??? Foot Pain     lt foot lump on the heel, also check rt foot     Patient's treatment thus far has included serial debridement and treatment with TCA.  Pain is rated 3 out of 10 and is described as intermittent. Patient has been following my prescribed course of therapy as instructed.     Review of Systems   Constitutional: Negative for fever, chills, diaphoresis, activity change, appetite change and fatigue.   Respiratory: Negative for shortness of breath.    Cardiovascular: Negative for leg swelling.   Gastrointestinal: Negative for nausea and diarrhea.   Endocrine: Negative for cold intolerance, heat intolerance and polyuria.   Musculoskeletal: Positive for arthralgias. Negative for myalgias, back pain, joint swelling and gait problem.   Skin: Negative for color change, pallor, rash and wound.   Allergic/Immunologic: Negative for environmental allergies and food allergies.   Neurological: Negative for dizziness, weakness, light-headedness and numbness.   Hematological: Does not bruise/bleed easily.   Psychiatric/Behavioral: Negative for behavioral problems, confusion and self-injury. The patient is not nervous/anxious.        Objective: Clinical evaluation of the patient reveals pre-ulcerative lesions to the plantar aspect of the bilateral heels and to the plantar aspect of the right hallux. There is pain with direct palpation of the lesions of the bilateral feet. There is no erythema, calor, or drainage noted to any of the lesions. Debridement of the lesions reveals a central core to each. There is no pinpoint bleeding noted to any of the lesions.    Assessment:   1. Pre-ulcerative calluses  PR DESTRUCTION BENIGN LESIONS UP TO 14   2. Pain in both feet  PR DESTRUCTION BENIGN LESIONS UP TO 14         Plan: 1. Clinical evaluation of the patient. 2. Following  debridement of the lesions of the bilateral feet, they were treated with TCA.  3. Return in about 2 weeks (around 04/03/2014) for eval of porokeratoses bilaterally.   03/20/2014      [name], DPM

## 2014-04-03 ENCOUNTER — Inpatient Hospital Stay: Attending: Medical | Primary: Adult Health

## 2014-04-16 ENCOUNTER — Encounter: Attending: Foot & Ankle Surgery | Primary: Adult Health

## 2014-06-03 ENCOUNTER — Ambulatory Visit
Admit: 2014-06-03 | Discharge: 2014-06-03 | Payer: BLUE CROSS/BLUE SHIELD | Attending: Advanced Practice Midwife | Primary: Adult Health

## 2014-06-03 DIAGNOSIS — Z01419 Encounter for gynecological examination (general) (routine) without abnormal findings: Secondary | ICD-10-CM
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
## 2014-06-03 NOTE — Addendum Note (Signed)
Addended by: Phineas Semen on: 06/03/2014 03:05 PM     Modules accepted: Level of Service

## 2014-06-04 LAB — HUMAN PAPILLOMAVIRUS (HPV) DNA PROBE THIN PREP HIGH RISK
HPV, Genotype 16: NOT DETECTED
HPV, Genotype 18: NOT DETECTED
HPV, High Risk Other: NOT DETECTED

## 2014-06-09 ENCOUNTER — Encounter: Attending: Medical | Primary: Adult Health

## 2014-06-10 MED ORDER — LISINOPRIL 10 MG PO TABS
10 MG | ORAL_TABLET | ORAL | Status: DC
Start: 2014-06-10 — End: 2014-10-30

## 2014-06-11 ENCOUNTER — Ambulatory Visit: Admit: 2014-06-11 | Discharge: 2014-06-11 | Payer: BLUE CROSS/BLUE SHIELD | Attending: Medical | Primary: Adult Health

## 2014-06-11 DIAGNOSIS — E119 Type 2 diabetes mellitus without complications: Secondary | ICD-10-CM

## 2014-06-11 NOTE — Progress Notes (Signed)
Patient here today to discuss labs    Have you changed or stopped any medications since your last visit including any over-the-counter medicines, vitamins, or herbal medicines? no     Are you taking all your prescribed medications? Yes          If NO, why? -  N/A    Have you seen any other physician or provider since your last visit no    Have you had any other diagnostic tests since your last visit? yes - labs    Tobacco use:  Patient  reports that she has never smoked. She has never used smokeless tobacco.   If a smoker, cessation materials provided? NA   1-800-QUIT-NOW [phone])     Medical history Review  Past Medical, Family, and Social History reviewed and does not contribute to the patient presenting condition    There are no preventive care reminders to display for this patient.  Subjective:       Marissa Sawyer is an 64 y.o. female who presents for follow up of diabetes. Current symptoms include:mild paresthesia of the feetShe states that motrin does help with symptoms. Patient denies foot ulcerations, hyperglycemia, hypoglycemia , polydipsia and polyuria. Evaluation to date has included: fasting blood sugar, fasting lipid panel and hemoglobin A1C. Home sugars: BGs range between 90 and 120 and in the morning. Current treatments: Metformin and Farxiga. Last dilated eye exam not up to date.      Patient is here for follow-up of elevated blood pressure. She is not exercising and is adherent to a low-salt diet. She does not check Bp at home Cardiac symptoms: none. Patient denies chest pain, chest pressure/discomfort, exertional chest pressure/discomfort, irregular heart beat and palpitations. Cardiovascular risk factors: diabetes mellitus and hypertension. Use of agents associated with hypertension: none. History of target organ damage: none.    Patient has hx of chronic GERD. Patient states symptoms are stable with medication Prilosec. Denies any current associated symptoms or side effects to  medications.    Patient's medications, allergies, past medical, surgical, social and family histories were reviewed and updated as appropriate.     Review of Systems   Constitutional: Negative for fever, chills, diaphoresis and fatigue.   Eyes: Negative for photophobia and visual disturbance.   Respiratory: Negative for cough and chest tightness.    Cardiovascular: Negative for chest pain, palpitations and leg swelling.   Gastrointestinal: Negative for nausea, vomiting, abdominal pain, diarrhea, constipation and blood in stool.   Endocrine: Negative for polyphagia and polyuria.   Genitourinary: Negative for dysuria, hematuria, flank pain and difficulty urinating.   Skin: Negative.    Neurological: Positive for numbness. Negative for dizziness, light-headedness and headaches.   All other systems reviewed and are negative.    Objective:      BP 102/62 mmHg   Pulse 64   Temp(Src) 97.4 ??F (36.3 ??C) (Oral)   Ht 5' 4.25" (1.632 m)   Wt 163 lb (73.936 kg)   BMI 27.76 kg/m2   SpO2 98%    General Appearance:    Alert, cooperative, no distress, appears stated age   Head:    Normocephalic, without obvious abnormality, atraumatic   Eyes:    PERRL, conjunctiva/corneas clear, EOM's intact, fundi     benign, both eyes   Ears:    Normal TM's and external ear canals, both ears   Nose:   Nares normal, septum midline, mucosa normal, no drainage    or sinus tenderness   Throat:  Lips, mucosa, and tongue normal; teeth and gums normal   Neck:   Supple, symmetrical, trachea midline, no adenopathy;     thyroid:  no enlargement/tenderness/nodules; no carotid    bruit or JVD   Back:     Symmetric, no curvature, ROM normal, no CVA tenderness   Lungs:     Clear to auscultation bilaterally, respirations unlabored   Chest Wall:    No tenderness or deformity    Heart:    Regular rate and rhythm, S1 and S2 normal, no murmur, rub   or gallop   Breast Exam:    deferred   Abdomen:     Soft, non-tender, bowel sounds active all four quadrants,     no  masses, no organomegaly   Genitalia:  deferred   Rectal:   deferred   Extremities:   Extremities normal, atraumatic, no cyanosis or edema   Pulses:   2+ and symmetric all extremities   Skin:   Skin color, texture, turgor normal, no rashes or lesions   Lymph nodes:   Cervical, supraclavicular, and axillary nodes normal   Neurologic:   CNII-XII intact, normal strength, sensation and reflexes     throughout       Laboratory:     CBC:   Lab Results   Component Value Date    WBC 6.7 03/12/2014    WBC 6.8 03/29/2010    RBC 4.87 03/12/2014    RBC 4.54 03/29/2010    HGB 14.0 03/12/2014    HCT 44.0 03/12/2014    MCV 90.4 03/12/2014    MCH 28.8 03/12/2014    MCHC 31.9 03/12/2014    RDW 13.8 05/30/2011    PLT 236 03/12/2014    PLT 248 03/29/2010    MPV 8.2 05/30/2011       CMP:   Lab Results   Component Value Date    NA 140 03/12/2014    K 4.4 03/12/2014    CL 105 03/12/2014    CO2 29 03/12/2014    BUN 21 03/12/2014    CREATININE 0.7 03/12/2014    CREATININE 0.75 03/29/2010    GLUCOSE 124 03/12/2014    GLUCOSE 121 03/29/2010    CALCIUM 9.5 03/12/2014    PROT 7.9 10/04/2012    PROT 7.7 05/30/2011    LABALBU 4.3 03/12/2014    LABALBU 4.3 03/29/2010    BILITOT 0.5 03/12/2014    ALKPHOS 78 03/12/2014    AST 19 03/12/2014    ALT 16 03/12/2014        A1C:   Lab Results   Component Value Date    LABA1C 6.2 03/10/2014       Lipid Panel:   Lab Results   Component Value Date    CHOL 221 03/12/2014    CHOL 160 05/30/2011    HDL 40 03/12/2014    TRIG 114 03/12/2014     Assessment:     1. Type 2 diabetes mellitus without complication ([place])     2. Secondary hypertension, unspecified     3. Hyperlipidemia, unspecified hyperlipidemia     4. Gastroesophageal reflux disease without esophagitis          Plan:   Reviewed labs and discussed labs and elevation of cholesterol and LDL; will con't with life style modification for now due to patient states she really do not want to start a med at this time especially due to not being able to tolerate a  statin in the past. I advised that she  really needs to adhere to a more healthier life style and advised that if cholesterol continues to elevated that we will discussed medication alternatives. Also discussed a goal of getting A1C down under 6   Discussed general issues about diabetes pathophysiology and management.  Counseling at today's visit: discussed the advantages of a diet low in carbohydrates, reminded to check sugars regularly and to bring readings in at the time of the next visit, discussed DASH diet and discussed management of hypoglycemic episodes.  Suggested low cholesterol diet.  Encouraged aerobic exercise.  Discussed foot care.  Reminded to get yearly retinal exam.    Con't medications unchanged  RTC 3 mths or prn

## 2014-06-15 LAB — GYN CYTOLOGY

## 2014-07-30 ENCOUNTER — Ambulatory Visit: Admit: 2014-07-30 | Discharge: 2014-07-30 | Payer: BLUE CROSS/BLUE SHIELD | Attending: Medical | Primary: Adult Health

## 2014-07-30 DIAGNOSIS — M25511 Pain in right shoulder: Secondary | ICD-10-CM

## 2014-07-30 MED ORDER — IBUPROFEN 800 MG PO TABS
800 MG | ORAL_TABLET | Freq: Three times a day (TID) | ORAL | Status: DC | PRN
Start: 2014-07-30 — End: 2015-02-04

## 2014-07-30 MED ORDER — TIZANIDINE HCL 4 MG PO TABS
4 MG | ORAL_TABLET | Freq: Three times a day (TID) | ORAL | Status: DC | PRN
Start: 2014-07-30 — End: 2014-12-03

## 2014-07-30 NOTE — Progress Notes (Signed)
Patient here today for congestion and back pain.  She is also concerned with possible memory loss    Have you changed or stopped any medications since your last visit including any over-the-counter medicines, vitamins, or herbal medicines? no     Are you taking all your prescribed medications? Yes          If NO, why? -  N/A    Have you seen any other physician or provider since your last visit no    Have you had any other diagnostic tests since your last visit? no    Tobacco use:  Patient  reports that she has never smoked. She has never used smokeless tobacco.   If a smoker, cessation materials provided? NA   1-800-QUIT-NOW [phone])     Medical history Review  Past Medical, Family, and Social History reviewed and does not contribute to the patient presenting condition    There are no preventive care reminders to display for this patient.  Subjective:      Marissa Sawyer is a 64 y.o. female who presents with right shoulder pain. The symptoms began several months ago. Symptoms were initiated by no known event. Pain is located diffusely throughout the shoulder. Discomfort is described as aching. Symptoms are exacerbated by repetitive movements, overhead movements and lying on the shoulder. Evaluation to date: none. Therapy to date includes nothing specific.    Patient also complains chest congestions along with post nasal drip.   She states the other day, it was like she was in a fog and she could not remember what she was doing at work. She states the her ears feel plugged.   Symptoms has been for a couple weeks.    Patient's medications, allergies, past medical, surgical, social and family histories were reviewed and updated as appropriate.   Review of Systems   Constitutional: Negative for chills, diaphoresis and fatigue.   HENT: Positive for congestion.    Respiratory: Negative for cough, chest tightness, shortness of breath and stridor.    Cardiovascular: Negative for chest pain, palpitations and leg  swelling.   Genitourinary: Negative for dysuria and difficulty urinating.   Musculoskeletal: Positive for myalgias and back pain.   Skin: Negative.    Neurological: Negative for dizziness, facial asymmetry and light-headedness.   All other systems reviewed and are negative.    Objective:      BP 110/60 mmHg   Pulse 77   Temp(Src) 97.8 ??F (36.6 ??C) (Tympanic)   Ht 5' 4.17" (1.63 m)   Wt 162 lb (73.483 kg)   BMI 27.66 kg/m2   SpO2 98%  Right shoulder: Non-specific diffuse tenderness about the shoulder   Left shoulder: Normal active ROM, no tenderness, no impingement sign     Physical Exam   Constitutional: She is oriented to person, place, and time. She appears well-developed and well-nourished.   HENT:   Head: Normocephalic.   Right Ear: External ear normal.   Left Ear: External ear normal.   Nose: Nose normal.   Mouth/Throat: Oropharynx is clear and moist.   Eyes: Conjunctivae are normal. Pupils are equal, round, and reactive to light.   Neck: Normal range of motion. Neck supple.   Cardiovascular: Normal rate, regular rhythm, normal heart sounds and intact distal pulses.    Pulmonary/Chest: Effort normal and breath sounds normal.   Abdominal: Soft. Bowel sounds are normal.   Neurological: She is alert and oriented to person, place, and time.   Skin: Skin is warm and  dry.   Psychiatric: She has a normal mood and affect.   Nursing note and vitals reviewed.    Assessment:     1. Shoulder pain, right     2. Chest congestion         Plan:      Natural history and expected course discussed. Questions answered.  Neurosurgeon distributed.  Gentle ROM exercises  RICE therapy.  NSAIDs per medication orders.    Muscle relaxant  Given sample of  Mucinex congestion in office today to try to help with symptoms  Advised she can take an OTC allergy medication daily  Keep regular scheduled app't or RTC prn  Spent 11 min (>50% of visit) in discussion and answering all questions

## 2014-07-30 NOTE — Patient Instructions (Addendum)
Effective August 17, 2014 our new name and location are as follows:    [facility]  [address]  [place], [place] [address]  [phone] (phone)  [phone] (fax)    Shoulder Arthritis: Exercises  Your Care Instructions  Here are some examples of typical rehabilitation exercises for your condition. Start each exercise slowly. Ease off the exercise if you start to have pain.  Your doctor or physical therapist will tell you when you can start these exercises and which ones will work best for you.  How to do the exercises  Shoulder flexion (lying down)    Note: To make a wand for this exercise, use a piece of PVC pipe or a broom handle with the broom removed. Make the wand about a foot wider than your shoulders.  1. Lie on your back, holding a wand with both hands. Your palms should face down as you hold the wand.  2. Keeping your elbows straight, slowly raise your arms over your head. Raise them until you feel a stretch in your shoulders, upper back, and chest.  3. Hold for 15 to 30 seconds.  4. Repeat 2 to 4 times.  Shoulder rotation (lying down)    Note: To make a wand for this exercise, use a piece of PVC pipe or a broom handle with the broom removed. Make the wand about a foot wider than your shoulders.  1. Lie on your back. Hold a wand with both hands with your elbows bent and palms up.  2. Keep your elbows close to your body, and move the wand across your body toward the sore arm.  3. Hold for 8 to 12 seconds.  4. Repeat 2 to 4 times.  Shoulder internal rotation with towel    1. Hold a towel above and behind your head with the arm that is not sore.  2. With your sore arm, reach behind your back and grasp the towel.  3. With the arm above your head, pull the towel upward. Do this until you feel a stretch on the front and outside of your sore shoulder.  4. Hold 15 to 30 seconds.  5. Repeat 2 to 4 times.  Shoulder blade squeeze    1. Stand with your arms at your sides, and  squeeze your shoulder blades together. Do not raise your shoulders up as you squeeze.  2. Hold 6 seconds.  3. Repeat 8 to 12 times.  Resisted rows    Note: For this exercise, you will need elastic exercise material, such as surgical tubing or Thera-Band.  1. Put the band around a solid object at about waist level. (A bedpost will work well.) Each hand should hold an end of the band.  2. With your elbows at your sides and bent to 90 degrees, pull the band back. Your shoulder blades should move toward each other. Return to the starting position.  3. Repeat 8 to 12 times.  External rotator strengthening exercise    1. Start by tying a piece of elastic exercise material to a doorknob. You can use surgical tubing or Thera-Band. (You may also hold one end of the band in each hand.)  2. Stand or sit with your shoulder relaxed and your elbow bent 90 degrees. Your upper arm should rest comfortably against your side. Squeeze a rolled towel between your elbow and your body for comfort. This will help keep your arm at your side.  3. Hold one end  of the elastic band with the hand of the painful arm.  4. Start with your forearm across your belly. Slowly rotate the forearm out away from your body. Keep your elbow and upper arm tucked against the towel roll or the side of your body until you begin to feel tightness in your shoulder. Slowly move your arm back to where you started.  5. Repeat 8 to 12 times.  Internal rotator strengthening exercise    1. Start by tying a piece of elastic exercise material to a doorknob. You can use surgical tubing or Thera-Band.  2. Stand or sit with your shoulder relaxed and your elbow bent 90 degrees. Your upper arm should rest comfortably against your side. Squeeze a rolled towel between your elbow and your body for comfort. This will help keep your arm at your side.  3. Hold one end of the elastic band in the hand of the painful arm.  4. Slowly rotate your forearm toward your body until it touches  your belly. Slowly move it back to where you started.  5. Keep your elbow and upper arm firmly tucked against the towel roll or at your side.  6. Repeat 8 to 12 times.  Pendulum swing    Note: If you have pain in your back, do not do this exercise.  1. Hold on to a table or the back of a chair with your good arm. Then bend forward a little and let your sore arm hang straight down. This exercise does not use the arm muscles. Rather, use your legs and your hips to create movement that makes your arm swing freely.  2. Use the movement from your hips and legs to guide the slightly swinging arm back and forth like a pendulum (or elephant trunk). Then guide it in circles that start small (about the size of a dinner plate). Make the circles a bit larger each day, as your pain allows.  3. Do this exercise for 5 minutes, 5 to 7 times each day.  4. As you have less pain, try bending over a little farther to do this exercise. This will increase the amount of movement at your shoulder.  Follow-up care is a key part of your treatment and safety. Be sure to make and go to all appointments, and call your doctor if you are having problems. It's also a good idea to know your test results and keep a list of the medicines you take.   Where can you learn more?   Go to [email or web address] and sign in to your MyChart account. Enter H562 in the [place] box to learn more about ???Shoulder Arthritis: Exercises.???    If you do not have an account, please click on the ???Sign Up Now??? link.     ?? 2006-2015 Healthwise, Incorporated. Care instructions adapted under license by [facility]. This care instruction is for use with your licensed healthcare professional. If you have questions about a medical condition or this instruction, always ask your healthcare professional. [place] any warranty or liability for your use of this information.  Content Version: 10.6.465758; Current as of:  Jul 25, 2013              Shoulder Stretches: Exercises  Your Care Instructions  Here are some examples of exercises for your shoulder. Start each exercise slowly. Ease off the exercise if you start to have pain.  Your doctor or physical therapist will tell you when you can start these  exercises and which ones will work best for you.  How to do the exercises  Note: These exercises should cause you to feel a gentle stretch, but no pain.  Shoulder stretch    5. Stand in a doorway and place one arm against the door frame. Your elbow should be a little higher than your shoulder.  6. Relax your shoulders as you lean forward, allowing your chest and shoulder muscles to stretch. You can also turn your body slightly away from your arm to stretch the muscles even more.  7. Hold for 15 to 30 seconds.  8. Repeat 2 to 4 times with each arm.  Shoulder and chest stretch    Shoulder and chest stretch   5. While sitting, relax your upper body so you slump slightly in your chair.  6. As you breathe in, straighten your back and open your arms out to the sides.  7. Gently pull your shoulder blades back and downward.  8. Hold for 15 to 30 seconds as your breathe normally.  9. Repeat 2 to 4 times.  Overhead stretch    6. Reach up over your head with both arms.  7. Hold for 15 to 30 seconds.  8. Repeat 2 to 4 times.  Follow-up care is a key part of your treatment and safety. Be sure to make and go to all appointments, and call your doctor if you are having problems. It's also a good idea to know your test results and keep a list of the medicines you take.   Where can you learn more?   Go to [email or web address] and sign in to your MyChart account. Enter S254 in the [place] box to learn more about ???Shoulder Stretches: Exercises.???    If you do not have an account, please click on the ???Sign Up Now??? link.     ?? 2006-2015 Healthwise, Incorporated. Care instructions adapted under license by [facility].  This care instruction is for use with your licensed healthcare professional. If you have questions about a medical condition or this instruction, always ask your healthcare professional. [place] any warranty or liability for your use of this information.  Content Version: 10.6.465758; Current as of: Jul 25, 2013              Frozen Shoulder: Exercises  Your Care Instructions  Here are some examples of typical rehabilitation exercises for your condition. Start each exercise slowly. Ease off the exercise if you start to have pain.  Your doctor or physical therapist will tell you when you can start these exercises and which ones will work best for you.  How to do the exercises  Neck stretches    9. Look straight ahead, and tip your right ear to your right shoulder. Do not let your left shoulder rise up as you tip your head to the right.  10. Hold 15 to 30 seconds.  11. Tilt your head to the left. Do not let your right shoulder rise up as you tip your head to the left.  12. Hold for 15 to 30 seconds.  13. Repeat 2 to 4 times to each side.  Shoulder rolls    10. Sit comfortably with your feet shoulder-width apart. You can also do this exercise while standing.  11. Roll your shoulders up, then back, and then down in a smooth, circular motion.  12. Repeat 2 to 4 times.  Shoulder flexion (lying down)    Note: For this exercise, you  will need a wand. To make a wand, use a piece of PVC pipe or a broom handle with the broom removed. Make the wand about a foot wider than your shoulders.  9. Lie on your back, holding a wand with your hands. Your palms should face down as you hold the wand. Place your hands slightly wider than your shoulders.  10. Keeping your elbows straight, slowly raise your arms over your head until you feel a stretch in your shoulders, upper back, and chest.  11. Hold 15 to 30 seconds.  12. Repeat 2 to 4 times.  Shoulder rotation (lying down)    Note: To make a wand, use a piece of  PVC pipe or a broom handle with the broom removed. Make the wand about a foot wider than your shoulders.  4. Lie on your back and hold a wand in both hands with your elbows bent and your palms up.  5. Keeping your elbows close to your body, move the wand across your body toward the arm that has pain.  6. Hold for 15 to 30 seconds.  7. Repeat 2 to 4 times.  Shoulder internal rotation with towel    4. Roll up a towel lengthwise. Hold the towel above and behind your head with the arm that is not sore.  5. With your sore arm, reach behind your back and grasp the towel.  6. Using the arm above your head, pull the towel upward until you feel a stretch on the front and outside of your sore shoulder.  7. Hold 15 to 30 seconds.  8. Relax and move the towel back down to the starting position.  9. Repeat 2 to 4 times.  Shoulder blade squeeze    6. While standing with your arms at your sides, squeeze your shoulder blades together. Do not raise your shoulders up as you are squeezing.  7. Hold for 6 seconds.  8. Repeat 8 to 12 times.  Follow-up care is a key part of your treatment and safety. Be sure to make and go to all appointments, and call your doctor if you are having problems. It's also a good idea to know your test results and keep a list of the medicines you take.   Where can you learn more?   Go to [email or web address] and sign in to your MyChart account. Enter R144 in the [place] box to learn more about ???Frozen Shoulder: Exercises.???    If you do not have an account, please click on the ???Sign Up Now??? link.     ?? 2006-2015 Healthwise, Incorporated. Care instructions adapted under license by [facility]. This care instruction is for use with your licensed healthcare professional. If you have questions about a medical condition or this instruction, always ask your healthcare professional. [place] any warranty or liability for your use of this  information.  Content Version: 10.6.465758; Current as of: Jul 25, 2013

## 2014-09-08 ENCOUNTER — Encounter

## 2014-09-08 MED ORDER — METFORMIN HCL 500 MG PO TABS
500 MG | ORAL_TABLET | Freq: Two times a day (BID) | ORAL | 5 refills | Status: DC
Start: 2014-09-08 — End: 2015-05-06

## 2014-09-10 ENCOUNTER — Encounter: Attending: Medical | Primary: Adult Health

## 2014-09-22 ENCOUNTER — Ambulatory Visit: Admit: 2014-09-22 | Discharge: 2014-09-22 | Payer: BLUE CROSS/BLUE SHIELD | Attending: Medical | Primary: Adult Health

## 2014-09-22 DIAGNOSIS — E119 Type 2 diabetes mellitus without complications: Secondary | ICD-10-CM

## 2014-09-22 LAB — POCT GLYCOSYLATED HEMOGLOBIN (HGB A1C): Hemoglobin A1C: 6.5 %

## 2014-09-22 NOTE — Progress Notes (Signed)
Patient here today for a 3 month follow up for diabetes    Have you changed or stopped any medications since your last visit including any over-the-counter medicines, vitamins, or herbal medicines? no     Are you taking all your prescribed medications? Yes          If NO, why? -  N/A    Have you seen any other physician or provider since your last visit no    Have you had any other diagnostic tests since your last visit? no    Tobacco use:  Patient  reports that she has never smoked. She has never used smokeless tobacco.   If a smoker, cessation materials provided? NA   1-800-QUIT-NOW ([phone])     Medical history Review  Past Medical, Family, and Social History reviewed and does not contribute to the patient presenting condition    Health Maintenance Due   Topic Date Due   ??? MICROALBUMINURIA  08/16/2014   ??? HEMOGLOBIN A1C  09/08/2014   ??? FLU VACCINE YEARLY (ADULT)  10/05/2014     Subjective:      Marissa Sawyer is 64 y/o female here for follow up of elevated cholesterol, elevated blood pressure, and diabetes. Compliance with treatment has been good.She is exercising and is adherent to a low-salt diet.  Cardiac symptoms: none. Patient denies: chest pain, chest pressure/discomfort, exertional chest pressure/discomfort, irregular heart beat, lower extremity edema and palpitations. Cardiovascular risk factors: diabetes mellitus and dyslipidemia. Use of agents associated with hypertension: none. History of target organ damage: none. Current diabetic symptoms include: none. Patient denies nausea, polyuria, visual disturbances and vomiting. Evaluation to date has included: hemoglobin A1C. Home sugars: bloods sugar avg 133. Current treatments: see medication list. Last dilated eye exam upcoming exam next week.    GERD  Paitent complains of heartburn. This has been associated with no other symptoms.  She denies abdominal bloating, difficulty swallowing, fullness after meals, heartburn, nausea, need to clear throat  frequently, regurgitation of undigested food and upper abdominal discomfort. Symptoms have been present for several years. She denies dysphagia. She has not lost weight. She denies melena, hematochezia, hematemesis, and coffee ground emesis. Medical therapy in the past has included Omeprazole.      Patient's medications, allergies, past medical, surgical, social and family histories were reviewed and updated as appropriate.    Review of Systems   Constitutional: Negative for chills, diaphoresis, fatigue and fever.   Eyes: Negative for photophobia.   Respiratory: Negative for cough, chest tightness and shortness of breath.    Cardiovascular: Negative for chest pain, palpitations and leg swelling.   Gastrointestinal: Negative for abdominal pain, diarrhea, nausea and vomiting.   Endocrine: Negative for heat intolerance, polydipsia, polyphagia and polyuria.   Genitourinary: Negative for difficulty urinating, dysuria and flank pain.   Musculoskeletal: Positive for arthralgias.   Skin: Negative.    Neurological: Negative for dizziness, tremors, light-headedness, numbness and headaches.   Hematological: Negative for adenopathy. Does not bruise/bleed easily.   All other systems reviewed and are negative.     Objective:        Visit Vitals   ??? BP 130/74 (Site: Right Arm, Position: Sitting, Cuff Size: Large Adult)   ??? Pulse 68   ??? Temp 97.8 ??F (36.6 ??C) (Oral)   ??? Ht 5\' 4"  (1.626 m)   ??? Wt 163 lb (73.9 kg)   ??? SpO2 98%   ??? BMI 27.98 kg/m2       General Appearance:  Alert, cooperative, no distress, appears stated age   Head:    Normocephalic, without obvious abnormality, atraumatic   Eyes:    PERRL, conjunctiva/corneas clear, EOM's intact, fundi     benign, both eyes   Ears:    Normal TM's and external ear canals, both ears   Nose:   Nares normal, septum midline, mucosa normal, no drainage    or sinus tenderness   Throat:   Lips, mucosa, and tongue normal; teeth and gums normal   Neck:   Supple, symmetrical, trachea midline,  no adenopathy;     thyroid:  no enlargement/tenderness/nodules; no carotid    bruit or JVD   Back:     Symmetric, no curvature, ROM normal, no CVA tenderness   Lungs:     Clear to auscultation bilaterally, respirations unlabored   Chest Wall:    No tenderness or deformity    Heart:    Regular rate and rhythm, S1 and S2 normal, no murmur, rub   or gallop   Breast Exam:   deferred   Abdomen:     Soft, non-tender, bowel sounds active all four quadrants,     no masses, no organomegaly   Genitalia:  deferred   Rectal:  deferred   Extremities:   Extremities normal, atraumatic, no cyanosis or edema   Pulses:   2+ and symmetric all extremities   Skin:   Skin color, texture, turgor normal, no rashes or lesions   Lymph nodes:   Cervical, supraclavicular,  nodes normal   Neurologic:   CNII-XII intact, normal strength, sensation and reflexes     throughout   Foot exam; no lesions or open sores on feet bilate; nails trimmed to appropriate length     POCT A1C  6.5     Assessment:      1. Type 2 diabetes mellitus without complication, unspecified long term insulin use status (HCC)  CBC    Comprehensive Metabolic Panel    Microalbumin, Ur   2. Gastroesophageal reflux disease, esophagitis presence not specified  CBC    Comprehensive Metabolic Panel   3. Hyperlipidemia, unspecified hyperlipidemia type  CBC    Comprehensive Metabolic Panel    Lipid Panel   4. Secondary hypertension  CBC    Comprehensive Metabolic Panel           Plan:    Discussed getting blood sugar to come down  Advised to cut back on the sweets and the bread  Increase physical activities  Reviewed and discussed medication will con't unchanged  Will give orders for routine fasting labs to be completed prior to next visit in November 2016  Advised to con't monitoring BS at home  Keep eye app't next week  RTC 4 mths or prn  Foot exam done in office today  Spent 12 min (>50% of visit) in discussion and answering all questions

## 2014-09-29 ENCOUNTER — Encounter

## 2014-09-29 MED ORDER — DAPAGLIFLOZIN PROPANEDIOL 5 MG PO TABS
5 MG | ORAL_TABLET | ORAL | 3 refills | Status: DC
Start: 2014-09-29 — End: 2015-02-10

## 2014-09-30 ENCOUNTER — Encounter

## 2014-09-30 LAB — CBC
Basophils %: 0.6 %
Basophils Absolute: 0 /??L
Eosinophils %: 3.2 %
Eosinophils Absolute: 0.2 /??L
Hematocrit: 45.4 % (ref 36–46)
Hemoglobin: 14.1 g/dL (ref 12.0–16.0)
Lymphocytes %: 32.7 %
Lymphocytes Absolute: 1.9 /??L
MCH: 28.8 pg
MCHC: 31 g/dL
MCV: 93 fL
Monocytes %: 7.3 %
Monocytes Absolute: 0.4 /??L
Neutrophils %: 56.2 %
Neutrophils Absolute: 3.3 /??L
Platelets: 232 K/??L
RBC: 4.88 10^6/??L
WBC: 5.9 10^3/mL

## 2014-09-30 LAB — LIPID PANEL
Chol/HDL Ratio: 5.9
Cholesterol, Total: 217 mg/dL
HDL: 37 mg/dL (ref 35–70)
LDL Calculated: 157 mg/dL (ref 0–160)
Triglycerides: 115 mg/dL
VLDL: 23 mg/dL

## 2014-09-30 LAB — COMPREHENSIVE METABOLIC PANEL
ALT: 14 U/L
AST: 20 U/L
Albumin: 4.2
Alkaline Phosphatase: 79 U/L
BUN: 25 mg/dL
CO2: 28 mmol/L
Calcium: 9.5 mg/dL
Chloride: 106 mmol/L
Creatinine: 0.7
Gfr Calculated: 84
Glucose: 110 mg/dL
Potassium: 4.3 mmol/L
Sodium: 142 mmol/L
Total Bilirubin: 0.5 mg/dL (ref 0.1–1.4)
Total Protein: 7.4

## 2014-09-30 LAB — MICROALBUMIN, UR
Creatinine, Ur: 75.2 mg/dL
Microalb, Ur: 0.8

## 2014-10-30 MED ORDER — LISINOPRIL 10 MG PO TABS
10 MG | ORAL_TABLET | ORAL | 2 refills | Status: DC
Start: 2014-10-30 — End: 2015-05-06

## 2014-12-03 ENCOUNTER — Inpatient Hospital Stay: Attending: Family Medicine | Primary: Adult Health

## 2014-12-03 ENCOUNTER — Ambulatory Visit: Admit: 2014-12-03 | Primary: Adult Health

## 2014-12-03 ENCOUNTER — Ambulatory Visit: Admit: 2014-12-03 | Discharge: 2014-12-03 | Payer: BLUE CROSS/BLUE SHIELD | Attending: Medical | Primary: Adult Health

## 2014-12-03 DIAGNOSIS — M79601 Pain in right arm: Secondary | ICD-10-CM

## 2014-12-03 MED ORDER — TIZANIDINE HCL 4 MG PO TABS
4 MG | ORAL_TABLET | Freq: Three times a day (TID) | ORAL | 0 refills | Status: DC | PRN
Start: 2014-12-03 — End: 2015-02-04

## 2014-12-03 NOTE — Patient Instructions (Addendum)
Shoulder Stretches: Exercises  Your Care Instructions  Here are some examples of exercises for your shoulder. Start each exercise slowly. Ease off the exercise if you start to have pain.  Your doctor or physical therapist will tell you when you can start these exercises and which ones will work best for you.  How to do the exercises  Note: These exercises should cause you to feel a gentle stretch, but no pain.  Shoulder stretch    1. Stand in a doorway and place one arm against the door frame. Your elbow should be a little higher than your shoulder.  2. Relax your shoulders as you lean forward, allowing your chest and shoulder muscles to stretch. You can also turn your body slightly away from your arm to stretch the muscles even more.  3. Hold for 15 to 30 seconds.  4. Repeat 2 to 4 times with each arm.  Shoulder and chest stretch    Shoulder and chest stretch   1. While sitting, relax your upper body so you slump slightly in your chair.  2. As you breathe in, straighten your back and open your arms out to the sides.  3. Gently pull your shoulder blades back and downward.  4. Hold for 15 to 30 seconds as your breathe normally.  5. Repeat 2 to 4 times.  Overhead stretch    1. Reach up over your head with both arms.  2. Hold for 15 to 30 seconds.  3. Repeat 2 to 4 times.  Follow-up care is a key part of your treatment and safety. Be sure to make and go to all appointments, and call your doctor if you are having problems. It's also a good idea to know your test results and keep a list of the medicines you take.  Where can you learn more?  Go to [email or web address] and sign in to your MyChart account. Enter S254 in the [place] box to learn more about ???Shoulder Stretches: Exercises.???    If you do not have an account, please click on the ???Sign Up Now??? link.  ?? 2006-2016 Healthwise, Incorporated. Care instructions adapted under license by [facility]. This care instruction is for  use with your licensed healthcare professional. If you have questions about a medical condition or this instruction, always ask your healthcare professional. [place] any warranty or liability for your use of this information.  Content Version: 10.9.538570; Current as of: Jul 25, 2013

## 2014-12-03 NOTE — Progress Notes (Signed)
Patient here today for right arm pain with movement    Have you changed or stopped any medications since your last visit including any over-the-counter medicines, vitamins, or herbal medicines? no     Are you taking all your prescribed medications? Yes          If NO, why? -  N/A    Have you seen any other physician or provider since your last visit no    Have you had any other diagnostic tests since your last visit? no    Tobacco use:  Patient  reports that she has never smoked. She has never used smokeless tobacco.   If a smoker, cessation materials provided? NA   1-800-QUIT-NOW [phone])     Medical history Review  Past Medical, Family, and Social History reviewed and does not contribute to the patient presenting condition    Health Maintenance   Topic Date Due   ??? DTaP/Tdap/Td vaccine (1 - Tdap) 11/29/1968   ??? Flu Vaccine (1) 10/05/2014   ??? PNEUMO VAC >= 65 (1 of 2 - PCV13) 11/30/2014   ??? FOOT EXAM  03/11/2015   ??? HEMOGLOBIN A1C  03/25/2015   ??? OPHTHALMOLOGY EXAM  06/03/2015   ??? MICROALBUMINURIA  09/29/2015   ??? LIPIDS  09/29/2015   ??? BREAST CANCER SCREENING  03/10/2016   ??? CERVICAL CANCER SCREENING  06/03/2019   ??? COLON CANCER SCREENING COLONOSCOPY  04/24/2022   ??? ZOSTAVAX VACCINE  Addressed   ??? DEXA SCAN WOMEN (modify frequency per FRAX score)  Completed   ??? HEPATITIS  C SCREENING  Addressed   ??? HIV SCREENING  Addressed               Subjective:      Marissa Sawyer is a 64 y.o. female who presents with right arm pain. The symptoms began several months ago. Symptoms were initiated by repetitive activity, at work with reaching over head. Pain is located upper arm over bicep. Discomfort is described as aching, burning and throbbing. Symptoms are exacerbated by repetitive movements, overhead movements and lying on the shoulder. Evaluation to date: none. Therapy to date includes OTC Ibuprofen which is effective some. 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    Patient's medications, allergies, past medical,  surgical, social and family histories were reviewed and updated as appropriate.     Review of Systems   Constitutional: Negative for chills, diaphoresis, fatigue and fever.   Respiratory: Negative for cough, chest tightness and shortness of breath.    Cardiovascular: Negative for chest pain and leg swelling.   Gastrointestinal: Negative for abdominal pain, constipation, diarrhea and nausea.   Genitourinary: Negative for difficulty urinating and dysuria.   Musculoskeletal: Positive for myalgias.        Right arm and shoulder pain   Neurological: Negative for dizziness, light-headedness and headaches.   All other systems reviewed and are negative.    Objective:        Visit Vitals   ??? BP 110/74 (Site: Left Arm, Position: Sitting, Cuff Size: Large Adult)   ??? Pulse 68   ??? Temp 97.8 ??F (36.6 ??C) (Oral)   ??? Ht 5\' 5"  (1.651 m)   ??? Wt 164 lb (74.4 kg)   ??? SpO2 98%   ??? BMI 27.29 kg/m2     Right shoulder: Normal active ROM, no tenderness, no impingement sign  pain with backwards extension in right shoulder and right bicep   Left shoulder: Normal active ROM, no tenderness,  no impingement sign     Physical Exam   Constitutional: She is oriented to person, place, and time. She appears well-developed and well-nourished.   HENT:   Head: Normocephalic.   Right Ear: External ear normal.   Left Ear: External ear normal.   Mouth/Throat: Oropharynx is clear and moist.   Eyes: Conjunctivae are normal. Pupils are equal, round, and reactive to light.   Neck: Normal range of motion. Neck supple.   Cardiovascular: Normal rate, regular rhythm, normal heart sounds and intact distal pulses.    Pulmonary/Chest: Effort normal and breath sounds normal.   Abdominal: Soft.   Neurological: She is oriented to person, place, and time.   Skin: Skin is warm and dry.   Psychiatric: She has a normal mood and affect.   Nursing note and vitals reviewed.    Assessment:     1. Arm pain, diffuse, right  XR Humerus Right Standard    XR Shoulder Right 2 VW     tiZANidine (ZANAFLEX) 4 MG tablet   2. Chronic right shoulder pain  XR Humerus Right Standard    XR Shoulder Right 2 VW    tiZANidine (ZANAFLEX) 4 MG tablet       Plan:      Natural history and expected course discussed. Questions answered.  Gentle ROM exercises  RICE therapy.  NSAIDs per medication orders.  Plain film x-rays.    Zanaflex per orders   RTC 4 mths or prn

## 2014-12-16 ENCOUNTER — Encounter: Attending: Medical | Primary: Adult Health

## 2014-12-22 ENCOUNTER — Encounter: Attending: Adult Health | Primary: Adult Health

## 2015-02-04 ENCOUNTER — Ambulatory Visit: Admit: 2015-02-04 | Discharge: 2015-02-04 | Payer: MEDICARE | Attending: Gastroenterology | Primary: Adult Health

## 2015-02-04 ENCOUNTER — Encounter: Attending: Gastroenterology | Primary: Adult Health

## 2015-02-04 DIAGNOSIS — K219 Gastro-esophageal reflux disease without esophagitis: Secondary | ICD-10-CM

## 2015-02-04 MED ORDER — OMEPRAZOLE 40 MG PO CPDR
40 MG | ORAL_CAPSULE | Freq: Every day | ORAL | 3 refills | Status: DC
Start: 2015-02-04 — End: 2015-05-06

## 2015-02-04 NOTE — Progress Notes (Signed)
Subjective:      Patient ID: Marissa Sawyer is a 64 y.o. female.    HPI  Dr. Vida Roller, MD our mutual patient Marissa Sawyer was seen  for   1. Gastroesophageal reflux disease, esophagitis presence not specified     .  Patient seen with the symptoms of GERD, and dyspepsia.  Recently she is having worsening of dyspeptic symptoms.  Patient has this issues in the last 5 weeks.  Patient has been taking H2 blocker therapy and is not helping her.  No dysphagia.  No symptoms suggestive of extra esophageal manifestation of GERD.    Patient's IBS symptoms better.  Tolerating diet well.  No melena hematochezia.  Denies taking NSAIDs.  No weight loss.    Past Medical, Family, and Social History reviewed and does contribute to the patient presenting condition.    patient"s PMH/PSH,SH,PSYCH hx, MEDs, ALLERGIES, and ROS was all reviewed and updated ion the appropriate sections    Review of Systems   Constitutional: Negative.    HENT: Positive for trouble swallowing (says it just feels funny).    Eyes: Positive for visual disturbance (wears glasses).   Respiratory: Negative.  Negative for cough, shortness of breath and wheezing.    Cardiovascular: Negative.    Gastrointestinal: Positive for abdominal pain (epigastric pain) and diarrhea. Negative for abdominal distention, anal bleeding, blood in stool, constipation, nausea, rectal pain and vomiting.   Endocrine: Negative.    Genitourinary: Negative.    Musculoskeletal: Positive for arthralgias, back pain and neck pain.   Skin: Negative.    Allergic/Immunologic: Positive for environmental allergies. Negative for food allergies.   Neurological: Negative for dizziness, tremors, weakness, light-headedness, numbness and headaches.   Hematological: Bruises/bleeds easily.   Psychiatric/Behavioral: Positive for sleep disturbance. The patient is nervous/anxious.        Objective:   Physical Exam   Constitutional: She is oriented to person, place, and time. She appears well-developed and  well-nourished.   HENT:   Head: Normocephalic and atraumatic.   No oral lesions   Eyes: Conjunctivae are normal. Pupils are equal, round, and reactive to light. No scleral icterus.   Neck: Normal range of motion. Neck supple. No hepatojugular reflux and no JVD present. No tracheal deviation present. No thyromegaly present.   Cardiovascular: Normal rate, regular rhythm, normal heart sounds and intact distal pulses.    Pulmonary/Chest: Effort normal and breath sounds normal. No respiratory distress. She has no wheezes. She has no rales.   Abdominal: Soft. Bowel sounds are normal. She exhibits no distension, no ascites and no mass. There is no hepatomegaly. There is no tenderness. There is no rebound. No hernia.   Musculoskeletal: She exhibits no edema or tenderness.   No joint swelling   Lymphadenopathy:     She has no cervical adenopathy.   Neurological: She is alert and oriented to person, place, and time. No cranial nerve deficit.   Skin: Skin is warm. No bruising, no ecchymosis and no rash noted. No erythema.   Psychiatric: Thought content normal.   Nursing note and vitals reviewed.      Assessment:      1. Gastroesophageal reflux disease, esophagitis presence not specified             Plan:      Patient is advised PPI therapy for 3 months.  At that time we'll reevaluate the patient, and if continued to have symptoms we'll initiate further workup.  After discussion, patient understood and agreed.

## 2015-02-05 MED ORDER — FLUCONAZOLE 100 MG PO TABS
100 MG | ORAL_TABLET | Freq: Once | ORAL | 0 refills | Status: AC
Start: 2015-02-05 — End: 2015-02-05

## 2015-02-05 NOTE — Telephone Encounter (Signed)
Patient was notified that a script was sent to the pharmacy.

## 2015-02-05 NOTE — Telephone Encounter (Signed)
Patient is having vaginal itching, no dishcharge, no odor. She thinks that it is from the medication that she is on and would like to get something called in.

## 2015-02-05 NOTE — Telephone Encounter (Signed)
sent 

## 2015-02-10 ENCOUNTER — Encounter

## 2015-02-10 MED ORDER — FARXIGA 5 MG PO TABS
5 MG | ORAL_TABLET | ORAL | 3 refills | Status: DC
Start: 2015-02-10 — End: 2015-03-24

## 2015-02-22 MED ORDER — ESOMEPRAZOLE MAGNESIUM 40 MG PO CPDR
40 MG | ORAL_CAPSULE | Freq: Every day | ORAL | 0 refills | Status: DC
Start: 2015-02-22 — End: 2015-03-24

## 2015-02-22 NOTE — Telephone Encounter (Signed)
Receive fax from [facility] that they will not pay for omeprazole after 30 days, needs to change to nexium, dexilant or omeprazole 10 or 20 mg caps.

## 2015-03-24 ENCOUNTER — Ambulatory Visit: Admit: 2015-03-24 | Discharge: 2015-03-24 | Payer: MEDICARE | Attending: Adult Health | Primary: Adult Health

## 2015-03-24 DIAGNOSIS — E119 Type 2 diabetes mellitus without complications: Secondary | ICD-10-CM

## 2015-03-24 LAB — POCT GLYCOSYLATED HEMOGLOBIN (HGB A1C): Hemoglobin A1C: 7 %

## 2015-03-24 MED ORDER — DAPAGLIFLOZIN PROPANEDIOL 5 MG PO TABS
5 MG | ORAL_TABLET | Freq: Every morning | ORAL | 5 refills | Status: DC
Start: 2015-03-24 — End: 2015-05-06

## 2015-03-24 MED ORDER — ESOMEPRAZOLE MAGNESIUM 40 MG PO CPDR
40 MG | ORAL_CAPSULE | Freq: Every day | ORAL | 3 refills | Status: DC
Start: 2015-03-24 — End: 2015-05-06

## 2015-03-24 NOTE — Progress Notes (Signed)
Pt here for 6 month follow up on dm       Visit Information    Have you changed or started any medications since your last visit including any over-the-counter medicines, vitamins, or herbal medicines? no   Have you stopped taking any of your medications? Is so, why? -  no  Are you having any side effects from any of your medications? - no    Have you seen any other physician or provider since your last visit?  no   Have you had any other diagnostic tests since your last visit?  no   Have you been seen in the emergency room and/or had an admission in a hospital since we last saw you?  no   Have you had your routine dental cleaning in the past 6 months?  no     Do you have an active MyChart account? If no, what is the barrier?  Yes    Patient Care Team:  Kris Mouton, MD as PCP - General (Family Medicine)  Feliberto Gottron, MD as Consulting Physician (Gastroenterology)    Medical History Review  Past Medical, Family, and Social History reviewed and does not contribute to the patient presenting condition    Health Maintenance   Topic Date Due   ??? Pneumococcal low/med risk (1 of 2 - PCV13) 11/30/2014   ??? Diabetic foot exam  03/11/2015   ??? Diabetic hemoglobin A1C test  03/25/2015   ??? Flu vaccine (1) 04/04/2017 (Originally 10/05/2014)   ??? DTaP/Tdap/Td vaccine (1 - Tdap) 12/03/2019 (Originally 11/29/1969)   ??? Diabetic retinal exam  06/03/2015   ??? Diabetic microalbuminuria test  09/29/2015   ??? Lipid screen  09/29/2015   ??? Breast cancer screen  03/10/2016   ??? Cervical cancer screen  06/03/2019   ??? Colon cancer screen colonoscopy  04/24/2022   ??? Zostavax vaccine  Addressed   ??? DEXA (modify frequency per FRAX score)  Completed   ??? Hepatitis C screen  Addressed   ??? HIV screen  Addressed

## 2015-03-24 NOTE — Progress Notes (Signed)
Subjective:      Chief Complaint   Patient presents with   ??? 6 Month Follow-Up       Patient ID: Marissa Sawyer is a 65 y.o. female.    Diabetes   She presents for her follow-up diabetic visit. She has type 2 diabetes mellitus. Her disease course has been worsening. There are no hypoglycemic associated symptoms. Pertinent negatives for diabetes include no chest pain, no fatigue, no foot paresthesias, no polydipsia, no polyuria and no weakness. There are no hypoglycemic complications. Symptoms are stable. There are no diabetic complications. Risk factors for coronary artery disease include dyslipidemia, diabetes mellitus, hypertension, sedentary lifestyle and stress. Current diabetic treatment includes oral agent (dual therapy). She is compliant with treatment some of the time. Her weight is stable. When asked about meal planning, she reported none. She rarely participates in exercise. Her home blood glucose trend is increasing steadily. An ACE inhibitor/angiotensin II receptor blocker is being taken. She does not see a podiatrist.Eye exam is current.   she hasn't been taking her farxiga due to insurance issues. Even though insurance issues are improved, she didn't resume it.     Past medical, surgical, social, and family history reviewed along with medications.     Review of Systems   Constitutional: Negative for chills, fatigue and fever.   Eyes: Negative for visual disturbance.   Respiratory: Negative for cough, chest tightness, shortness of breath and wheezing.    Cardiovascular: Negative for chest pain, palpitations and leg swelling.   Endocrine: Negative for polydipsia and polyuria.   Musculoskeletal: Positive for arthralgias.   Neurological: Negative for weakness and numbness.   All other systems reviewed and are negative.      Objective:   Physical Exam   Constitutional: She is oriented to person, place, and time. She appears well-developed and well-nourished. No distress.   HENT:   Head: Normocephalic and  atraumatic.   Cardiovascular: Normal rate, regular rhythm and normal heart sounds.    Pulmonary/Chest: Effort normal and breath sounds normal.   Neurological: She is alert and oriented to person, place, and time.   Skin: Skin is warm and dry. She is not diaphoretic.   Bilateral feet in good repair.  Normal sensation with testing in all points.  Pedal pulses +2.  No open lesions.    Psychiatric: She has a normal mood and affect. Her behavior is normal. Judgment and thought content normal.   Nursing note and vitals reviewed.    Visit Vitals   ??? BP 124/72   ??? Pulse 56   ??? Temp 97.8 ??F (36.6 ??C) (Oral)   ??? Ht 5' 4.17" (1.63 m)   ??? Wt 166 lb (75.3 kg)   ??? SpO2 99%   ??? BMI 28.34 kg/m2       Assessment:      1. Type 2 diabetes mellitus without complication, unspecified long term insulin use status (HCC)  POCT glycosylated hemoglobin (Hb A1C)    dapagliflozin (FARXIGA) 5 MG tablet    HM DIABETES FOOT EXAM    Microalbumin, Ur    Hemoglobin A1C   2. Essential hypertension     3. Pure hypercholesterolemia  Comprehensive Metabolic Panel    CBC    Lipid Panel   4. Gastroesophageal reflux disease, esophagitis presence not specified  esomeprazole (NEXIUM) 40 MG delayed release capsule           Plan:      Medication: continue current meds and refills as above. Resume  farxiga, samples given so she can start it today. A1C did increase while being off of it.  Blood work is fasting overnight, 10-12 hours. Advised the patient they can have water in the morning but no food or sugary drinks. - its due this spring  Follow up: 6 months and as needed.   Complete ordrs as above.   Spent 10 mins answering all questions.   Patient received counseling on the following healthy behaviors: diabetic diet  Patient given educational materials per AVS  Discussed use, benefit, and side effects of prescribed medications. Barriers to medication compliance addressed. All patient questions answered. Pt voiced understanding.

## 2015-03-24 NOTE — Patient Instructions (Addendum)
Learning About Diabetes Food Guidelines  Your Care Instructions  Meal planning is important to manage diabetes. It helps keep your blood sugar at a target level (which you set with your doctor). You don't have to eat special foods. You can eat what your family eats, including sweets once in a while. But you do have to pay attention to how often you eat and how much you eat of certain foods.  You may want to work with a dietitian or a certified diabetes educator (CDE) to help you plan meals and snacks. A dietitian or CDE can also help you lose weight if that is one of your goals.  What should you know about eating carbs?  Managing the amount of carbohydrate (carbs) you eat is an important part of healthy meals when you have diabetes. Carbohydrate is found in many foods.  ?? Learn which foods have carbs. And learn the amounts of carbs in different foods.  ?? Bread, cereal, pasta, and rice have about 15 grams of carbs in a serving. A serving is 1 slice of bread (1 ounce), ?? cup of cooked cereal, or 1/3 cup of cooked pasta or rice.  ?? Fruits have 15 grams of carbs in a serving. A serving is 1 small fresh fruit, such as an apple or orange; ?? of a banana; ?? cup of cooked or canned fruit; ?? cup of fruit juice; 1 cup of melon or raspberries; or 2 tablespoons of dried fruit.  ?? Milk and no-sugar-added yogurt have 15 grams of carbs in a serving. A serving is 1 cup of milk or 2/3 cup of no-sugar-added yogurt.  ?? Starchy vegetables have 15 grams of carbs in a serving. A serving is ?? cup of mashed potatoes or sweet potato; 1 cup winter squash; ?? of a small baked potato; ?? cup of cooked beans; or ?? cup cooked corn or green peas.  ?? Learn how much carbs to eat each day and at each meal. A dietitian or CDE can teach you how to keep track of the amount of carbs you eat. This is called carbohydrate counting.  ?? If you are not sure how to count carbohydrate grams, use the Plate Method to plan meals. It is a good, quick way to make  sure that you have a balanced meal. It also helps you spread carbs throughout the day.  ?? Divide your plate by types of foods. Put non-starchy vegetables on half the plate, meat or other protein food on one-quarter of the plate, and a grain or starchy vegetable in the final quarter of the plate. To this you can add a small piece of fruit and 1 cup of milk or yogurt, depending on how many carbs you are supposed to eat at a meal.  ?? Try to eat about the same amount of carbs at each meal. Do not "save up" your daily allowance of carbs to eat at one meal.  ?? Proteins have very little or no carbs per serving. Examples of proteins are beef, chicken, turkey, fish, eggs, tofu, cheese, cottage cheese, and peanut butter. A serving size of meat is 3 ounces, which is about the size of a deck of cards. Examples of meat substitute serving sizes (equal to 1 ounce of meat) are 1/4 cup of cottage cheese, 1 egg, 1 tablespoon of peanut butter, and ?? cup of tofu.  How can you eat out and still eat healthy?  ?? Learn to estimate the serving sizes of foods that have   carbohydrate. If you measure food at home, it will be easier to estimate the amount in a serving of restaurant food.  ?? If the meal you order has too much carbohydrate (such as potatoes, corn, or baked beans), ask to have a low-carbohydrate food instead. Ask for a salad or green vegetables.  ?? If you use insulin, check your blood sugar before and after eating out to help you plan how much to eat in the future.  ?? If you eat more carbohydrate at a meal than you had planned, take a walk or do other exercise. This will help lower your blood sugar.  What else should you know?  ?? Limit saturated fat, such as the fat from meat and dairy products. This is a healthy choice because people who have diabetes are at higher risk of heart disease. So choose lean cuts of meat and nonfat or low-fat dairy products. Use olive or canola oil instead of butter or shortening when cooking.  ?? Don't  skip meals. Your blood sugar may drop too low if you skip meals and take insulin or certain medicines for diabetes.  ?? Check with your doctor before you drink alcohol. Alcohol can cause your blood sugar to drop too low. Alcohol can also cause a bad reaction if you take certain diabetes medicines.  Follow-up care is a key part of your treatment and safety. Be sure to make and go to all appointments, and call your doctor if you are having problems. It's also a good idea to know your test results and keep a list of the medicines you take.  Where can you learn more?  Go to [email or web address] and sign in to your MyChart account. Enter I147 in the Search Health Information box to learn more about ???Learning About Diabetes Food Guidelines.???    If you do not have an account, please click on the ???Sign Up Now??? link.  ?? 2006-2016 [employer]. Care instructions adapted under license by [facility]. This care instruction is for use with your licensed healthcare professional. If you have questions about a medical condition or this instruction, always ask your healthcare professional. [employer], Inc[employer] disclaims any warranty or liability for your use of this information.  Content Version: 11.0.578772; Current as of: Jul 27, 2014

## 2015-05-06 ENCOUNTER — Ambulatory Visit: Admit: 2015-05-06 | Discharge: 2015-05-06 | Payer: MEDICARE | Attending: Adult Health | Primary: Adult Health

## 2015-05-06 DIAGNOSIS — G8929 Other chronic pain: Secondary | ICD-10-CM

## 2015-05-06 MED ORDER — BUPIVACAINE HCL (PF) 0.25 % IJ SOLN
0.25 % | Freq: Once | INTRAMUSCULAR | Status: AC
Start: 2015-05-06 — End: 2015-05-06
  Administered 2015-05-06: 18:00:00 7.5 mL via INTRA_ARTICULAR

## 2015-05-06 MED ORDER — DAPAGLIFLOZIN PROPANEDIOL 5 MG PO TABS
5 MG | ORAL_TABLET | Freq: Every morning | ORAL | 5 refills | Status: DC
Start: 2015-05-06 — End: 2015-05-07

## 2015-05-06 MED ORDER — RANITIDINE HCL 150 MG PO TABS
150 MG | ORAL_TABLET | Freq: Every day | ORAL | 5 refills | Status: DC
Start: 2015-05-06 — End: 2015-05-13

## 2015-05-06 MED ORDER — LISINOPRIL 10 MG PO TABS
10 MG | ORAL_TABLET | ORAL | 2 refills | Status: DC
Start: 2015-05-06 — End: 2015-05-07

## 2015-05-06 MED ORDER — LIDOCAINE HCL 1 % IJ SOLN
1 % | Freq: Once | INTRAMUSCULAR | Status: AC
Start: 2015-05-06 — End: 2015-05-06
  Administered 2015-05-06: 18:00:00 3 mL via INTRA_ARTICULAR

## 2015-05-06 MED ORDER — OMEPRAZOLE 40 MG PO CPDR
40 MG | ORAL_CAPSULE | Freq: Every day | ORAL | 5 refills | Status: DC
Start: 2015-05-06 — End: 2015-12-01

## 2015-05-06 MED ORDER — TRIAMCINOLONE ACETONIDE 40 MG/ML IJ SUSP
40 MG/ML | Freq: Once | INTRAMUSCULAR | Status: AC
Start: 2015-05-06 — End: 2015-05-06
  Administered 2015-05-06: 18:00:00 40 mg via INTRA_ARTICULAR

## 2015-05-06 MED ORDER — METFORMIN HCL 500 MG PO TABS
500 MG | ORAL_TABLET | Freq: Two times a day (BID) | ORAL | 5 refills | Status: DC
Start: 2015-05-06 — End: 2015-05-07

## 2015-05-06 NOTE — Progress Notes (Signed)
Pt here for right shoulder injection        Visit Information    Have you changed or started any medications since your last visit including any over-the-counter medicines, vitamins, or herbal medicines? no   Have you stopped taking any of your medications? Is so, why? -  no  Are you having any side effects from any of your medications? - no    Have you seen any other physician or provider since your last visit?  no   Have you had any other diagnostic tests since your last visit?  no   Have you been seen in the emergency room and/or had an admission in a hospital since we last saw you?  no   Have you had your routine dental cleaning in the past 6 months?  no     Do you have an active MyChart account? If no, what is the barrier?  Yes    Patient Care Team:  Kris Mouton, MD as PCP - General (Family Medicine)  Feliberto Gottron, MD as Consulting Physician (Gastroenterology)    Medical History Review  Past Medical, Family, and Social History reviewed and does not contribute to the patient presenting condition    Health Maintenance   Topic Date Due   ??? Pneumococcal low/med risk (1 of 2 - PCV13) 11/30/2014   ??? Flu vaccine (1) 04/04/2017 (Originally 10/05/2014)   ??? DTaP/Tdap/Td vaccine (1 - Tdap) 12/03/2019 (Originally 11/29/1969)   ??? Diabetic hemoglobin A1C test  09/21/2015   ??? Diabetic microalbuminuria test  09/29/2015   ??? Lipid screen  09/29/2015   ??? Diabetic retinal exam  09/30/2015   ??? Breast cancer screen  03/10/2016   ??? Diabetic foot exam  03/23/2016   ??? Cervical cancer screen  06/03/2019   ??? Colon cancer screen colonoscopy  04/24/2022   ??? Zostavax vaccine  Addressed   ??? DEXA (modify frequency per FRAX score)  Completed   ??? Hepatitis C screen  Addressed   ??? HIV screen  Addressed

## 2015-05-06 NOTE — Patient Instructions (Signed)
Shoulder Arthritis: Exercises  Your Care Instructions  Here are some examples of typical rehabilitation exercises for your condition. Start each exercise slowly. Ease off the exercise if you start to have pain.  Your doctor or physical therapist will tell you when you can start these exercises and which ones will work best for you.  How to do the exercises  Shoulder flexion (lying down)    Note: To make a wand for this exercise, use a piece of PVC pipe or a broom handle with the broom removed. Make the wand about a foot wider than your shoulders.  1. Lie on your back, holding a wand with both hands. Your palms should face down as you hold the wand.  2. Keeping your elbows straight, slowly raise your arms over your head. Raise them until you feel a stretch in your shoulders, upper back, and chest.  3. Hold for 15 to 30 seconds.  4. Repeat 2 to 4 times.  Shoulder rotation (lying down)    Note: To make a wand for this exercise, use a piece of PVC pipe or a broom handle with the broom removed. Make the wand about a foot wider than your shoulders.  1. Lie on your back. Hold a wand with both hands with your elbows bent and palms up.  2. Keep your elbows close to your body, and move the wand across your body toward the sore arm.  3. Hold for 8 to 12 seconds.  4. Repeat 2 to 4 times.  Shoulder internal rotation with towel    1. Hold a towel above and behind your head with the arm that is not sore.  2. With your sore arm, reach behind your back and grasp the towel.  3. With the arm above your head, pull the towel upward. Do this until you feel a stretch on the front and outside of your sore shoulder.  4. Hold 15 to 30 seconds.  5. Repeat 2 to 4 times.  Shoulder blade squeeze    1. Stand with your arms at your sides, and squeeze your shoulder blades together. Do not raise your shoulders up as you squeeze.  2. Hold 6 seconds.  3. Repeat 8 to 12 times.  Resisted rows    Note: For this exercise, you will need elastic  exercise material, such as surgical tubing or Thera-Band.  1. Put the band around a solid object at about waist level. (A bedpost will work well.) Each hand should hold an end of the band.  2. With your elbows at your sides and bent to 90 degrees, pull the band back. Your shoulder blades should move toward each other. Return to the starting position.  3. Repeat 8 to 12 times.  External rotator strengthening exercise    1. Start by tying a piece of elastic exercise material to a doorknob. You can use surgical tubing or Thera-Band. (You may also hold one end of the band in each hand.)  2. Stand or sit with your shoulder relaxed and your elbow bent 90 degrees. Your upper arm should rest comfortably against your side. Squeeze a rolled towel between your elbow and your body for comfort. This will help keep your arm at your side.  3. Hold one end of the elastic band with the hand of the painful arm.  4. Start with your forearm across your belly. Slowly rotate the forearm out away from your body. Keep your elbow and upper arm tucked against the towel roll   or the side of your body until you begin to feel tightness in your shoulder. Slowly move your arm back to where you started.  5. Repeat 8 to 12 times.  Internal rotator strengthening exercise    1. Start by tying a piece of elastic exercise material to a doorknob. You can use surgical tubing or Thera-Band.  2. Stand or sit with your shoulder relaxed and your elbow bent 90 degrees. Your upper arm should rest comfortably against your side. Squeeze a rolled towel between your elbow and your body for comfort. This will help keep your arm at your side.  3. Hold one end of the elastic band in the hand of the painful arm.  4. Slowly rotate your forearm toward your body until it touches your belly. Slowly move it back to where you started.  5. Keep your elbow and upper arm firmly tucked against the towel roll or at your side.  6. Repeat 8 to 12 times.  Pendulum swing    Note: If  you have pain in your back, do not do this exercise.  1. Hold on to a table or the back of a chair with your good arm. Then bend forward a little and let your sore arm hang straight down. This exercise does not use the arm muscles. Rather, use your legs and your hips to create movement that makes your arm swing freely.  2. Use the movement from your hips and legs to guide the slightly swinging arm back and forth like a pendulum (or elephant trunk). Then guide it in circles that start small (about the size of a dinner plate). Make the circles a bit larger each day, as your pain allows.  3. Do this exercise for 5 minutes, 5 to 7 times each day.  4. As you have less pain, try bending over a little farther to do this exercise. This will increase the amount of movement at your shoulder.  Follow-up care is a key part of your treatment and safety. Be sure to make and go to all appointments, and call your doctor if you are having problems. It's also a good idea to know your test results and keep a list of the medicines you take.  Where can you learn more?  Go to [email or web address] and sign in to your MyChart account. Enter H562 in the [place] box to learn more about "Shoulder Arthritis: Exercises."     If you do not have an account, please click on the "Sign Up Now" link.  Current as of: Jul 27, 2014  Content Version: 11.1  ?? 2006-2016 [employer]. Care instructions adapted under license by [facility]. If you have questions about a medical condition or this instruction, always ask your healthcare professional. [place] any warranty or liability for your use of this information.

## 2015-05-06 NOTE — Progress Notes (Signed)
Subjective:      Chief Complaint   Patient presents with   ??? Injections     Patient ID: Marissa Sawyer is a 65 y.o. female.    HPI Comments:  Marissa Sawyer is a 65 y.o. female who presents with right arm pain. The symptoms began several months ago. Symptoms were initiated by repetitive activity, at work with reaching over head. Pain is located upper arm over bicep. Discomfort is described as aching, burning and throbbing. Symptoms are exacerbated by repetitive movements, overhead movements and lying on the shoulder. Evaluation to date: xray. Therapy to date includes OTC Ibuprofen which is effective some. 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 Shoulder xray shows + arthritis. She is here for joint injection.     Past medical, surgical, social, and family history reviewed along with medications.     Review of Systems   Constitutional: Negative for chills and fever.   Musculoskeletal: Positive for arthralgias and myalgias.   All other systems reviewed and are negative.      Objective:   Physical Exam   Constitutional: She is oriented to person, place, and time. She appears well-developed and well-nourished. No distress.   HENT:   Head: Normocephalic and atraumatic.   Pulmonary/Chest: Effort normal.   Musculoskeletal:        Right shoulder: She exhibits decreased range of motion, tenderness, bony tenderness and pain. She exhibits no swelling, no effusion, no crepitus, no deformity, no laceration, no spasm and normal pulse.   Neurological: She is alert and oriented to person, place, and time.   Skin: Skin is warm and dry. She is not diaphoretic.   Psychiatric: She has a normal mood and affect. Her behavior is normal. Judgment and thought content normal.   Nursing note and vitals reviewed.    Visit Vitals   ??? BP 120/60   ??? Temp 97.5 ??F (36.4 ??C) (Oral)   ??? Ht 5' 4.17" (1.63 m)   ??? Wt 167 lb (75.8 kg)   ??? BMI 28.51 kg/m2       Shoulder Injection Procedure Note    Pre-operative Diagnosis: right shoulder  pain    Post-operative Diagnosis: same    Indications: Symptom relief from osteoarthritis    Anesthesia: not required     Procedure Details     Verbal consent was obtained for the procedure. The shoulder was prepped with iodine. Using a 22 gauge needle the glenohumeral joint is injected with 3 mL 1% lidocaine, 76ml of 0.25% marcaine and 1 mL of triamcinolone (KENALOG) 40mg /ml under the posterior aspect of the acromion. The injection site was cleansed with topical isopropyl alcohol and a dressing was applied.    Complications:  None; patient tolerated the procedure well.  Assessment:      1. Chronic right shoulder pain  bupivacaine (PF) (MARCAINE) 0.25 % injection 7.5 mg    lidocaine 1 % injection 3 mL    triamcinolone acetonide (KENALOG-40) injection 40 mg    20610 - PR DRAIN/INJECT LARGE JOINT/BURSA   2. Arthritis of right shoulder region  bupivacaine (PF) (MARCAINE) 0.25 % injection 7.5 mg    lidocaine 1 % injection 3 mL    triamcinolone acetonide (KENALOG-40) injection 40 mg    20610 - PR DRAIN/INJECT LARGE JOINT/BURSA           Plan:      Discussed steroids can increase blood sugars. Watch sweets and carbs. Can take an extra metformin if needed daily. Monitor blood sugars  Injection given, see procedure note  Refills given per pt request  Educational materials given on AVS  Patient verbalized understanding of all instructions given.  Keep routine follow up appts

## 2015-05-07 MED ORDER — DAPAGLIFLOZIN PROPANEDIOL 5 MG PO TABS
5 MG | ORAL_TABLET | Freq: Every morning | ORAL | 2 refills | Status: DC
Start: 2015-05-07 — End: 2015-11-24

## 2015-05-07 MED ORDER — METFORMIN HCL 500 MG PO TABS
500 MG | ORAL_TABLET | Freq: Two times a day (BID) | ORAL | 2 refills | Status: DC
Start: 2015-05-07 — End: 2015-12-01

## 2015-05-07 MED ORDER — LISINOPRIL 10 MG PO TABS
10 MG | ORAL_TABLET | ORAL | 2 refills | Status: DC
Start: 2015-05-07 — End: 2016-04-25

## 2015-05-13 ENCOUNTER — Ambulatory Visit: Admit: 2015-05-13 | Discharge: 2015-05-13 | Payer: MEDICARE | Attending: Gastroenterology | Primary: Adult Health

## 2015-05-13 DIAGNOSIS — K219 Gastro-esophageal reflux disease without esophagitis: Secondary | ICD-10-CM

## 2015-05-13 NOTE — Progress Notes (Signed)
Subjective:      Patient ID: Marissa Sawyer is a 65 y.o. female.    HPI   Dr. Vida Roller, MD our mutual patient ATHERINE DUDEK was seen  for   1. Gastroesophageal reflux disease, esophagitis presence not specified    2. Diverticulosis of large intestine without hemorrhage    3. History of gastric polyp     .  Patient seen for follow-up of GERD symptoms.    Recently she is having exacerbation of GERD.  She has been taking PPI therapy and not getting better.  She has dyspepsia also.  No weight loss.  Has nausea.  No emesis.  No dysphagia.  No odynophagia.    In the past she had a EGD done and revealed flat polyp in the Stomach.     Her records reviewed and in 2012 she had a colonoscopy done and was nonspecific.      Past Medical, Family, and Social History reviewed and does contribute to the patient presenting condition.    patient"s PMH/PSH,SH,PSYCH hx, MEDs, ALLERGIES, and ROS was all reviewed and updated ion the appropriate sections      Review of Systems   Constitutional: Negative.  Negative for activity change, appetite change, chills, diaphoresis, fatigue, fever and unexpected weight change.   HENT: Negative.  Negative for congestion, dental problem, drooling, ear discharge, ear pain, facial swelling, hearing loss, mouth sores, nosebleeds, postnasal drip, rhinorrhea, sinus pressure, sneezing, sore throat, tinnitus, trouble swallowing and voice change.    Eyes: Positive for visual disturbance (wears glasses).   Respiratory: Negative.  Negative for cough, shortness of breath and wheezing.    Cardiovascular: Negative.  Negative for chest pain, palpitations and leg swelling.   Gastrointestinal: Positive for abdominal pain (occasional cramping) and diarrhea (occasional). Negative for abdominal distention, anal bleeding, blood in stool, constipation, nausea, rectal pain and vomiting.   Endocrine: Negative.  Negative for polydipsia, polyphagia and polyuria.   Genitourinary: Negative.  Negative for difficulty  urinating, dysuria, vaginal bleeding and vaginal discharge.   Musculoskeletal: Positive for arthralgias, back pain and neck pain.   Skin: Negative.    Allergic/Immunologic: Positive for environmental allergies. Negative for food allergies.   Neurological: Negative.  Negative for dizziness, tremors, seizures, syncope, facial asymmetry, speech difficulty, weakness, light-headedness, numbness and headaches.   Hematological: Bruises/bleeds easily.   Psychiatric/Behavioral: Positive for sleep disturbance. The patient is nervous/anxious.        Objective:   Physical Exam   Constitutional: She is oriented to person, place, and time. She appears well-developed and well-nourished.   HENT:   Head: Normocephalic and atraumatic.   No oral lesions   Eyes: Conjunctivae are normal. Pupils are equal, round, and reactive to light. No scleral icterus.   Neck: Normal range of motion. Neck supple. No hepatojugular reflux and no JVD present. No tracheal deviation present. No thyromegaly present.   Cardiovascular: Normal rate, regular rhythm, normal heart sounds and intact distal pulses.    Pulmonary/Chest: Effort normal and breath sounds normal. No respiratory distress. She has no wheezes. She has no rales.   Abdominal: Soft. Bowel sounds are normal. She exhibits no distension, no ascites and no mass. There is no hepatomegaly. There is no tenderness. There is no rebound. No hernia.   Genitourinary: Rectal exam shows guaiac negative stool.   Musculoskeletal: She exhibits no edema or tenderness.   No joint swelling   Lymphadenopathy:     She has no cervical adenopathy.   Neurological: She is  alert and oriented to person, place, and time. No cranial nerve deficit.   Skin: Skin is warm. No bruising, no ecchymosis and no rash noted. No erythema.   Psychiatric: Thought content normal.   Nursing note and vitals reviewed.      Assessment:      1. Gastroesophageal reflux disease, esophagitis presence not specified     2. Diverticulosis of large  intestine without hemorrhage     3. History of gastric polyp  EGD           Plan:      Patient has GERD symptoms not getting better with PPI therapy.  She had a flat polyp in the past in the stomach.  She needs EGD to evaluate her symptoms and to evaluate the polyp.    Patient is advised to continue PPI therapy and antireflux measures.  Basing on the results of EGD findings, will plan further management of her symptoms.

## 2015-06-01 ENCOUNTER — Inpatient Hospital Stay: Payer: MEDICARE

## 2015-06-01 MED ORDER — MIDAZOLAM HCL 2 MG/2ML IJ SOLN
2 MG/ML | INTRAMUSCULAR | Status: DC | PRN
Start: 2015-06-01 — End: 2015-06-01
  Administered 2015-06-01: 13:00:00 2 via INTRAVENOUS
  Administered 2015-06-01 (×2): 1 via INTRAVENOUS

## 2015-06-01 MED ORDER — FENTANYL CITRATE (PF) 100 MCG/2ML IJ SOLN
100 MCG/2ML | INTRAMUSCULAR | Status: DC | PRN
Start: 2015-06-01 — End: 2015-06-01
  Administered 2015-06-01: 13:00:00 100 via INTRAVENOUS

## 2015-06-01 MED ORDER — SODIUM CHLORIDE 0.9 % IV SOLN
0.9 % | INTRAVENOUS | Status: DC
Start: 2015-06-01 — End: 2015-06-01
  Administered 2015-06-01: 13:00:00 via INTRAVENOUS

## 2015-06-01 MED ORDER — BUTAMBEN-TETRACAINE-BENZOCAINE 2-2-14 % EX AERO
2-2-14 % | CUTANEOUS | Status: DC | PRN
Start: 2015-06-01 — End: 2015-06-01
  Administered 2015-06-01: 13:00:00 1 via TOPICAL

## 2015-06-01 NOTE — Progress Notes (Signed)
bloodsugar 128
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
## 2015-06-01 NOTE — Op Note (Signed)
ESOPHAGOGASTRODUODENOSCOPY   ( EGD )  DATE OF PROCEDURE: 06/01/2015     SURGEON: Jane Canary, MD    ASSISTANT: None    PREOPERATIVE DIAGNOSIS: epigastric pain and GERD,polyps in stomach    POSTOPERATIVE DIAGNOSIS: no lesions seen    OPERATION: Upper GI endoscopy with Biopsy    ANESTHESIA: Moderate Sedation     ESTIMATED BLOOD LOSS: None    COMPLICATIONS: None.     SPECIMENS:  Was Obtained: gastric and esophagus    HISTORY: The patient is a 65 y.o. year old female with history of above preop diagnosis.  I recommended esophagogastroduodenoscopy with possible biopsy and I explained the risk, benefits, expected outcome, and alternatives to the procedure.  Risks included but are not limited to bleeding, infection, respiratory distress, hypotension, and perforation of the esophagus, stomach, or duodenum.  Patient understands and is in agreement.    PROCEDURE: The patient was given IV conscious sedation.  The patient's SPO2 remained above 90% throughout the procedure. Cetacaine spray given.  Patient placed in left lateral position.  Olympus GIF 160 videogastroscope was inserted orally under vision into the esophagus without difficulty and advanced into the stomach then through the pylorus up to the second part of duodenum.      Findings:    Retropharyngeal area was grossly normal appearing    Esophagus: normal, random biopsies taken to evaluate microscopic disease    Stomach:    Fundus and Cardia Examined in Retroflexed View: normal    Body: normal    Antrum: normal    No recurrent polyps seen  Random biopsies taken to evaluate H.pylori  Duodenum:     Descending: normal    Bulb: normal    While withdrawing the scope the above findings were verified and the scope was removed.  The patient tolerated the procedure well.     Recommendations/Plan:   1. F/U Biopsies  2. F/U In Office as instructed  3. Discussed with the family    Electronically signed by Jane Canary, MD  on 06/01/2015 at 9:22 AM

## 2015-06-04 LAB — SURGICAL PATHOLOGY

## 2015-06-04 NOTE — Telephone Encounter (Signed)
Called patient to schedule 6 wk follow-up egd with Dr. Ailene Rud  Thanks

## 2015-06-17 ENCOUNTER — Encounter: Attending: Gastroenterology | Primary: Adult Health

## 2015-08-10 ENCOUNTER — Ambulatory Visit: Admit: 2015-08-10 | Discharge: 2015-08-10 | Payer: MEDICARE | Attending: Gastroenterology | Primary: Adult Health

## 2015-08-10 DIAGNOSIS — Z8719 Personal history of other diseases of the digestive system: Secondary | ICD-10-CM

## 2015-08-10 NOTE — Progress Notes (Signed)
Subjective:      Patient ID: Marissa Sawyer is a 65 y.o. female.    HPI   Dr. Vida Roller, MD our mutual patient Marissa Sawyer was seen  for   1. History of gastric polyp    2. Gastroesophageal reflux disease, esophagitis presence not specified    3. Diverticulosis of large intestine without hemorrhage      [name] Hence, 65 year old patient seen in the office with the symptoms of diarrhea.    She states that she has 4-5 bowel movements a day.  Liquid consistency.  She has intermittent incontinence.  No nocturnal bowel movements.  No weight loss.  Patient has this symptom in the last couple of months.  She is also known to have questionable allergy to milk.  She also known to have abdominal bloating, gurgling sounds.    Her workup in the next past negative.  At present she is retired and denies any stress.      Past Medical, Family, and Social History reviewed and does contribute to the patient presenting condition.    patient"s PMH/PSH,SH,PSYCH hx, MEDs, ALLERGIES, and ROS was all reviewed and updated ion the appropriate sections      Review of Systems   Constitutional: Negative.  Negative for activity change, appetite change, chills, diaphoresis, fatigue, fever and unexpected weight change.   HENT: Negative.  Negative for congestion, dental problem, drooling, ear discharge, ear pain, facial swelling, hearing loss, mouth sores, nosebleeds, postnasal drip, rhinorrhea, sinus pressure, sneezing, sore throat, tinnitus, trouble swallowing and voice change.    Eyes: Positive for visual disturbance (wears glasses).   Respiratory: Negative.  Negative for cough, shortness of breath and wheezing.    Cardiovascular: Negative.  Negative for chest pain, palpitations and leg swelling.   Gastrointestinal: Positive for diarrhea (comes and goes). Negative for abdominal distention, abdominal pain, anal bleeding, blood in stool, constipation, nausea, rectal pain and vomiting.   Endocrine: Negative.  Negative for polydipsia, polyphagia  and polyuria.   Genitourinary: Negative.  Negative for difficulty urinating, dysuria, vaginal bleeding and vaginal discharge.   Musculoskeletal: Positive for arthralgias, back pain and neck pain.   Skin: Negative.    Allergic/Immunologic: Positive for environmental allergies. Negative for food allergies.   Neurological: Negative.  Negative for dizziness, tremors, seizures, syncope, facial asymmetry, speech difficulty, weakness, light-headedness, numbness and headaches.   Hematological: Negative for adenopathy. Bruises/bleeds easily.   Psychiatric/Behavioral: Positive for sleep disturbance. The patient is nervous/anxious.        Objective:   Physical Exam   Constitutional: She is oriented to person, place, and time. She appears well-developed and well-nourished.   HENT:   Head: Normocephalic and atraumatic.   No oral lesions   Eyes: Conjunctivae are normal. Pupils are equal, round, and reactive to light. No scleral icterus.   Neck: Normal range of motion. Neck supple. No hepatojugular reflux and no JVD present. No tracheal deviation present. No thyromegaly present.   Cardiovascular: Normal rate, regular rhythm, normal heart sounds and intact distal pulses.    Pulmonary/Chest: Effort normal and breath sounds normal. No respiratory distress. She has no wheezes. She has no rales.   Abdominal: Soft. Bowel sounds are normal. She exhibits no distension, no ascites and no mass. There is no hepatomegaly. There is no tenderness. There is no rebound. No hernia.   Musculoskeletal: She exhibits no edema or tenderness.   No joint swelling   Lymphadenopathy:     She has no cervical adenopathy.   Neurological:  She is alert and oriented to person, place, and time. No cranial nerve deficit.   Skin: Skin is warm. No bruising, no ecchymosis and no rash noted. No erythema.   Psychiatric: Thought content normal.   Nursing note and vitals reviewed.      Assessment:      1. History of gastric polyp     2. Gastroesophageal reflux disease,  esophagitis presence not specified     3. Diverticulosis of large intestine without hemorrhage     4. Diarrhea, unspecified type  Tissue Transglutaminase, IgA    TSH without Reflex           Plan:        Patient is advised to have tissue transglutaminase antibody and TSH levels.  Discussed with the patient regarding possibility of IBS and long-term management.  Advised to stop drinking milk and to avoid dairy products.    Advised symptomatic treatment with Imodium.  Advised to see me in the next couple of months.

## 2015-08-27 LAB — TSH: TSH: 0.643 u[IU]/mL (ref 0.40–4.40)

## 2015-08-30 LAB — TISSUE TRANSGLUTAMINASE ANTIBODY IGA W/ REFLEX: Tissue Transglutaminase IgA: <1.2 U/ML

## 2015-08-31 ENCOUNTER — Encounter

## 2015-09-01 ENCOUNTER — Encounter

## 2015-09-01 MED ORDER — BUSPIRONE HCL 10 MG PO TABS
10 MG | ORAL_TABLET | Freq: Three times a day (TID) | ORAL | 1 refills | Status: DC | PRN
Start: 2015-09-01 — End: 2017-02-02

## 2015-09-21 ENCOUNTER — Ambulatory Visit: Admit: 2015-09-21 | Discharge: 2015-09-21 | Payer: MEDICARE | Attending: Adult Health | Primary: Adult Health

## 2015-09-21 ENCOUNTER — Encounter: Attending: Adult Health | Primary: Adult Health

## 2015-09-21 ENCOUNTER — Inpatient Hospital Stay: Payer: MEDICARE | Primary: Adult Health

## 2015-09-21 DIAGNOSIS — E7800 Pure hypercholesterolemia, unspecified: Secondary | ICD-10-CM

## 2015-09-21 DIAGNOSIS — E118 Type 2 diabetes mellitus with unspecified complications: Secondary | ICD-10-CM

## 2015-09-21 LAB — COMPREHENSIVE METABOLIC PANEL
ALT: 18 U/L (ref 5–33)
AST: 22 U/L (ref <32)
Albumin/Globulin Ratio: 1.2 (ref 1.0–2.5)
Albumin: 4.3 g/dL (ref 3.5–5.2)
Alkaline Phosphatase: 68 U/L (ref 35–104)
Anion Gap: 16 mmol/L (ref 9–17)
BUN: 20 mg/dL (ref 8–23)
CO2: 23 mmol/L (ref 20–31)
Calcium: 9.3 mg/dL (ref 8.6–10.4)
Chloride: 105 mmol/L (ref 98–107)
Creatinine: 0.73 mg/dL (ref 0.50–0.90)
GFR African American: >60 mL/min (ref >60)
GFR Non-African American: >60 mL/min (ref >60)
Glucose: 120 mg/dL — ABNORMAL HIGH (ref 70–99)
Potassium: 4 mmol/L (ref 3.7–5.3)
Sodium: 144 mmol/L (ref 135–144)
Total Bilirubin: 0.44 mg/dL (ref 0.3–1.2)
Total Protein: 8 g/dL (ref 6.4–8.3)

## 2015-09-21 LAB — MICROALBUMIN, UR
Creatinine, Ur: 91.3 mg/dL (ref 28.0–217.0)
Microalb, Ur: <12 mg/L (ref <21)
Microalb/Crt. Ratio: 13 mcg/mg creat (ref <25)

## 2015-09-21 LAB — LIPID PANEL
Chol/HDL Ratio: 5.8 — ABNORMAL HIGH (ref <5)
Cholesterol: 215 mg/dL — ABNORMAL HIGH (ref <200)
HDL: 37 mg/dL — ABNORMAL LOW (ref >40)
LDL Cholesterol: 135 mg/dL — ABNORMAL HIGH (ref 0–130)
Triglycerides: 217 mg/dL — ABNORMAL HIGH (ref <150)

## 2015-09-21 LAB — CBC
Hematocrit: 42 % (ref 36–46)
Hemoglobin: 14.1 g/dL (ref 12.0–16.0)
MCH: 28.9 pg (ref 26–34)
MCHC: 33.5 g/dL (ref 31–37)
MCV: 86.3 fL (ref 80–100)
MPV: 8.1 fL (ref 6.0–12.0)
Platelets: 258 10*3/uL (ref 140–450)
RBC: 4.86 m/uL (ref 4.0–5.2)
RDW: 14.3 % (ref 12.5–15.4)
WBC: 6.8 10*3/uL (ref 3.5–11.0)

## 2015-09-21 LAB — POCT GLYCOSYLATED HEMOGLOBIN (HGB A1C): Hemoglobin A1C: 6.8 %

## 2015-09-21 LAB — HEMOGLOBIN A1C
Estimated Avg Glucose: 143 mg/dL
Hemoglobin A1C: 6.6 % — ABNORMAL HIGH (ref 4.0–6.0)

## 2015-09-21 NOTE — Progress Notes (Signed)
Pt here for 6 month follow on dm lipids and htn     Pt would like to talk to you about getting the pneumo vaccine       Visit Information    Have you changed or started any medications since your last visit including any over-the-counter medicines, vitamins, or herbal medicines? no   Have you stopped taking any of your medications? Is so, why? -  no  Are you having any side effects from any of your medications? - no    Have you seen any other physician or provider since your last visit?  yes - gastro    Have you had any other diagnostic tests since your last visit?  yes - labs    Have you been seen in the emergency room and/or had an admission in a hospital since we last saw you?  no   Have you had your routine dental cleaning in the past 6 months?  no     Do you have an active MyChart account? If no, what is the barrier?  Yes    Patient Care Team:  Kris Mouton, MD as PCP - General (Family Medicine)  Feliberto Gottron, MD as Consulting Physician (Gastroenterology)    Medical History Review  Past Medical, Family, and Social History reviewed and does not contribute to the patient presenting condition    Health Maintenance   Topic Date Due   ??? Pneumococcal low/med risk (1 of 2 - PCV13) 11/30/2014   ??? Diabetic hemoglobin A1C test  09/21/2015   ??? Diabetic microalbuminuria test  09/29/2015   ??? Lipid screen  09/29/2015   ??? Diabetic retinal exam  09/30/2015   ??? Flu vaccine (1) 04/04/2017 (Originally 10/05/2015)   ??? DTaP/Tdap/Td vaccine (1 - Tdap) 12/03/2019 (Originally 11/29/1969)   ??? Breast cancer screen  03/10/2016   ??? Diabetic foot exam  03/23/2016   ??? Cervical cancer screen  06/03/2019   ??? Colon cancer screen colonoscopy  04/24/2022   ??? Zostavax vaccine  Addressed   ??? DEXA (modify frequency per FRAX score)  Completed   ??? Hepatitis C screen  Addressed   ??? HIV screen  Addressed

## 2015-09-21 NOTE — Patient Instructions (Signed)
Learning About Diabetes Food Guidelines  Your Care Instructions  Meal planning is important to manage diabetes. It helps keep your blood sugar at a target level (which you set with your doctor). You don't have to eat special foods. You can eat what your family eats, including sweets once in a while. But you do have to pay attention to how often you eat and how much you eat of certain foods.  You may want to work with a dietitian or a certified diabetes educator (CDE) to help you plan meals and snacks. A dietitian or CDE can also help you lose weight if that is one of your goals.  What should you know about eating carbs?  Managing the amount of carbohydrate (carbs) you eat is an important part of healthy meals when you have diabetes. Carbohydrate is found in many foods.  ?? Learn which foods have carbs. And learn the amounts of carbs in different foods.  ?? Bread, cereal, pasta, and rice have about 15 grams of carbs in a serving. A serving is 1 slice of bread (1 ounce), ?? cup of cooked cereal, or 1/3 cup of cooked pasta or rice.  ?? Fruits have 15 grams of carbs in a serving. A serving is 1 small fresh fruit, such as an apple or orange; ?? of a banana; ?? cup of cooked or canned fruit; ?? cup of fruit juice; 1 cup of melon or raspberries; or 2 tablespoons of dried fruit.  ?? Milk and no-sugar-added yogurt have 15 grams of carbs in a serving. A serving is 1 cup of milk or 2/3 cup of no-sugar-added yogurt.  ?? Starchy vegetables have 15 grams of carbs in a serving. A serving is ?? cup of mashed potatoes or sweet potato; 1 cup winter squash; ?? of a small baked potato; ?? cup of cooked beans; or ?? cup cooked corn or green peas.  ?? Learn how much carbs to eat each day and at each meal. A dietitian or CDE can teach you how to keep track of the amount of carbs you eat. This is called carbohydrate counting.  ?? If you are not sure how to count carbohydrate grams, use the Plate Method to plan meals. It is a good, quick way to make  sure that you have a balanced meal. It also helps you spread carbs throughout the day.  ?? Divide your plate by types of foods. Put non-starchy vegetables on half the plate, meat or other protein food on one-quarter of the plate, and a grain or starchy vegetable in the final quarter of the plate. To this you can add a small piece of fruit and 1 cup of milk or yogurt, depending on how many carbs you are supposed to eat at a meal.  ?? Try to eat about the same amount of carbs at each meal. Do not "save up" your daily allowance of carbs to eat at one meal.  ?? Proteins have very little or no carbs per serving. Examples of proteins are beef, chicken, turkey, fish, eggs, tofu, cheese, cottage cheese, and peanut butter. A serving size of meat is 3 ounces, which is about the size of a deck of cards. Examples of meat substitute serving sizes (equal to 1 ounce of meat) are 1/4 cup of cottage cheese, 1 egg, 1 tablespoon of peanut butter, and ?? cup of tofu.  How can you eat out and still eat healthy?  ?? Learn to estimate the serving sizes of foods that have   carbohydrate. If you measure food at home, it will be easier to estimate the amount in a serving of restaurant food.  ?? If the meal you order has too much carbohydrate (such as potatoes, corn, or baked beans), ask to have a low-carbohydrate food instead. Ask for a salad or green vegetables.  ?? If you use insulin, check your blood sugar before and after eating out to help you plan how much to eat in the future.  ?? If you eat more carbohydrate at a meal than you had planned, take a walk or do other exercise. This will help lower your blood sugar.  What else should you know?  ?? Limit saturated fat, such as the fat from meat and dairy products. This is a healthy choice because people who have diabetes are at higher risk of heart disease. So choose lean cuts of meat and nonfat or low-fat dairy products. Use olive or canola oil instead of butter or shortening when cooking.  ?? Don't  skip meals. Your blood sugar may drop too low if you skip meals and take insulin or certain medicines for diabetes.  ?? Check with your doctor before you drink alcohol. Alcohol can cause your blood sugar to drop too low. Alcohol can also cause a bad reaction if you take certain diabetes medicines.  Follow-up care is a key part of your treatment and safety. Be sure to make and go to all appointments, and call your doctor if you are having problems. It's also a good idea to know your test results and keep a list of the medicines you take.  Where can you learn more?  Go to [email or web address] and sign in to your MyChart account. Enter I147 in the Search Health Information box to learn more about "Learning About Diabetes Food Guidelines."     If you do not have an account, please click on the "Sign Up Now" link.  Current as of: Jul 27, 2014  Content Version: 11.2  ?? 2006-2017 [employer]. Care instructions adapted under license by [facility]. If you have questions about a medical condition or this instruction, always ask your healthcare professional. [employer] disclaims any warranty or liability for your use of this information.

## 2015-09-21 NOTE — Progress Notes (Signed)
Subjective:      Patient ID: Marissa Sawyer is a 65 y.o. female.    Diabetes   She presents for her follow-up diabetic visit. She has type 2 diabetes mellitus. Her disease course has been improving. Hypoglycemia symptoms include nervousness/anxiousness. Pertinent negatives for diabetes include no chest pain, no fatigue, no foot paresthesias, no polydipsia, no polyuria, no visual change and no weakness. There are no hypoglycemic complications. Symptoms are stable. There are no diabetic complications. Pertinent negatives for diabetic complications include no CVA. Risk factors for coronary artery disease include dyslipidemia, diabetes mellitus, hypertension, sedentary lifestyle and stress. Current diabetic treatment includes oral agent (dual therapy). She is compliant with treatment some of the time. Her weight is stable. She is following a generally healthy diet. When asked about meal planning, she reported none. She rarely participates in exercise. Her home blood glucose trend is increasing steadily. An ACE inhibitor/angiotensin II receptor blocker is being taken. She does not see a podiatrist.Eye exam is current (she will be due this month for updated eye exam).   Hypertension   This is a chronic problem. The current episode started more than 1 year ago. The problem has been gradually improving since onset. The problem is controlled. Associated symptoms include anxiety. Pertinent negatives include no chest pain, malaise/fatigue, palpitations, peripheral edema or shortness of breath. There are no associated agents to hypertension. Risk factors for coronary artery disease include diabetes mellitus, obesity, female gender, sedentary lifestyle and post-menopausal state. Past treatments include ACE inhibitors. The current treatment provides significant improvement. Compliance problems include exercise and diet.  There is no history of CAD/MI or CVA.     Past medical, surgical, social, and family history reviewed along with  medications.     Review of Systems   Constitutional: Negative for chills, fatigue, fever and malaise/fatigue.   Eyes: Negative for visual disturbance.   Respiratory: Negative for cough, chest tightness, shortness of breath and wheezing.    Cardiovascular: Negative for chest pain, palpitations and leg swelling.   Endocrine: Negative for polydipsia and polyuria.   Musculoskeletal: Positive for arthralgias.   Neurological: Negative for weakness and numbness.   Psychiatric/Behavioral: The patient is nervous/anxious.    All other systems reviewed and are negative.      Objective:   Physical Exam  Constitutional: She is oriented to person, place, and time. She appears well-developed and well-nourished. No distress.   HENT:   Head: Normocephalic and atraumatic.   Mouth/Throat: Oropharynx is clear and moist.   Neck: Neck supple. Carotid bruit is not present.   Cardiovascular: Normal rate, regular rhythm and normal heart sounds.   Pulmonary/Chest: Effort normal and breath sounds normal.   Musculoskeletal: She exhibits no edema.   Neurological: She is alert and oriented to person, place, and time.   Skin: Skin is warm and dry. She is not diaphoretic.   Psychiatric: She has a normal mood and affect. Her behavior is normal. Judgment and thought content normal.   Nursing note and vitals reviewed.  BP 122/68   Pulse 54   Temp 98.1 ??F (36.7 ??C) (Oral)    Ht 5' 4.17" (1.63 m)   Wt 163 lb (73.9 kg)   SpO2 99%   BMI 27.83 kg/m2    Assessment:      1. Type 2 diabetes mellitus with complication, unspecified long term insulin use status (HCC)  POCT glycosylated hemoglobin (Hb A1C)   2. Pure hypercholesterolemia     3. Essential hypertension  4. Encounter for screening mammogram for breast cancer  MAM Digital Screen Bilateral   5. Anxiety             Plan:      Continue with BuSpar as needed for anxiety especially with her upcoming flight.  I did discuss with the patient and she would like another shoulder injection she can schedule  an appointment when she returns and at that time we will also give the first pneumonia vaccination.  Medication: continue current meds and refills as above.   Follow up: 6 months and as needed.   Complete ordrs as above.   Spent 10 mins answering all questions.   Patient received counseling on the following healthy behaviors: diet  Patient given educational materials per AVS  Discussed use, benefit, and side effects of prescribed medications. Barriers to medication compliance addressed. All patient questions answered. Pt voiced understanding.

## 2015-10-18 ENCOUNTER — Ambulatory Visit: Admit: 2015-10-18 | Discharge: 2015-10-18 | Payer: MEDICARE | Attending: Gastroenterology | Primary: Adult Health

## 2015-10-18 DIAGNOSIS — Z8719 Personal history of other diseases of the digestive system: Secondary | ICD-10-CM

## 2015-10-18 NOTE — Progress Notes (Signed)
Subjective:      Patient ID: Marissa Sawyer is a 65 y.o. female.    HPI   Dr. Vida Roller, MD our mutual patient Marissa Sawyer was seen  for   1. History of gastric polyp    2. Gastroesophageal reflux disease, esophagitis presence not specified    3. Diverticulosis of large intestine without hemorrhage     .    [name] Hence, 65 year old patient seen in the office for follow-up of diarrhea and GERD.    On further discussion patient has some intolerance to certain foods especially fatty foods and vegetables.  She does not have weight loss.  Her diarrhea appears to be almost resolved.  GERD symptoms are better.    In 2014 she had a colonoscopy done and was found to have diverticulosis.  Extra    This patient also known to have hyper triglyceridemia and hyperlipidemia.  Patient needs therapy for this.  After discussion, patient indicated to me that she will check with her PCP.          Past Medical, Family, and Social History reviewed and does contribute to the patient presenting condition.    patient"s PMH/PSH,SH,PSYCH hx, MEDs, ALLERGIES, and ROS was all reviewed and updated ion the appropriate sections      Review of Systems   Constitutional: Negative.  Negative for activity change, appetite change, chills, diaphoresis, fatigue, fever and unexpected weight change.   HENT: Negative.  Negative for congestion, dental problem, drooling, ear discharge, ear pain, facial swelling, hearing loss, mouth sores, nosebleeds, postnasal drip, rhinorrhea, sinus pressure, sneezing, sore throat, tinnitus, trouble swallowing and voice change.    Eyes: Positive for visual disturbance (wears glasses).   Respiratory: Negative.  Negative for cough, shortness of breath and wheezing.    Cardiovascular: Negative.  Negative for chest pain, palpitations and leg swelling.   Gastrointestinal: Positive for diarrhea (comes and goes). Negative for abdominal distention, abdominal pain, anal bleeding, blood in stool, constipation, nausea, rectal pain  and vomiting.        GERD with certain foods   Endocrine: Negative.  Negative for polydipsia, polyphagia and polyuria.   Genitourinary: Negative.  Negative for difficulty urinating, dysuria, vaginal bleeding and vaginal discharge.   Musculoskeletal: Positive for arthralgias, back pain and neck pain.   Skin: Negative.    Allergic/Immunologic: Positive for environmental allergies. Negative for food allergies and immunocompromised state.   Neurological: Negative.  Negative for dizziness, tremors, seizures, syncope, facial asymmetry, speech difficulty, weakness, light-headedness, numbness and headaches.   Hematological: Negative for adenopathy. Bruises/bleeds easily.   Psychiatric/Behavioral: Positive for sleep disturbance. The patient is nervous/anxious.        Objective:   Physical Exam   Constitutional: She is oriented to person, place, and time. She appears well-developed and well-nourished.   HENT:   Head: Normocephalic and atraumatic.   No oral lesions   Eyes: Conjunctivae are normal. Pupils are equal, round, and reactive to light. No scleral icterus.   Neck: Normal range of motion. Neck supple. No hepatojugular reflux and no JVD present. No tracheal deviation present. No thyromegaly present.   Cardiovascular: Normal rate, regular rhythm, normal heart sounds and intact distal pulses.    Pulmonary/Chest: Effort normal and breath sounds normal. No respiratory distress. She has no wheezes. She has no rales.   Abdominal: Soft. Bowel sounds are normal. She exhibits no distension, no ascites and no mass. There is no hepatomegaly. There is no tenderness. There is no rebound. No hernia.  Musculoskeletal: She exhibits no edema or tenderness.   No joint swelling   Lymphadenopathy:     She has no cervical adenopathy.   Neurological: She is alert and oriented to person, place, and time. No cranial nerve deficit.   Skin: Skin is warm. No bruising, no ecchymosis and no rash noted. No erythema.   Psychiatric: Thought content  normal.   Nursing note and vitals reviewed.      Assessment:      1. History of gastric polyp     2. Gastroesophageal reflux disease, esophagitis presence not specified     3. Diverticulosis of large intestine without hemorrhage             Plan:      Discussed with the patient regarding GERD management.  Discussed antireflux measures and to continue PPI therapy.strongly encouraged to lose some weight.    Also advised high-fiber diet, fiber supplements and precautions to avoid constipation.  Discussed regarding diverticulosis and management.

## 2015-11-12 ENCOUNTER — Ambulatory Visit: Payer: MEDICARE | Primary: Adult Health

## 2015-11-17 ENCOUNTER — Inpatient Hospital Stay: Admit: 2015-11-17 | Payer: MEDICARE | Primary: Adult Health

## 2015-11-17 DIAGNOSIS — Z1231 Encounter for screening mammogram for malignant neoplasm of breast: Secondary | ICD-10-CM

## 2015-11-24 MED ORDER — DAPAGLIFLOZIN PROPANEDIOL 5 MG PO TABS
5 | ORAL_TABLET | Freq: Every morning | ORAL | 1 refills | Status: DC
Start: 2015-11-24 — End: 2017-06-26

## 2015-11-24 NOTE — Telephone Encounter (Signed)
Health Maintenance   Topic Date Due   ??? Diabetic retinal exam  09/30/2015   ??? Pneumococcal low/med risk (1 of 2 - PCV13) 09/20/2016 (Originally 11/30/2014)   ??? Flu vaccine (1) 04/04/2017 (Originally 11/05/2015)   ??? DTaP/Tdap/Td vaccine (1 - Tdap) 12/03/2019 (Originally 11/29/1969)   ??? Diabetic foot exam  03/23/2016   ??? Diabetic hemoglobin A1C test  09/20/2016   ??? Diabetic microalbuminuria test  09/20/2016   ??? Lipid screen  09/20/2016   ??? Breast cancer screen  11/16/2017   ??? Cervical cancer screen  06/03/2019   ??? Colon cancer screen colonoscopy  04/24/2022   ??? Zostavax vaccine  Addressed   ??? DEXA (modify frequency per FRAX score)  Completed   ??? Hepatitis C screen  Addressed   ??? HIV screen  Addressed       Hemoglobin A1C (%)   Date Value   09/21/2015 6.6 (H)   09/21/2015 6.8   03/24/2015 7             ( goal A1C is < 7)   Microalb/Crt. Ratio (mcg/mg creat)   Date Value   09/21/2015 13     LDL Cholesterol (mg/dL)   Date Value   09/21/2015 135 (H)     LDL Calculated (mg/dL)   Date Value   09/29/2014 157       (goal LDL is <100)   AST (U/L)   Date Value   09/21/2015 22     ALT (U/L)   Date Value   09/21/2015 18     BUN (mg/dL)   Date Value   09/21/2015 20     BP Readings from Last 3 Encounters:   10/18/15 (!) 126/58   09/21/15 122/68   08/10/15 120/86          (goal 120/80)    All Future Testing planned in [facility]  Lab Frequency Next Occurrence   Tissue Transglutaminase, IgA Once 11/10/2015       Next Visit Date:  Future Appointments  Date Time Provider [place]   03/21/2016 1:15 PM [name], NP [facility] [place]   10/17/2016 12:45 PM Nadine Counts Grier Rocher, MD [facility] [place]            Patient Active Problem List:     Type 2 diabetes mellitus ([place])     GERD (gastroesophageal reflux disease)     Hyperlipidemia     HTN (hypertension)     OA (osteoarthritis)     Anxiety     Tubular adenoma     Dermatophytosis of nail     Benign neoplasm of skin     Type II or unspecified type diabetes mellitus with  peripheral circulatory disorders, not stated as uncontrolled     Diverticulosis of colon     Gastroesophageal reflux disease     History of gastric polyp

## 2015-12-01 ENCOUNTER — Ambulatory Visit: Admit: 2015-12-01 | Discharge: 2015-12-01 | Payer: MEDICARE | Attending: Adult Health | Primary: Adult Health

## 2015-12-01 DIAGNOSIS — I10 Essential (primary) hypertension: Secondary | ICD-10-CM

## 2015-12-01 MED ORDER — OMEPRAZOLE 40 MG PO CPDR
40 | ORAL_CAPSULE | Freq: Every day | ORAL | 5 refills | Status: DC
Start: 2015-12-01 — End: 2016-05-16

## 2015-12-01 MED ORDER — METFORMIN HCL 500 MG PO TABS
500 | ORAL_TABLET | Freq: Two times a day (BID) | ORAL | 2 refills | Status: DC
Start: 2015-12-01 — End: 2016-05-16

## 2015-12-01 MED ORDER — NAPROXEN 500 MG PO TABS
500 MG | ORAL_TABLET | Freq: Two times a day (BID) | ORAL | 3 refills | Status: DC | PRN
Start: 2015-12-01 — End: 2018-02-11

## 2015-12-01 NOTE — Progress Notes (Signed)
Pt here for a follow up on htn, states did have a headache over the weekend but was under some stress so wondering if that caused it           Visit Information    Have you changed or started any medications since your last visit including any over-the-counter medicines, vitamins, or herbal medicines? no   Have you stopped taking any of your medications? Is so, why? -  no  Are you having any side effects from any of your medications? - no    Have you seen any other physician or provider since your last visit?  yes - gastro   Have you had any other diagnostic tests since your last visit?  yes - labs   Have you been seen in the emergency room and/or had an admission in a hospital since we last saw you?  no   Have you had your routine dental cleaning in the past 6 months?  no     Do you have an active MyChart account? If no, what is the barrier?  Yes    Patient Care Team:  Kris Mouton, MD as PCP - General (Family Medicine)  Feliberto Gottron, MD as Consulting Physician (Gastroenterology)    Medical History Review  Past Medical, Family, and Social History reviewed and does not contribute to the patient presenting condition    Health Maintenance   Topic Date Due   ??? Diabetic retinal exam  09/30/2015   ??? Pneumococcal low/med risk (1 of 2 - PCV13) 09/20/2016 (Originally 11/30/2014)   ??? Flu vaccine (1) 04/04/2017 (Originally 11/05/2015)   ??? DTaP/Tdap/Td vaccine (1 - Tdap) 12/03/2019 (Originally 11/29/1969)   ??? Diabetic foot exam  03/23/2016   ??? Diabetic hemoglobin A1C test  09/20/2016   ??? Diabetic microalbuminuria test  09/20/2016   ??? Lipid screen  09/20/2016   ??? Breast cancer screen  11/16/2017   ??? Colon cancer screen colonoscopy  04/24/2022   ??? Zostavax vaccine  Addressed   ??? DEXA (modify frequency per FRAX score)  Completed   ??? Hepatitis C screen  Addressed

## 2015-12-01 NOTE — Patient Instructions (Addendum)
Anxiety Disorder: Care Instructions  Your Care Instructions  Anxiety is a normal reaction to stress. Difficult situations can cause you to have symptoms such as sweaty palms and a nervous feeling.  In an anxiety disorder, the symptoms are far more severe. Constant worry, muscle tension, trouble sleeping, nausea and diarrhea, and other symptoms can make normal daily activities difficult or impossible. These symptoms may occur for no reason, and they can affect your work, school, or social life. Medicines, counseling, and self-care can all help.  Follow-up care is a key part of your treatment and safety. Be sure to make and go to all appointments, and call your doctor if you are having problems. It's also a good idea to know your test results and keep a list of the medicines you take.  How can you care for yourself at home?  ?? Take medicines exactly as directed. Call your doctor if you think you are having a problem with your medicine.  ?? Go to your counseling sessions and follow-up appointments.  ?? Recognize and accept your anxiety. Then, when you are in a situation that makes you anxious, say to yourself, "This is not an emergency. I feel uncomfortable, but I am not in danger. I can keep going even if I feel anxious."  ?? Be kind to your body:  ?? Relieve tension with exercise or a massage.  ?? Get enough rest.  ?? Avoid alcohol, caffeine, nicotine, and illegal drugs. They can increase your anxiety level and cause sleep problems.  ?? Learn and do relaxation techniques. See below for more about these techniques.  ?? Engage your mind. Get out and do something you enjoy. Go to a funny movie, or take a walk or hike. Plan your day. Having too much or too little to do can make you anxious.  ?? Keep a record of your symptoms. Discuss your fears with a good friend or family member, or join a support group for people with similar problems. Talking to others sometimes relieves stress.  ?? Get involved in social groups, or  volunteer to help others. Being alone sometimes makes things seem worse than they are.  ?? Get at least 30 minutes of exercise on most days of the week to relieve stress. Walking is a good choice. You also may want to do other activities, such as running, swimming, cycling, or playing tennis or team sports.  Relaxation techniques  Do relaxation exercises 10 to 20 minutes a day. You can play soothing, relaxing music while you do them, if you wish.  ?? Tell others in your house that you are going to do your relaxation exercises. Ask them not to disturb you.  ?? Find a comfortable place, away from all distractions and noise.  ?? Lie down on your back, or sit with your back straight.  ?? Focus on your breathing. Make it slow and steady.  ?? Breathe in through your nose. Breathe out through either your nose or mouth.  ?? Breathe deeply, filling up the area between your navel and your rib cage. Breathe so that your belly goes up and down.  ?? Do not hold your breath.  ?? Breathe like this for 5 to 10 minutes. Notice the feeling of calmness throughout your whole body.  As you continue to breathe slowly and deeply, relax by doing the following for another 5 to 10 minutes:  ?? Tighten and relax each muscle group in your body. You can begin at your toes and work your   way up to your head.  ?? Imagine your muscle groups relaxing and becoming heavy.  ?? Empty your mind of all thoughts.  ?? Let yourself relax more and more deeply.  ?? Become aware of the state of calmness that surrounds you.  ?? When your relaxation time is over, you can bring yourself back to alertness by moving your fingers and toes and then your hands and feet and then stretching and moving your entire body. Sometimes people fall asleep during relaxation, but they usually wake up shortly afterward.  ?? Always give yourself time to return to full alertness before you drive a car or do anything that might cause an accident if you are not fully alert. Never play a relaxation  tape while you drive a car.  When should you call for help?  Call 911 anytime you think you may need emergency care. For example, call if:  ?? You feel you cannot stop from hurting yourself or someone else.  Keep the numbers for these national suicide hotlines: 1-800-273-TALK 912-180-4678) and 1-800-SUICIDE 614-487-8191). If you or someone you know talks about suicide or feeling hopeless, get help right away.  Watch closely for changes in your health, and be sure to contact your doctor if:  ?? You have anxiety or fear that affects your life.  ?? You have symptoms of anxiety that are new or different from those you had before.  Where can you learn more?  Go to [email or web address] and sign in to your MyChart account. Enter P754 in the [place] box to learn more about "Anxiety Disorder: Care Instructions."     If you do not have an account, please click on the "Sign Up Now" link.  Current as of: September 29, 2014  Content Version: 11.3  ?? 2006-2017 [employer]. Care instructions adapted under license by [facility]. If you have questions about a medical condition or this instruction, always ask your healthcare professional. [place] any warranty or liability for your use of this information.       Neck: Exercises  Your Care Instructions  Here are some examples of typical rehabilitation exercises for your condition. Start each exercise slowly. Ease off the exercise if you start to have pain.  Your doctor or physical therapist will tell you when you can start these exercises and which ones will work best for you.  How to do the exercises  Note: Stretching should make you feel a gentle stretch, but no pain. Stop any strengthening exercise that makes pain worse.  Neck stretch    1. This stretch works best if you keep your shoulder down as you lean away from it. To help you remember to do this, start by relaxing your shoulders and lightly holding on  to your thighs or your chair.  2. Tilt your head toward your shoulder and hold for 15 to 30 seconds. Let the weight of your head stretch your muscles.  3. If you would like a little added stretch, use your hand to gently and steadily pull your head toward your shoulder. For example, keeping your right shoulder down, lean your head to the left.  4. Repeat 2 to 4 times toward each shoulder.  Diagonal neck stretch    1. Turn your head slightly toward the direction you will be stretching, and tilt your head diagonally toward your chest and hold for 15 to 30 seconds.  2. If you would like a little added stretch, use your hand to gently  and steadily pull your head forward on the diagonal.  3. Repeat 2 to 4 times toward each side.  Dorsal glide stretch    1. Sit or stand tall and look straight ahead.  2. Slowly tuck your chin as you glide your head backward over your body  3. Hold for a count of 6, and then relax for up to 10 seconds.  4. Repeat 8 to 12 times.  Note: The dorsal glide stretches the back of the neck. If you feel pain, do not glide so far back. Some people find this exercise easier to do while lying on their backs with an ice pack on the neck.  Chest and shoulder stretch    1. Sit or stand tall and glide your head backward as in the dorsal glide stretch.  2. Raise both arms so that your hands are next to your ears.  3. Take a deep breath, and as you breathe out, lower your elbows down and behind your back. You will feel your shoulder blades slide down and together, and at the same time you will feel a stretch across your chest and the front of your shoulders.  4. Hold for about 6 seconds, and then relax for up to 10 seconds.  5. Repeat 8 to 12 times.  Strengthening: Hands on head    1. Move your head backward, forward, and side to side against gentle pressure from your hands, holding each position for about 6 seconds.  2. Repeat 8 to 12 times.  Follow-up care is a key part of your treatment and safety. Be  sure to make and go to all appointments, and call your doctor if you are having problems. It's also a good idea to know your test results and keep a list of the medicines you take.  Where can you learn more?  Go to [email or web address] and sign in to your MyChart account. Enter P975 in the [place] box to learn more about "Neck: Exercises."     If you do not have an account, please click on the "Sign Up Now" link.  Current as of: May 25, 2015  Content Version: 11.3  ?? 2006-2017 [employer]. Care instructions adapted under license by [facility]. If you have questions about a medical condition or this instruction, always ask your healthcare professional. [place] any warranty or liability for your use of this information.

## 2015-12-01 NOTE — Progress Notes (Signed)
Subjective:      Chief Complaint   Patient presents with   ??? Hypertension   ??? Headache       Patient ID: Marissa Sawyer is a 65 y.o. female.    Hypertension   This is a chronic problem. The current episode started more than 1 year ago. The problem has been waxing and waning since onset. The problem is controlled. Associated symptoms include anxiety, headaches (resolved) and neck pain (chronic). Pertinent negatives include no chest pain, malaise/fatigue, palpitations, peripheral edema or shortness of breath. There are no associated agents to hypertension. Risk factors for coronary artery disease include sedentary lifestyle, post-menopausal state and stress. Past treatments include ACE inhibitors. The current treatment provides significant improvement. There are no compliance problems.  There is no history of CAD/MI or CVA.   Neck Pain    This is a chronic problem. The current episode started more than 1 year ago. The pain is associated with nothing. The pain is present in the midline. The symptoms are aggravated by position. Associated symptoms include headaches (resolved), numbness (right arm) and tingling (right arm). Pertinent negatives include no chest pain, fever or weakness. She has tried nothing for the symptoms. The treatment provided no relief.     Past medical, surgical, social, and family history reviewed along with medications.     Review of Systems   Constitutional: Negative for chills, diaphoresis, fatigue, fever and malaise/fatigue.   Respiratory: Negative for chest tightness, shortness of breath and wheezing.    Cardiovascular: Negative for chest pain, palpitations and leg swelling.   Musculoskeletal: Positive for neck pain (chronic).   Neurological: Positive for tingling (right arm), numbness (right arm) and headaches (resolved). Negative for weakness.   Psychiatric/Behavioral: The patient is nervous/anxious.    All other systems reviewed and are negative.      Objective:   Physical Exam    Constitutional: She is oriented to person, place, and time. She appears well-developed and well-nourished. No distress.   HENT:   Head: Normocephalic and atraumatic.   Cardiovascular: Normal rate, regular rhythm and normal heart sounds.    Pulmonary/Chest: Effort normal and breath sounds normal.   Musculoskeletal:        Cervical back: She exhibits normal range of motion, no tenderness, no bony tenderness, no swelling, no edema, no deformity, no laceration, no pain and no spasm.   Neurological: She is alert and oriented to person, place, and time.   Skin: Skin is warm and dry. She is not diaphoretic.   Psychiatric: She has a normal mood and affect. Her behavior is normal. Judgment and thought content normal.   Nursing note and vitals reviewed.    BP 106/66   Pulse 58   Temp 97.8 ??F (36.6 ??C) (Oral)    Ht 5' 4.17" (1.63 m)   Wt 166 lb (75.3 kg)   SpO2 99%   BMI 28.34 kg/m2    Assessment:      1. Essential hypertension     2. Anxiety     3. Cervical radiculopathy  naproxen (NAPROSYN) 500 MG tablet    XR CERVICAL SPINE (2-3 VIEWS)   4. Neck pain  naproxen (NAPROSYN) 500 MG tablet    XR CERVICAL SPINE (2-3 VIEWS)           Plan:      Medication: continue current meds and refills as above.   Keep all routine follow up appointments  Complete ordrs as above.   Spent 10 mins answering all  questions.   Patient received counseling on the following healthy behaviors: decrease stress, home exercises  Naprosyn prn for pain  Xray orfered  PT offered. Patient declined  Patient given educational materials per AVS  Discussed use, benefit, and side effects of prescribed medications. Barriers to medication compliance addressed. All patient questions answered. Pt voiced understanding.

## 2016-01-31 ENCOUNTER — Ambulatory Visit
Admit: 2016-01-31 | Discharge: 2016-01-31 | Payer: MEDICARE | Attending: Advanced Practice Midwife | Primary: Adult Health

## 2016-01-31 DIAGNOSIS — Z01419 Encounter for gynecological examination (general) (routine) without abnormal findings: Secondary | ICD-10-CM

## 2016-01-31 NOTE — Patient Instructions (Signed)
Patient Education        Breast Self-Exam: Care Instructions  Your Care Instructions  A breast self-exam is when you check your breasts for lumps or changes. This regular exam helps you learn how your breasts normally look and feel. Most breast problems or changes are not because of cancer.  Breast self-exam is not a substitute for a mammogram. Having regular breast exams by your doctor and regular mammograms improve your chances of finding any problems with your breasts.  Some women set a time each month to do a step-by-step breast self-exam. Other women like a less formal system. They might look at their breasts as they brush their teeth, or feel their breasts once in a while in the shower.  If you notice a change in your breast, tell your doctor.  Follow-up care is a key part of your treatment and safety. Be sure to make and go to all appointments, and call your doctor if you are having problems. It???s also a good idea to know your test results and keep a list of the medicines you take.  How do you do a breast self-exam?  ?? The best time to examine your breasts is usually one week after your menstrual period begins. Your breasts should not be tender then. If you do not have periods, you might do your exam on a day of the month that is easy to remember.  ?? To examine your breasts:  ?? Remove all your clothes above the waist and lie down. When you are lying down, your breast tissue spreads evenly over your chest wall, which makes it easier to feel all your breast tissue.  ?? Use the pads???not the fingertips???of the 3 middle fingers of your left hand to check your right breast. Move your fingers slowly in small coin-sized circles that overlap.  ?? Use three levels of pressure to feel of all your breast tissue. Use light pressure to feel the tissue close to the skin surface. Use medium pressure to feel a little deeper. Use firm pressure to feel your tissue close to your breastbone and ribs. Use each pressure level to feel  your breast tissue before moving on to the next spot.  ?? Check your entire breast, moving up and down as if following a strip from the collarbone to the bra line, and from the armpit to the ribs. Repeat until you have covered the entire breast.  ?? Repeat this procedure for your left breast, using the pads of the 3 middle fingers of your right hand.  ?? To examine your breasts while in the shower:  ?? Place one arm over your head and lightly soap your breast on that side.  ?? Using the pads of your fingers, gently move your hand over your breast (in the strip pattern described above), feeling carefully for any lumps or changes.  ?? Repeat for the other breast.  ?? Have your doctor inspect anything you notice to see if you need further testing.  Where can you learn more?  Go to [email or web address] and sign in to your MyChart account. Enter P148 in the [facility] Information box to learn more about "Breast Self-Exam: Care Instructions."     If you do not have an account, please click on the "Sign Up Now" link.  Current as of: September 29, 2014  Content Version: 11.3  ?? 2006-2017 [employer], [employer]. Care instructions adapted under license by  Health. If you have questions about a medical condition or this   instruction, always ask your healthcare professional. [employer] disclaims any warranty or liability for your use of this information.
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
## 2016-03-21 ENCOUNTER — Ambulatory Visit: Admit: 2016-03-21 | Discharge: 2016-03-21 | Payer: MEDICARE | Attending: Adult Health | Primary: Adult Health

## 2016-03-21 DIAGNOSIS — E119 Type 2 diabetes mellitus without complications: Secondary | ICD-10-CM

## 2016-03-21 LAB — POCT GLYCOSYLATED HEMOGLOBIN (HGB A1C): Hemoglobin A1C: 6.9 %

## 2016-03-21 MED ORDER — CLOTRIMAZOLE-BETAMETHASONE 1-0.05 % EX CREA
1-0.05 | CUTANEOUS | 1 refills | Status: DC
Start: 2016-03-21 — End: 2019-09-29

## 2016-03-21 NOTE — Progress Notes (Signed)
Subjective:      Chief Complaint   Patient presents with   ??? 6 Month Follow-Up       Patient ID: Marissa Sawyer is a 66 y.o. female.    Diabetes   She presents for her follow-up diabetic visit. She has type 2 diabetes mellitus. Her disease course has been improving. Pertinent negatives for diabetes include no chest pain, no fatigue, no foot paresthesias, no polydipsia, no polyuria, no visual change and no weakness. There are no hypoglycemic complications. Symptoms are stable. There are no diabetic complications. Pertinent negatives for diabetic complications include no CVA. Risk factors for coronary artery disease include dyslipidemia, diabetes mellitus, hypertension, sedentary lifestyle and stress. Current diabetic treatment includes oral agent (dual therapy). She is compliant with treatment some of the time. Her weight is stable. She is following a generally healthy diet. When asked about meal planning, she reported none. She rarely participates in exercise. Her home blood glucose trend is increasing steadily. An ACE inhibitor/angiotensin II receptor blocker is being taken. She does not see a podiatrist.Eye exam is current (she will be due this month for updated eye exam).   Hypertension   This is a chronic problem. The current episode started more than 1 year ago. The problem has been gradually improving since onset. The problem is controlled. Associated symptoms include anxiety. Pertinent negatives include no chest pain, malaise/fatigue, palpitations, peripheral edema or shortness of breath. There are no associated agents to hypertension. Risk factors for coronary artery disease include diabetes mellitus, obesity, female gender, sedentary lifestyle and post-menopausal state. Past treatments include ACE inhibitors. The current treatment provides significant improvement. Compliance problems include exercise and diet.  There is no history of CAD/MI or CVA.   Rash   This is a recurrent problem. The current episode  started 1 to 4 weeks ago. The problem has been waxing and waning since onset. Location: right hand. The rash is characterized by itchiness, redness, dryness and scaling. She was exposed to nothing. Pertinent negatives include no cough, fatigue, fever or shortness of breath. Treatments tried: in the past lotrison has helped.     Past medical, surgical, social, and family history reviewed along with medications.     Review of Systems   Constitutional: Negative for chills, diaphoresis, fatigue, fever and malaise/fatigue.   Respiratory: Negative for cough, chest tightness, shortness of breath and wheezing.    Cardiovascular: Negative for chest pain, palpitations and leg swelling.   Endocrine: Negative for polydipsia and polyuria.   Musculoskeletal: Positive for arthralgias.   Skin: Positive for rash.   Neurological: Negative for weakness.   All other systems reviewed and are negative.      Objective:   Physical Exam    Constitutional: She is oriented to person, place, and time. She appears well-developed and well-nourished. No distress.   HENT:   Head: Normocephalic and atraumatic.   Neck: Neck supple. Carotid bruit is not present.   Cardiovascular: Normal rate, regular rhythm and normal heart sounds.    Pulmonary/Chest: Effort normal and breath sounds normal.   Musculoskeletal: She exhibits no edema.   Neurological: She is alert and oriented to person, place, and time.   Skin: Skin is warm and dry. She is not diaphoretic. Bilateral feet in good repair.  Normal sensation with microfilament testing in all points.  Pedal pulses +2.  No open lesions. Red, round scaly skin area (herald's patch) on the dosral option of right hand.   Psychiatric: She has a normal mood and  affect. Her behavior is normal. Judgment and thought content normal.   Nursing note and vitals reviewed.    BP 112/68    Temp 97.7 ??F (36.5 ??C) (Oral)    Ht 5' 4.02" (1.626 m)    Wt 167 lb (75.8 kg)    BMI 28.65 kg/m??     Assessment/Plan:      1. Pure  hypercholesterolemia  Stable. See note below  - Comprehensive Metabolic Panel; Future  - CBC; Future  - Lipid Panel; Future    2. Essential hypertension  Stable. See note below.    3. Controlled type 2 diabetes mellitus without complication, without long-term current use of insulin (HCC)  Stable. See note below  - POCT glycosylated hemoglobin (Hb A1C)  - HM DIABETES FOOT EXAM    4. Pityriasis rosea-like skin eruption  Refilled cream  - clotrimazole-betamethasone (LOTRISONE) 1-0.05 % cream; Apply topically 2 times daily.  Dispense: 45 g; Refill: 1    Medication: continue current meds and refills as above.   Follow up: 6 months and as needed.   Complete ordrs as above.   Spent 10 mins answering all questions.   Patient received counseling on the following healthy behaviors: diet  Discussed if lipids are elevated this time, we will start on liptor  Reminded to get yearly eye exam  Blood work is fasting overnight, 10-12 hours. Advised the patient they can have water in the morning but no food or sugary drinks.   Patient given educational materials per AVS  Discussed use, benefit, and side effects of prescribed medications. Barriers to medication compliance addressed. All patient questions answered. Pt voiced understanding.

## 2016-03-21 NOTE — Patient Instructions (Signed)
Patient Education        Learning About [facility] your loved one's feet and keeping them clean and soft can help prevent cracks and infection in the skin. This is especially important for people who have diabetes. Keeping toenails trimmed-and polished if that's what the person likes-also helps the person feel well-groomed.  If the person you care for has diabetes or has foot problems, such as bad bunions and corns, think about taking them to see a podiatrist. This is a doctor who specializes in the care of the feet. Sometimes a podiatrist will come to the home if the person can't go out for visits.  Try to take the person for salon pedicures if that is what they want. It's a chance to get out and see people and continue a favorite activity.  You can do basic nail care at home. Usually all you need to do is keep the nails clean and at a safe length.  How do you trim someone's toenails?  Try to trim the person's nails every week. Or check the nails each week to see if they need to be trimmed. It's easiest to trim nails after the person has had a shower or foot bath. It makes the nails softer and easier to trim.  Start by gathering your supplies. You will need toenail clippers and a nail file. You may also need nail polish and nail polish remover.  To trim the nails:  1. Wash and dry your hands. You don't need to wear gloves.  2. Use nail polish remover to take off any polish.  3. Hold the person's foot and toe steady with one hand while you trim the nail with your other hand. Trim the nails straight across. Leave the nails a little longer at the corners so that the sharp ends don't cut into the skin.  4. Keep the nails no longer than the tip of the toes.  5. Let the nails dry if they are still damp and soft.  6. Use a nail file to gently smooth the edges of the nails, especially at the corners. They may be sharp after the nails are cut straight.  7. Apply nail polish, if the person wants  it.  If the person's nails are thick and discolored, it may be safest to have a podiatrist cut them.  What else do you need to know?  When you're caring for someone's nails, it is important to remember not to trim or cut the cuticles. A minor cut in a cuticle could lead to an infection. Wash the feet daily in the shower or bath or in a basin made for washing feet. It's extra important to wash the feet carefully if the person has diabetes. After washing the feet, dry gently. Put lotion on the feet, especially on the heels. But don't put it between the toes.  If the person doesn't have diabetes and you see signs of athlete's foot (such as dry, cracking, or itchy skin between the toes), you can try an over-the-counter medicine. These medicines can kill the fungus that causes athlete's foot. If the problem doesn't go away, talk to the person's doctor. Look every day for cuts or signs of infection, such as pain, swelling, redness, or warmth. If you see any of these signs-especially in someone who has diabetes-call the doctor.  Where can you learn more?  Go to [email or web address] and sign in to your MyChart account. Enter A726 in the [facility]  Information box to learn more about "Learning About Foot and Toenail Care."     If you do not have an account, please click on the "Sign Up Now" link.  Current as of: November 28, 2014  Content Version: 11.5  ?? 2006-2017 [employer]. Care instructions adapted under license by [facility]. If you have questions about a medical condition or this instruction, always ask your healthcare professional. [employer] disclaims any warranty or liability for your use of this information.

## 2016-03-21 NOTE — Progress Notes (Signed)
Pt here for 6 month follow up on dm, htn             Visit Information    Have you changed or started any medications since your last visit including any over-the-counter medicines, vitamins, or herbal medicines? no   Have you stopped taking any of your medications? Is so, why? -  no  Are you having any side effects from any of your medications? - no    Have you seen any other physician or provider since your last visit?  no   Have you had any other diagnostic tests since your last visit?  no   Have you been seen in the emergency room and/or had an admission in a hospital since we last saw you?  no   Have you had your routine dental cleaning in the past 6 months?  no     Do you have an active MyChart account? If no, what is the barrier?  Yes    Patient Care Team:  Kris Mouton, MD as PCP - General (Family Medicine)  Feliberto Gottron, MD as Consulting Physician (Gastroenterology)    Medical History Review  Past Medical, Family, and Social History reviewed and does not contribute to the patient presenting condition    Health Maintenance   Topic Date Due   ??? Diabetic retinal exam  09/30/2015   ??? Diabetic foot exam  03/23/2016   ??? Pneumococcal low/med risk (1 of 2 - PCV13) 09/20/2016 (Originally 11/30/2014)   ??? Flu vaccine (1) 04/04/2017 (Originally 11/05/2015)   ??? DTaP/Tdap/Td vaccine (1 - Tdap) 12/03/2019 (Originally 11/29/1969)   ??? A1C test (Diabetic or Prediabetic)  09/20/2016   ??? Diabetic microalbuminuria test  09/20/2016   ??? Lipid screen  09/20/2016   ??? Potassium monitoring  09/20/2016   ??? Creatinine monitoring  09/20/2016   ??? Breast cancer screen  11/16/2017   ??? Colon cancer screen colonoscopy  04/24/2022   ??? Zostavax vaccine  Addressed   ??? DEXA (modify frequency per FRAX score)  Completed   ??? Hepatitis C screen  Addressed

## 2016-03-22 NOTE — Telephone Encounter (Signed)
PA done for Lotrisone through covermymeds

## 2016-03-25 LAB — COMPREHENSIVE METABOLIC PANEL
ALT: 21 IU/L (ref 5–59)
AST: 20 IU/L (ref 10–42)
Albumin: 4.2 g/dl (ref 3.2–5.3)
Alk Phosphatase: 78 IU/L (ref 35–121)
Anion Gap: 10
BUN: 25 mg/dl — ABNORMAL HIGH (ref 10–20)
CO2: 28 mmol/L (ref 21–32)
Calcium: 9.4 mg/dl (ref 8.7–10.8)
Chloride: 109 mmol/L (ref 95–111)
Creatinine: 0.9 mg/dl (ref 0.5–1.3)
EGFR IF NonAfrican American: 63 (ref >60)
Glucose: 141 mg/dL — ABNORMAL HIGH (ref 70–100)
Potassium: 4 mmol/L (ref 3.5–5.4)
Sodium: 143 mmol/L (ref 134–147)
Total Bilirubin: 0.4 mg/dl (ref 0.2–1.3)
Total Protein: 7.5 g/dl (ref 5.8–8.0)
eGFR African American: 76 (ref >60)

## 2016-03-25 LAB — LIPID PANEL
Chol/HDL Ratio: 5.3 — ABNORMAL HIGH (ref <5)
Cholesterol: 219 mg/dl — ABNORMAL HIGH (ref <200)
HDL: 41 mg/dl (ref 40–60)
LDL Calculated: 143 mg/dL — ABNORMAL HIGH (ref <130)
LDL/HDL Ratio: 3.5 — ABNORMAL HIGH (ref <3.5)
Triglycerides: 175 mg/dl — ABNORMAL HIGH (ref <150)
VLDL Cholesterol Calculated: 35 mg/dL — ABNORMAL HIGH (ref <30)

## 2016-03-25 LAB — CBC
Absolute Baso #: 0 10*3/uL (ref 0.0–0.1)
Absolute Eos #: 0.2 10*3/uL (ref 0.1–0.4)
Absolute Lymph #: 1.7 10*3/uL (ref 0.8–5.2)
Absolute Mono #: 0.6 10*3/uL (ref 0.1–0.9)
Absolute Neut #: 3.2 10*3/uL (ref 1.3–9.1)
Basophils %: 0.4 %
Eosinophils %: 3.4 %
Hematocrit: 42 % (ref 36.0–48.0)
Hemoglobin: 14.1 g/dL (ref 12.0–16.0)
Lymphocyte %: 29.5 %
MCH: 28.7 pg (ref 27.0–34.0)
MCHC: 33.6 g/dl (ref 31.0–36.0)
MCV: 85.4 fl
Monocytes: 10.2 %
Neutrophils %: 56.1 %
Platelets: 271 10*3/uL
RBC: 4.92 M/ul (ref 4.00–5.50)
RDW: 13.8 % (ref 10.8–14.8)
WBC: 5.7 10*3/uL (ref 3.7–10.8)

## 2016-03-26 ENCOUNTER — Telehealth

## 2016-03-26 NOTE — Telephone Encounter (Signed)
Labs look good but cholesterol is still high. I sent over Lipitor to her pharmacy.    Patient needs to watch their diet and eliminate fatty foods, deep fried foods and fast food.  They also need to watch any simple carbohydrates such as white breads, white potatoes and white pasta.  Eliminating concentrated sweets and pop is important as well.  Please drink adequate water during the day and keep any juice to a minimum.  Also overly processed foods should be kept at a minimum in the diet.

## 2016-03-27 MED ORDER — ATORVASTATIN CALCIUM 20 MG PO TABS
20 MG | ORAL_TABLET | Freq: Every day | ORAL | 5 refills | Status: DC
Start: 2016-03-27 — End: 2016-05-16

## 2016-03-27 NOTE — Telephone Encounter (Signed)
Patient was called and infromed of results. Patient voiced verbal understanding.

## 2016-03-29 ENCOUNTER — Ambulatory Visit: Admit: 2016-03-29 | Discharge: 2016-03-29 | Payer: MEDICARE | Attending: Foot & Ankle Surgery | Primary: Adult Health

## 2016-03-29 DIAGNOSIS — M7752 Other enthesopathy of left foot: Secondary | ICD-10-CM

## 2016-03-29 MED ORDER — BUPIVACAINE HCL 0.5 % IJ SOLN
0.5 % | Freq: Once | INTRAMUSCULAR | Status: AC
Start: 2016-03-29 — End: 2016-03-29
  Administered 2016-03-29: 20:00:00 5 mL via INTRADERMAL

## 2016-03-29 MED ORDER — METHYLPREDNISOLONE ACETATE 20 MG/ML IJ SUSP
20 MG/ML | Freq: Once | INTRAMUSCULAR | Status: AC
Start: 2016-03-29 — End: 2016-03-29
  Administered 2016-03-29: 20:00:00 10 mg via INTRAMUSCULAR
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
## 2016-04-13 ENCOUNTER — Encounter: Attending: Foot & Ankle Surgery | Primary: Adult Health

## 2016-04-14 NOTE — Progress Notes (Signed)
Results for the following test has been finalized and entered into the patients chart

## 2016-04-24 ENCOUNTER — Ambulatory Visit: Admit: 2016-04-24 | Discharge: 2016-04-24 | Payer: MEDICARE | Attending: Foot & Ankle Surgery | Primary: Adult Health

## 2016-04-24 ENCOUNTER — Encounter: Admit: 2016-04-24 | Discharge: 2016-04-24 | Payer: MEDICARE | Primary: Adult Health

## 2016-04-24 DIAGNOSIS — I10 Essential (primary) hypertension: Secondary | ICD-10-CM

## 2016-04-24 DIAGNOSIS — M7752 Other enthesopathy of left foot: Secondary | ICD-10-CM

## 2016-04-24 NOTE — Progress Notes (Signed)
Pt came in today for BP check, information was given to [place]. Pt informed that per Caryl Pina to increase lisinopril to 20mg  daily and f/u in the office within a month. New script sent to pharmacy-patient voiced understanding.

## 2016-04-24 NOTE — Progress Notes (Signed)
Subjective: Marissa Sawyer 66 y.o. female that presents for follow up evaluation of plantar fasciitis to the left heel.  Chief Complaint   Patient presents with   ??? Check-Up     reassess left heel, doing better   ??? Post-Op Check     right hallux nail surgery    Patient's treatment thus far has included steroid injection to the left heel, icing and stretching.  Pain is rated 0 out of 10 and is described as none. Patient has been following my prescribed course of therapy as instructed. Patient states that she is also here today for evaluation of her right big toenail. Patient denies any pain to this toe today.    Review of Systems   Constitutional: Negative for activity change, appetite change, chills, diaphoresis, fatigue and fever.   Respiratory: Negative for shortness of breath.    Cardiovascular: Negative for leg swelling.   Gastrointestinal: Negative for diarrhea and nausea.   Endocrine: Negative for cold intolerance, heat intolerance and polyuria.   Musculoskeletal: Positive for arthralgias. Negative for back pain, gait problem, joint swelling and myalgias.   Skin: Negative for color change, pallor, rash and wound.   Allergic/Immunologic: Negative for environmental allergies and food allergies.   Neurological: Negative for dizziness, weakness, light-headedness and numbness.   Hematological: Does not bruise/bleed easily.   Psychiatric/Behavioral: Negative for behavioral problems, confusion and self-injury. The patient is not nervous/anxious.        Objective: Clinical evaluation of the patient reveals no remaining ingrowth to the bilateral borders of the right hallux nail plate.  There remains incurvation noted to this nail plate due to fungal growth.  There is no pain with palpation to the bilateral borders.  There is no erythema, calor, drainage, or malodor noted.  There is no edema noted to the right hallux. There is no  pain with palpation to the plantar medial calcaneal tubercle of the left foot. There is no   pain with palpation to the plantar central aspect of the left heel. There is no pain with palpation to the porta pedis of the left foot. There is no pain with medial to lateral compression of the left calcaneus. Tinel's sign is negative to the left tarsal tunnel. There is no erythema, calor, or open lesion noted to the left foot or ankle.    Assessment:   1. Left calcaneal bursitis     2. Plantar fasciitis of left foot     3. OC (onychocryptosis)     4. Onychomycosis of toenail     5. Pain in left foot     6. Pain in toe of right foot           Plan: 1. Clinical evaluation of the patient. 2. Patient encouraged to continue with icing and stretching of the left foot.  Patient advised to return in 9 weeks for diabetic foot care.  3. Return in about 9 weeks (around 06/26/2016) for Painful Nails.   04/24/2016      Marissa Sawyer, DPM

## 2016-04-25 MED ORDER — LISINOPRIL 10 MG PO TABS
10 MG | ORAL_TABLET | ORAL | 2 refills | Status: DC
Start: 2016-04-25 — End: 2016-04-25

## 2016-04-25 MED ORDER — LISINOPRIL 20 MG PO TABS
20 | ORAL_TABLET | ORAL | 1 refills | Status: DC
Start: 2016-04-25 — End: 2016-06-30

## 2016-05-16 ENCOUNTER — Ambulatory Visit: Admit: 2016-05-16 | Discharge: 2016-05-16 | Payer: MEDICARE | Attending: Adult Health | Primary: Adult Health

## 2016-05-16 DIAGNOSIS — I10 Essential (primary) hypertension: Secondary | ICD-10-CM

## 2016-05-16 MED ORDER — METFORMIN HCL 500 MG PO TABS
500 | ORAL_TABLET | Freq: Two times a day (BID) | ORAL | 2 refills | Status: DC
Start: 2016-05-16 — End: 2016-09-26

## 2016-05-16 MED ORDER — ATORVASTATIN CALCIUM 20 MG PO TABS
20 | ORAL_TABLET | Freq: Every day | ORAL | 1 refills | Status: DC
Start: 2016-05-16 — End: 2017-01-08

## 2016-05-16 MED ORDER — OMEPRAZOLE 40 MG PO CPDR
40 | ORAL_CAPSULE | Freq: Every day | ORAL | 1 refills | Status: DC
Start: 2016-05-16 — End: 2017-05-21

## 2016-05-16 NOTE — Patient Instructions (Signed)
Patient Education        DASH Diet: Care Instructions  Your Care Instructions    The DASH diet is an eating plan that can help lower your blood pressure. DASH stands for Dietary Approaches to Stop Hypertension. Hypertension is high blood pressure.  The DASH diet focuses on eating foods that are high in calcium, potassium, and magnesium. These nutrients can lower blood pressure. The foods that are highest in these nutrients are fruits, vegetables, low-fat dairy products, nuts, seeds, and legumes. But taking calcium, potassium, and magnesium supplements instead of eating foods that are high in those nutrients does not have the same effect. The DASH diet also includes whole grains, fish, and poultry.  The DASH diet is one of several lifestyle changes your doctor may recommend to lower your high blood pressure. Your doctor may also want you to decrease the amount of sodium in your diet. Lowering sodium while following the DASH diet can lower blood pressure even further than just the DASH diet alone.  Follow-up care is a key part of your treatment and safety. Be sure to make and go to all appointments, and call your doctor if you are having problems. It's also a good idea to know your test results and keep a list of the medicines you take.  How can you care for yourself at home?  Following the DASH diet  ?? Eat 4 to 5 servings of fruit each day. A serving is 1 medium-sized piece of fruit, ?? cup chopped or canned fruit, 1/4 cup dried fruit, or 4 ounces (?? cup) of fruit juice. Choose fruit more often than fruit juice.  ?? Eat 4 to 5 servings of vegetables each day. A serving is 1 cup of lettuce or raw leafy vegetables, ?? cup of chopped or cooked vegetables, or 4 ounces (?? cup) of vegetable juice. Choose vegetables more often than vegetable juice.  ?? Get 2 to 3 servings of low-fat and fat-free dairy each day. A serving is 8 ounces of milk, 1 cup of yogurt, or 1 ?? ounces of cheese.  ?? Eat 6 to 8 servings of grains each day.  A serving is 1 slice of bread, 1 ounce of dry cereal, or ?? cup of cooked rice, pasta, or cooked cereal. Try to choose whole-grain products as much as possible.  ?? Limit lean meat, poultry, and fish to 2 servings each day. A serving is 3 ounces, about the size of a deck of cards.  ?? Eat 4 to 5 servings of nuts, seeds, and legumes (cooked dried beans, lentils, and split peas) each week. A serving is 1/3 cup of nuts, 2 tablespoons of seeds, or ?? cup of cooked beans or peas.  ?? Limit fats and oils to 2 to 3 servings each day. A serving is 1 teaspoon of vegetable oil or 2 tablespoons of salad dressing.  ?? Limit sweets and added sugars to 5 servings or less a week. A serving is 1 tablespoon jelly or jam, ?? cup sorbet, or 1 cup of lemonade.  ?? Eat less than 2,300 milligrams (mg) of sodium a day. If you limit your sodium to 1,500 mg a day, you can lower your blood pressure even more.  Tips for success  ?? Start small. Do not try to make dramatic changes to your diet all at once. You might feel that you are missing out on your favorite foods and then be more likely to not follow the plan. Make small changes, and stick   with them. Once those changes become habit, add a few more changes.  ?? Try some of the following:  ?? Make it a goal to eat a fruit or vegetable at every meal and at snacks. This will make it easy to get the recommended amount of fruits and vegetables each day.  ?? Try yogurt topped with fruit and nuts for a snack or healthy dessert.  ?? Add lettuce, tomato, cucumber, and onion to sandwiches.  ?? Combine a ready-made pizza crust with low-fat mozzarella cheese and lots of vegetable toppings. Try using tomatoes, squash, spinach, broccoli, carrots, cauliflower, and onions.  ?? Have a variety of cut-up vegetables with a low-fat dip as an appetizer instead of chips and dip.  ?? Sprinkle sunflower seeds or chopped almonds over salads. Or try adding chopped walnuts or almonds to cooked vegetables.  ?? Try some vegetarian  meals using beans and peas. Add garbanzo or kidney beans to salads. Make burritos and tacos with mashed pinto beans or black beans.  Where can you learn more?  Go to [email or web address] and sign in to your MyChart account. Enter H967 in the Search Health Information box to learn more about "DASH Diet: Care Instructions."     If you do not have an account, please click on the "Sign Up Now" link.  Current as of: November 25, 2014  Content Version: 11.5  ?? 2006-2017 [employer]. Care instructions adapted under license by [facility]. If you have questions about a medical condition or this instruction, always ask your healthcare professional. [employer] disclaims any warranty or liability for your use of this information.

## 2016-05-16 NOTE — Progress Notes (Signed)
Pt here for bp check. Pt has been taking lisinopril once in the morning and then in the evening               Visit Information    Have you changed or started any medications since your last visit including any over-the-counter medicines, vitamins, or herbal medicines? no   Have you stopped taking any of your medications? Is so, why? -  no  Are you having any side effects from any of your medications? - no    Have you seen any other physician or provider since your last visit?  yes    Have you had any other diagnostic tests since your last visit?  yes - labs   Have you been seen in the emergency room and/or had an admission in a hospital since we last saw you?  no   Have you had your routine dental cleaning in the past 6 months?  no     Do you have an active MyChart account? If no, what is the barrier?  Yes    Patient Care Team:  Kris Mouton, MD as PCP - General (Family Medicine)  Feliberto Gottron, MD as Consulting Physician (Gastroenterology)    Medical History Review  Past Medical, Family, and Social History reviewed and does not contribute to the patient presenting condition    Health Maintenance   Topic Date Due   ??? Shingles Vaccine (1 of 2 - 2 Dose Series) 11/30/1999   ??? Diabetic retinal exam  09/30/2015   ??? Pneumococcal low/med risk (1 of 2 - PCV13) 09/20/2016 (Originally 11/30/2014)   ??? Flu vaccine (1) 04/04/2017 (Originally 11/05/2015)   ??? DTaP/Tdap/Td vaccine (1 - Tdap) 12/03/2019 (Originally 11/29/1969)   ??? Diabetic microalbuminuria test  09/20/2016   ??? Diabetic foot exam  03/21/2017   ??? A1C test (Diabetic or Prediabetic)  03/21/2017   ??? Lipid screen  03/24/2017   ??? Potassium monitoring  03/24/2017   ??? Creatinine monitoring  03/24/2017   ??? Breast cancer screen  11/16/2017   ??? Colon cancer screen colonoscopy  04/24/2022   ??? DEXA (modify frequency per FRAX score)  Completed   ??? Hepatitis C screen  Addressed

## 2016-05-16 NOTE — Progress Notes (Signed)
Subjective:      Chief Complaint   Patient presents with   ??? Blood Pressure Check       Patient ID: Marissa Sawyer is a 66 y.o. female.    Hypertension   This is a chronic problem. The current episode started more than 1 year ago. The problem has been gradually improving since onset. Associated symptoms include headaches. Pertinent negatives include no blurred vision, chest pain, neck pain, peripheral edema or shortness of breath. There are no associated agents to hypertension. Risk factors for coronary artery disease include diabetes mellitus, post-menopausal state and sedentary lifestyle. Past treatments include ACE inhibitors (she was on 10mg . she has been taking a 10mg  in the evening and 10mg  in the morning because we increased her to 20mg .). The current treatment provides significant improvement. Compliance problems include diet and exercise.  There is no history of kidney disease, CAD/MI, CVA or heart failure.     Patient is concerned about the potential for brain aneurysm.  She states multiple relatives have had brain aneurysms.  She states she gets intermittent headaches and often has this feeling of cloudiness in her thoughts.  She states the problems come and go in regards to the headaches.  She does also have occasional lightheadedness.  She has struggled over the last couple of years with worsening memory problems.  She states she often has difficulty finding her words.  The thought of the possibility of her having a brain aneurysm causes great anxiety in this patient.  She states the symptoms do come frequently, about daily but resolved quickly.  This is causing ongoing stress.    Past medical, surgical, social, and family history reviewed along with medications.     Review of Systems   Constitutional: Negative for chills, diaphoresis, fatigue and fever.   Eyes: Negative for blurred vision.   Respiratory: Negative for chest tightness, shortness of breath and wheezing.    Cardiovascular: Negative for chest  pain and leg swelling.   Musculoskeletal: Negative for neck pain.   Neurological: Positive for light-headedness and headaches.        + memory difficutly   Psychiatric/Behavioral: The patient is nervous/anxious.    All other systems reviewed and are negative.      Objective:   Physical Exam   Constitutional: She is oriented to person, place, and time. She appears well-developed and well-nourished. No distress.   HENT:   Head: Normocephalic and atraumatic.   Eyes: Pupils are equal, round, and reactive to light.   Cardiovascular: Normal rate, regular rhythm and normal heart sounds.    Pulmonary/Chest: Effort normal and breath sounds normal.   Neurological: She is alert and oriented to person, place, and time.   Grossly normal   Skin: Skin is warm and dry. She is not diaphoretic.   Psychiatric: She has a normal mood and affect. Her behavior is normal. Judgment and thought content normal.   Nursing note and vitals reviewed.    BP 120/70    Temp 97.8 ??F (36.6 ??C) (Oral)    Ht 5' 4.02" (1.626 m)    Wt 165 lb (74.8 kg)    BMI 28.31 kg/m??     Assessment:      1. Essential hypertension     2. Pure hypercholesterolemia  atorvastatin (LIPITOR) 20 MG tablet   3. Memory difficulties  MRI BRAIN WO CONTRAST   4. Family history of brain aneurysm  MRI BRAIN WO CONTRAST  Plan:       Advise to make sure the insurance company approves the mri prior to getting it completed.   Continue with 20mg  of lisinopril. She can take 2 10mg  tabs of medication daily until competed then start the 20mg  daily  Medication: continue current meds and refills as above.   Follow up: Keep all routine follow up appointments  Complete ordrs as above.   Spent 10 mins answering all questions.   Patient received counseling on the following healthy behaviors: diet  Patient given educational materials per AVS  Discussed use, benefit, and side effects of prescribed medications. Barriers to medication compliance addressed. All patient questions answered. Pt  voiced understanding.

## 2016-05-25 ENCOUNTER — Inpatient Hospital Stay: Admit: 2016-05-25 | Payer: MEDICARE | Primary: Adult Health

## 2016-05-25 DIAGNOSIS — R413 Other amnesia: Secondary | ICD-10-CM

## 2016-06-26 ENCOUNTER — Encounter: Attending: Foot & Ankle Surgery | Primary: Adult Health

## 2016-06-30 ENCOUNTER — Ambulatory Visit: Admit: 2016-06-30 | Discharge: 2016-06-30 | Payer: MEDICARE | Attending: Adult Health | Primary: Adult Health

## 2016-06-30 DIAGNOSIS — J069 Acute upper respiratory infection, unspecified: Secondary | ICD-10-CM

## 2016-06-30 MED ORDER — LISINOPRIL 20 MG PO TABS
20 | ORAL_TABLET | ORAL | 1 refills | Status: DC
Start: 2016-06-30 — End: 2017-01-08

## 2016-06-30 NOTE — Progress Notes (Signed)
Subjective:      Chief Complaint   Patient presents with   . Sinusitis   . Nasal Congestion       Patient ID: Marissa Sawyer is a 66 y.o. female.    Sinusitis   The current episode started in the past 7 days. The problem has been gradually worsening since onset. There has been no fever. Associated symptoms include congestion, coughing (slight, nonproductive), ear pain, sinus pressure and sneezing. Pertinent negatives include no chills, diaphoresis, shortness of breath or sore throat. Past treatments include nothing. The treatment provided no relief.     Past medical, surgical, social, and family history reviewed along with medications.     Review of Systems   Constitutional: Negative for chills, diaphoresis, fatigue and fever.   HENT: Positive for congestion, ear pain, postnasal drip, rhinorrhea, sinus pain, sinus pressure and sneezing. Negative for sore throat.    Respiratory: Positive for cough (slight, nonproductive). Negative for shortness of breath and wheezing.    All other systems reviewed and are negative.      Objective:   Physical Exam   Constitutional: She is oriented to person, place, and time. She appears well-developed and well-nourished. No distress.   HENT:   Head: Normocephalic and atraumatic.   Right Ear: Hearing, tympanic membrane, external ear and ear canal normal. Tympanic membrane is not erythematous and not bulging.   Left Ear: Hearing, external ear and ear canal normal. Tympanic membrane is not erythematous and not bulging. A middle ear effusion is present.   Nose: No mucosal edema or rhinorrhea. Right sinus exhibits no maxillary sinus tenderness and no frontal sinus tenderness. Left sinus exhibits no maxillary sinus tenderness.   Mouth/Throat: Uvula is midline, oropharynx is clear and moist and mucous membranes are normal. No oropharyngeal exudate, posterior oropharyngeal edema or posterior oropharyngeal erythema.   Neck: Neck supple.   Cardiovascular: Normal rate, regular rhythm and normal  heart sounds.    Pulmonary/Chest: Effort normal and breath sounds normal.   Lymphadenopathy:     She has no cervical adenopathy.   Neurological: She is alert and oriented to person, place, and time.   Skin: Skin is warm and dry. She is not diaphoretic.   Psychiatric: She has a normal mood and affect. Her behavior is normal. Judgment and thought content normal.   Nursing note and vitals reviewed.    BP 120/76   Temp 98.2 F (36.8 C) (Oral)   Ht 5' 4.02" (1.626 m)   Wt 166 lb (75.3 kg)   BMI 28.48 kg/m     Assessment:       Diagnosis Orders   1. Viral URI     2. Acute seasonal allergic rhinitis, unspecified trigger             Plan:      Discussed with the patient this appears allergic and possibly viral.  Reassured that the fluid in her middle ear appears clear, she has no sinus tenderness, throat looks good, the lungs are clear.  Samples of zyrtec and dymista given  I discussed with the patient if she starts feeling worse over the weekend as having fevers, green nasal congestion or coughing up any type of green sputum to contact the office.  Educational materials given on AVS  Patient verbalized understanding of all instructions given.  RTC if symptoms fail to improve or worsen

## 2016-06-30 NOTE — Progress Notes (Signed)
Pt here for sinus pressure and nasal drainage ongoing since yesterday             Visit Information    Have you changed or started any medications since your last visit including any over-the-counter medicines, vitamins, or herbal medicines? no   Have you stopped taking any of your medications? Is so, why? -  no  Are you having any side effects from any of your medications? - no    Have you seen any other physician or provider since your last visit?  no   Have you had any other diagnostic tests since your last visit?  no   Have you been seen in the emergency room and/or had an admission in a hospital since we last saw you?  no   Have you had your routine dental cleaning in the past 6 months?  no     Do you have an active MyChart account? If no, what is the barrier?  Yes    Patient Care Team:  Kris Mouton, MD as PCP - General (Family Medicine)  [name], APRN - CNP as PCP - [facility] Attributed Provider  Feliberto Gottron, MD as Consulting Physician (Gastroenterology)    Medical History Review  Past Medical, Family, and Social History reviewed and does not contribute to the patient presenting condition    Health Maintenance   Topic Date Due   . Shingles Vaccine (1 of 2 - 2 Dose Series) 11/30/1999   . Diabetic retinal exam  09/30/2015   . Pneumococcal low/med risk (1 of 2 - PCV13) 09/20/2016 (Originally 11/30/2014)   . Flu vaccine (Season Ended) 04/04/2017 (Originally 11/04/2016)   . DTaP/Tdap/Td vaccine (1 - Tdap) 12/03/2019 (Originally [birth year]/29/1968)   . Diabetic microalbuminuria test  09/20/2016   . Diabetic foot exam  03/21/2017   . A1C test (Diabetic or Prediabetic)  03/21/2017   . Lipid screen  03/24/2017   . Potassium monitoring  03/24/2017   . Creatinine monitoring  03/24/2017   . Breast cancer screen  11/16/2017   . Colon cancer screen colonoscopy  04/24/2022   . DEXA (modify frequency per FRAX score)  Completed   . Hepatitis C screen  Addressed

## 2016-06-30 NOTE — Patient Instructions (Signed)
Patient Education        Managing Your Allergies: Care Instructions  Your Care Instructions    Managing your allergies is an important part of staying healthy. Your doctor will help you find out what may be causing the allergies. Common causes of allergy symptoms are house dust and dust mites, animal dander, mold, and pollen.  As soon as you know what triggers your symptoms, try to reduce your exposure to your triggers. This can help prevent allergy symptoms, asthma, and other health problems.  Ask your doctor about allergy medicine or immunotherapy. These treatments may help reduce or prevent allergy symptoms.  Follow-up care is a key part of your treatment and safety. Be sure to make and go to all appointments, and call your doctor if you are having problems. It's also a good idea to know your test results and keep a list of the medicines you take.  How can you care for yourself at home?   If you think that dust or dust mites are causing your allergies:   Wash sheets, pillowcases, and other bedding every week in hot water.   Use airtight, dust-proof covers for pillows, duvets, and mattresses. Avoid plastic covers, because they tend to tear quickly and do not "breathe." Wash according to the instructions.   Remove extra blankets and pillows that you don't need.   Use blankets that are machine-washable.   Don't use home humidifiers. They can help mites live longer.   Use air-conditioning. Change or clean all filters every month. Keep windows closed. Use high-efficiency air filters. Don't use window or attic fans, which draw dust into the air.   If you're allergic to pet dander, keep pets outside or, at the very least, out of your bedroom. Old carpet and cloth-covered furniture can hold a lot of animal dander. You may need to replace them.   Look for signs of cockroaches. Use cockroach baits to get rid of them. Then clean your home well.   If you're allergic to mold, don't keep indoor plants, because  molds can grow in soil. Get rid of furniture, rugs, and drapes that smell musty. Check for mold in the bathroom.   If you're allergic to pollen, stay inside when pollen counts are high.   Don't smoke or let anyone else smoke in your house. Don't use fireplaces or wood-burning stoves. Avoid paint fumes, perfumes, and other strong odors.  When should you call for help?  Give an epinephrine shot if:    You think you are having a severe allergic reaction.   After giving an epinephrine shot call 911, even if you feel better.  Call 911 if:    You have symptoms of a severe allergic reaction. These may include:   Sudden raised, red areas (hives) all over your body.   Swelling of the throat, mouth, lips, or tongue.   Trouble breathing.   Passing out (losing consciousness). Or you may feel very lightheaded or suddenly feel weak, confused, or restless.     You have been given an epinephrine shot, even if you feel better.   Call your doctor now or seek immediate medical care if:    You have symptoms of an allergic reaction, such as:   A rash or hives (raised, red areas on the skin).   Itching.   Swelling.   Belly pain, nausea, or vomiting.   Watch closely for changes in your health, and be sure to contact your doctor if:    Your   allergies get worse.     You need help controlling your allergies.     You have questions about allergy testing.     You do not get better as expected.   Where can you learn more?  Go to [email or web address] and sign in to your MyChart account. Enter L249 in the [place] box to learn more about "Managing Your Allergies: Care Instructions."     If you do not have an account, please click on the "Sign Up Now" link.  Current as of: August 14, 2015  Content Version: 11.6   2006-2018 [employer]. Care instructions adapted under license by [facility]. If you have questions about a medical condition or this instruction, always ask  your healthcare professional. [employer] any warranty or liability for your use of this information.       Patient Education        Viral Respiratory Infection: Care Instructions  Your Care Instructions    Viruses are very small organisms. They grow in number after they enter your body. There are many types that cause different illnesses, such as colds and the mumps.  The symptoms of a viral respiratory infection often start quickly. They include a fever, sore throat, and runny nose. You may also just not feel well. Or you may not want to eat much.  Most viral respiratory infections are not serious. They usually get better with time and self-care.  Antibiotics are not used to treat a viral infection. That's because antibiotics will not help cure a viral illness. In some cases, antiviral medicine can help your body fight a serious viral infection.  Follow-up care is a key part of your treatment and safety. Be sure to make and go to all appointments, and call your doctor if you are having problems. It's also a good idea to know your test results and keep a list of the medicines you take.  How can you care for yourself at home?   Rest as much as possible until you feel better.   Be safe with medicines. Take your medicine exactly as prescribed. Call your doctor if you think you are having a problem with your medicine. You will get more details on the specific medicine your doctor prescribes.   Take an over-the-counter pain medicine, such as acetaminophen (Tylenol), ibuprofen (Advil, Motrin), or naproxen (Aleve), as needed for pain and fever. Read and follow all instructions on the label. Do not give aspirin to anyone younger than 20. It has been linked to Reye syndrome, a serious illness.   Drink plenty of fluids, enough so that your urine is light yellow or clear like water. Hot fluids, such as tea or soup, may help relieve congestion in your nose and throat. If you have kidney, heart, or  liver disease and have to limit fluids, talk with your doctor before you increase the amount of fluids you drink.   Try to clear mucus from your lungs by breathing deeply and coughing.   Gargle with warm salt water once an hour. This can help reduce swelling and throat pain. Use 1 teaspoon of salt mixed in 1 cup of warm water.   Do not smoke or allow others to smoke around you. If you need help quitting, talk to your doctor about stop-smoking programs and medicines. These can increase your chances of quitting for good.  To avoid spreading the virus   Cough or sneeze into a tissue. Then throw the tissue away.  If you don't have a tissue, use your hand to cover your cough or sneeze. Then clean your hand. You can also cough into your sleeve.   Wash your hands often. Use soap and warm water. Wash for 15 to 20 seconds each time.   If you don't have soap and water near you, you can clean your hands with alcohol wipes or gel.  When should you call for help?  Call your doctor now or seek immediate medical care if:    You have a new or higher fever.     Your fever lasts more than 48 hours.     You have trouble breathing.     You have a fever with a stiff neck or a severe headache.     You are sensitive to light.     You feel very sleepy or confused.   Watch closely for changes in your health, and be sure to contact your doctor if:    You do not get better as expected.   Where can you learn more?  Go to [email or web address] and sign in to your MyChart account. Enter [phone] in the [place] box to learn more about "Viral Respiratory Infection: Care Instructions."     If you do not have an account, please click on the "Sign Up Now" link.  Current as of: February 09, 2016  Content Version: 11.6   2006-2018 Healthwise, Incorporated. Care instructions adapted under license by [facility]. If you have questions about a medical condition or this instruction, always ask your  healthcare professional. [place] any warranty or liability for your use of this information.

## 2016-09-26 ENCOUNTER — Ambulatory Visit: Admit: 2016-09-26 | Discharge: 2016-09-26 | Payer: MEDICARE | Attending: Adult Health | Primary: Adult Health

## 2016-09-26 ENCOUNTER — Inpatient Hospital Stay: Payer: MEDICARE | Primary: Adult Health

## 2016-09-26 DIAGNOSIS — E119 Type 2 diabetes mellitus without complications: Secondary | ICD-10-CM

## 2016-09-26 DIAGNOSIS — E7800 Pure hypercholesterolemia, unspecified: Secondary | ICD-10-CM

## 2016-09-26 LAB — LIPID PANEL
Chol/HDL Ratio: 4 (ref <5)
Cholesterol: 168 mg/dL (ref <200)
HDL: 42 mg/dL (ref >40)
LDL Cholesterol: 90 mg/dL (ref 0–130)
Triglycerides: 178 mg/dL — ABNORMAL HIGH (ref <150)

## 2016-09-26 LAB — COMPREHENSIVE METABOLIC PANEL
ALT: 19 U/L (ref 5–33)
AST: 25 U/L (ref <32)
Albumin/Globulin Ratio: 1.1 (ref 1.0–2.5)
Albumin: 4.2 g/dL (ref 3.5–5.2)
Alkaline Phosphatase: 83 U/L (ref 35–104)
Anion Gap: 13 mmol/L (ref 9–17)
BUN: 21 mg/dL (ref 8–23)
CO2: 25 mmol/L (ref 20–31)
Calcium: 9.5 mg/dL (ref 8.6–10.4)
Chloride: 102 mmol/L (ref 98–107)
Creatinine: 0.76 mg/dL (ref 0.50–0.90)
GFR African American: >60 mL/min (ref >60)
GFR Non-African American: >60 mL/min (ref >60)
Glucose: 134 mg/dL — ABNORMAL HIGH (ref 70–99)
Potassium: 4.4 mmol/L (ref 3.7–5.3)
Sodium: 140 mmol/L (ref 135–144)
Total Bilirubin: 0.52 mg/dL (ref 0.3–1.2)
Total Protein: 8 g/dL (ref 6.4–8.3)

## 2016-09-26 LAB — CBC
Hematocrit: 43.3 % (ref 36.3–47.1)
Hemoglobin: 13.6 g/dL (ref 11.9–15.1)
MCH: 27.9 pg (ref 25.2–33.5)
MCHC: 31.4 g/dL (ref 28.4–34.8)
MCV: 88.9 fL (ref 82.6–102.9)
MPV: 10.4 fL (ref 8.1–13.5)
NRBC Automated: 0 per 100 WBC
Platelets: 248 10*3/uL (ref 138–453)
RBC: 4.87 m/uL (ref 3.95–5.11)
RDW: 14.1 % (ref 11.8–14.4)
WBC: 6.4 10*3/uL (ref 3.5–11.3)

## 2016-09-26 LAB — POCT GLYCOSYLATED HEMOGLOBIN (HGB A1C): Hemoglobin A1C: 6.8 %

## 2016-09-26 MED ORDER — METFORMIN HCL ER 500 MG PO TB24
500 MG | ORAL_TABLET | Freq: Every day | ORAL | 5 refills | Status: DC
Start: 2016-09-26 — End: 2017-03-28

## 2016-09-26 NOTE — Progress Notes (Signed)
Subjective:      Chief Complaint   Patient presents with   ??? Follow-up     lipid, htn and dm       Patient ID: Marissa Sawyer is a 66 y.o. female.    Hypertension   This is a chronic problem. The current episode started more than 1 year ago. The problem has been gradually improving since onset. The problem is controlled. Pertinent negatives include no blurred vision, chest pain, headaches, neck pain, palpitations, peripheral edema or shortness of breath. There are no associated agents to hypertension. Risk factors for coronary artery disease include diabetes mellitus, post-menopausal state and sedentary lifestyle. Past treatments include ACE inhibitors. The current treatment provides significant improvement. Compliance problems include diet and exercise.  There is no history of kidney disease, CAD/MI, CVA or heart failure. There is no history of chronic renal disease.   Diabetes   She presents for her follow-up diabetic visit. She has type 2 diabetes mellitus. Her disease course has been stable. There are no hypoglycemic associated symptoms. Pertinent negatives for hypoglycemia include no headaches. Pertinent negatives for diabetes include no blurred vision, no chest pain, no fatigue, no foot paresthesias, no polydipsia, no polyuria and no weakness. There are no hypoglycemic complications. Symptoms are stable. Pertinent negatives for diabetic complications include no CVA, nephropathy or peripheral neuropathy. Risk factors for coronary artery disease include diabetes mellitus, dyslipidemia, hypertension, sedentary lifestyle, stress and post-menopausal. Current diabetic treatment includes oral agent (dual therapy) (hasn't been taking her metformin for a couple of weeks). She is compliant with treatment most of the time. She is following a generally healthy diet. Meal planning includes avoidance of concentrated sweets. She participates in exercise intermittently. An ACE inhibitor/angiotensin II receptor blocker is being  taken. She sees a podiatrist.Eye exam is not current.   Hyperlipidemia   This is a chronic problem. The current episode started more than 1 year ago. The problem is uncontrolled. Recent lipid tests were reviewed and are high. Exacerbating diseases include diabetes. She has no history of chronic renal disease, hypothyroidism, liver disease, obesity or nephrotic syndrome. Factors aggravating her hyperlipidemia include fatty foods. Pertinent negatives include no chest pain, focal sensory loss, focal weakness, leg pain, myalgias or shortness of breath. Current antihyperlipidemic treatment includes statins. The current treatment provides significant improvement of lipids. Compliance problems include adherence to diet and adherence to exercise.  Risk factors for coronary artery disease include hypertension, dyslipidemia, a sedentary lifestyle, post-menopausal and diabetes mellitus.     Past medical, surgical, social, and family history reviewed along with medications.     Review of Systems   Constitutional: Negative for chills, diaphoresis, fatigue, fever and unexpected weight change.   HENT: Positive for sore throat (mild).    Eyes: Negative for blurred vision and visual disturbance.   Respiratory: Negative for cough, chest tightness, shortness of breath and wheezing.    Cardiovascular: Negative for chest pain, palpitations and leg swelling.   Gastrointestinal: Negative for nausea and vomiting.   Endocrine: Negative for polydipsia and polyuria.   Musculoskeletal: Positive for arthralgias. Negative for myalgias and neck pain.   Neurological: Negative for focal weakness, weakness, numbness and headaches.   All other systems reviewed and are negative.      Objective:   Physical Exam    Constitutional: She is oriented to person, place, and time. She appears well-developed and well-nourished. No distress.   HENT:   Head: Normocephalic and atraumatic.   ENT: Oropharynx clear, moist. No erythema, swelling, hypertrophy,  exudate  Neck: Neck supple. Carotid bruit is not present.   Cardiovascular: Normal rate, regular rhythm and normal heart sounds.    Pulmonary/Chest: Effort normal and breath sounds normal.   Musculoskeletal: She exhibits no edema.   Neurological: She is alert and oriented to person, place, and time.   Skin: Skin is warm and dry. She is not diaphoretic.   Psychiatric: She has a normal mood and affect. Her behavior is normal. Judgment and thought content normal.   Nursing note and vitals reviewed.      BP 128/72    Temp 97.8 ??F (36.6 ??C) (Tympanic)    Wt 162 lb (73.5 kg)    BMI 27.79 kg/m??     Assessment:       Diagnosis Orders   1. Controlled type 2 diabetes mellitus without complication, without long-term current use of insulin (HCC)  POCT glycosylated hemoglobin (Hb A1C)    Microalbumin, Ur   2. Pure hypercholesterolemia  Comprehensive Metabolic Panel    CBC    Lipid Panel   3. Essential hypertension     4. Need for vaccination with 13-polyvalent pneumococcal conjugate vaccine             Plan:      Blood work is fasting overnight, 10-12 hours. Advised the patient they can have water in the morning but no food or sugary drinks.   Medication: continue current meds and refills as above.   Follow up: 6 months and as needed.   Complete ordrs as above.   Spent 10 mins answering all questions.  Switch bid metformin to metformin xr   Patient received counseling on the following healthy behaviors: diet, exercise  Patient needs to watch their diet and eliminate fatty foods, deep fried foods and fast food.  They also need to watch any simple carbohydrates such as white breads, white potatoes and white pasta.  Eliminating concentrated sweets and pop is important as well.  Please drink adequate water during the day and keep any juice to a minimum.  Also overly processed foods should be kept at a minimum in the diet.  Prevnar 13 ordered  Patient given educational materials per AVS  Discussed use, benefit, and side effects of  prescribed medications. Barriers to medication compliance addressed. All patient questions answered. Pt voiced understanding.

## 2016-09-26 NOTE — Progress Notes (Signed)
Pt here for a follow up on lipid, htn and dm. Pt not taking metformin           Visit Information    Have you changed or started any medications since your last visit including any over-the-counter medicines, vitamins, or herbal medicines? no   Have you stopped taking any of your medications? Is so, why? -  no  Are you having any side effects from any of your medications? - no    Have you seen any other physician or provider since your last visit?  no   Have you had any other diagnostic tests since your last visit?  yes   Have you been seen in the emergency room and/or had an admission in a hospital since we last saw you?  no   Have you had your routine dental cleaning in the past 6 months?  no     Do you have an active MyChart account? If no, what is the barrier?  Yes    Patient Care Team:  Kris Mouton, MD as PCP - General (Family Medicine)  [name], APRN - CNP as PCP - [facility] Attributed Provider  Feliberto Gottron, MD as Consulting Physician (Gastroenterology)    Medical History Review  Past Medical, Family, and Social History reviewed and does not contribute to the patient presenting condition    Health Maintenance   Topic Date Due   . Shingles Vaccine (1 of 2 - 2 Dose Series) 11/30/1999   . Pneumococcal low/med risk (1 of 2 - PCV13) 11/30/2014   . Diabetic retinal exam  09/30/2015   . Diabetic microalbuminuria test  09/20/2016   . Flu vaccine (1) 04/04/2017 (Originally 11/04/2016)   . DTaP/Tdap/Td vaccine (1 - Tdap) 12/03/2019 (Originally [birth year]1968)   . Diabetic foot exam  03/21/2017   . A1C test (Diabetic or Prediabetic)  03/21/2017   . Lipid screen  03/24/2017   . Potassium monitoring  03/24/2017   . Creatinine monitoring  03/24/2017   . Breast cancer screen  11/16/2017   . Colon cancer screen colonoscopy  04/24/2022   . DEXA (modify frequency per FRAX score)  Completed   . Hepatitis C screen  Addressed

## 2016-09-26 NOTE — Patient Instructions (Signed)
Patient Education        Learning About Diabetes Food Guidelines  Your Care Instructions    Meal planning is important to manage diabetes. It helps keep your blood sugar at a target level (which you set with your doctor). You don't have to eat special foods. You can eat what your family eats, including sweets once in a while. But you do have to pay attention to how often you eat and how much you eat of certain foods.  You may want to work with a dietitian or a certified diabetes educator (CDE) to help you plan meals and snacks. A dietitian or CDE can also help you lose weight if that is one of your goals.  What should you know about eating carbs?  Managing the amount of carbohydrate (carbs) you eat is an important part of healthy meals when you have diabetes. Carbohydrate is found in many foods.   Learn which foods have carbs. And learn the amounts of carbs in different foods.   Bread, cereal, pasta, and rice have about 15 grams of carbs in a serving. A serving is 1 slice of bread (1 ounce),  cup of cooked cereal, or 1/3 cup of cooked pasta or rice.   Fruits have 15 grams of carbs in a serving. A serving is 1 small fresh fruit, such as an apple or orange;  of a banana;  cup of cooked or canned fruit;  cup of fruit juice; 1 cup of melon or raspberries; or 2 tablespoons of dried fruit.   Milk and no-sugar-added yogurt have 15 grams of carbs in a serving. A serving is 1 cup of milk or 2/3 cup of no-sugar-added yogurt.   Starchy vegetables have 15 grams of carbs in a serving. A serving is  cup of mashed potatoes or sweet potato; 1 cup winter squash;  of a small baked potato;  cup of cooked beans; or  cup cooked corn or green peas.   Learn how much carbs to eat each day and at each meal. A dietitian or CDE can teach you how to keep track of the amount of carbs you eat. This is called carbohydrate counting.   If you are not sure how to count carbohydrate grams, use the Plate Method to plan meals. It is a  good, quick way to make sure that you have a balanced meal. It also helps you spread carbs throughout the day.   Divide your plate by types of foods. Put non-starchy vegetables on half the plate, meat or other protein food on one-quarter of the plate, and a grain or starchy vegetable in the final quarter of the plate. To this you can add a small piece of fruit and 1 cup of milk or yogurt, depending on how many carbs you are supposed to eat at a meal.   Try to eat about the same amount of carbs at each meal. Do not "save up" your daily allowance of carbs to eat at one meal.   Proteins have very little or no carbs per serving. Examples of proteins are beef, chicken, turkey, fish, eggs, tofu, cheese, cottage cheese, and peanut butter. A serving size of meat is 3 ounces, which is about the size of a deck of cards. Examples of meat substitute serving sizes (equal to 1 ounce of meat) are 1/4 cup of cottage cheese, 1 egg, 1 tablespoon of peanut butter, and  cup of tofu.  How can you eat out and still eat healthy?     Learn to estimate the serving sizes of foods that have carbohydrate. If you measure food at home, it will be easier to estimate the amount in a serving of restaurant food.   If the meal you order has too much carbohydrate (such as potatoes, corn, or baked beans), ask to have a low-carbohydrate food instead. Ask for a salad or green vegetables.   If you use insulin, check your blood sugar before and after eating out to help you plan how much to eat in the future.   If you eat more carbohydrate at a meal than you had planned, take a walk or do other exercise. This will help lower your blood sugar.  What else should you know?   Limit saturated fat, such as the fat from meat and dairy products. This is a healthy choice because people who have diabetes are at higher risk of heart disease. So choose lean cuts of meat and nonfat or low-fat dairy products. Use olive or canola oil instead of butter or shortening  when cooking.   Don't skip meals. Your blood sugar may drop too low if you skip meals and take insulin or certain medicines for diabetes.   Check with your doctor before you drink alcohol. Alcohol can cause your blood sugar to drop too low. Alcohol can also cause a bad reaction if you take certain diabetes medicines.  Follow-up care is a key part of your treatment and safety. Be sure to make and go to all appointments, and call your doctor if you are having problems. It's also a good idea to know your test results and keep a list of the medicines you take.  Where can you learn more?  Go to [email or web address] and sign in to your MyChart account. Enter I147 in the Search Health Information box to learn more about "Learning About Diabetes Food Guidelines."     If you do not have an account, please click on the "Sign Up Now" link.  Current as of: February 10, 2016  Content Version: 11.6   2006-2018 [employer]. Care instructions adapted under license by [facility]. If you have questions about a medical condition or this instruction, always ask your healthcare professional. Healthwise, Inc[employer] disclaims any warranty or liability for your use of this information.

## 2016-09-27 LAB — MICROALBUMIN, UR
Creatinine, Ur: 73.5 mg/dL (ref 28.0–217.0)
Microalb, Ur: <12 mg/L (ref <21)

## 2016-11-02 ENCOUNTER — Ambulatory Visit: Admit: 2016-11-02 | Discharge: 2016-11-02 | Payer: MEDICARE | Attending: Foot & Ankle Surgery | Primary: Adult Health

## 2016-11-02 DIAGNOSIS — M722 Plantar fascial fibromatosis: Secondary | ICD-10-CM

## 2016-11-02 MED ORDER — IBUPROFEN-FAMOTIDINE 800-26.6 MG PO TABS
ORAL_TABLET | Freq: Three times a day (TID) | ORAL | 0 refills | Status: DC
Start: 2016-11-02 — End: 2016-11-29

## 2016-11-02 MED ORDER — IBUPROFEN-FAMOTIDINE 800-26.6 MG PO TABS
ORAL_TABLET | Freq: Three times a day (TID) | ORAL | 3 refills | Status: DC
Start: 2016-11-02 — End: 2016-11-29

## 2016-11-02 NOTE — Patient Instructions (Signed)
Patient Education        Plantar Fasciitis: Exercises  Your Care Instructions  Here are some examples of typical rehabilitation exercises for your condition. Start each exercise slowly. Ease off the exercise if you start to have pain.  Your doctor or physical therapist will tell you when you can start these exercises and which ones will work best for you.  How to do the exercises  Towel stretch    1. Sit with your legs extended and knees straight.  2. Place a towel around your foot just under the toes.  3. Hold each end of the towel in each hand, with your hands above your knees.  4. Pull back with the towel so that your foot stretches toward you.  5. Hold the position for at least 15 to 30 seconds.  6. Repeat 2 to 4 times a session, up to 5 sessions a day.  Calf stretch    This exercise stretches the muscles at the back of the lower leg (the calf) and the Achilles tendon. Do this exercise 3 or 4 times a day, 5 days a week.  1. Stand facing a wall with your hands on the wall at about eye level. Put the leg you want to stretch about a step behind your other leg.  2. Keeping your back heel on the floor, bend your front knee until you feel a stretch in the back leg.  3. Hold the stretch for 15 to 30 seconds. Repeat 2 to 4 times.  Plantar fascia and calf stretch    Stretching the plantar fascia and calf muscles can increase flexibility and decrease heel pain. You can do this exercise several times each day and before and after activity.  1. Stand on a step as shown above. Be sure to hold on to the banister.  2. Slowly let your heels down over the edge of the step as you relax your calf muscles. You should feel a gentle stretch across the bottom of your foot and up the back of your leg to your knee.  3. Hold the stretch about 15 to 30 seconds, and then tighten your calf muscle a little to bring your heel back up to the level of the step. Repeat 2 to 4 times.  Towel curls    Make this exercise more challenging by  placing a weighted object, such as a soup can, on the other end of the towel.  1. While sitting, place your foot on a towel on the floor and scrunch the towel toward you with your toes.  2. Then, also using your toes, push the towel away from you.  Marble pickups    1. Put marbles on the floor next to a cup.  2. Using your toes, try to lift the marbles up from the floor and put them in the cup.  Follow-up care is a key part of your treatment and safety. Be sure to make and go to all appointments, and call your doctor if you are having problems. It's also a good idea to know your test results and keep a list of the medicines you take.  Where can you learn more?  Go to [email or web address] and sign in to your MyChart account. Enter X377 in the Search Health Information box to learn more about "Plantar Fasciitis: Exercises."     If you do not have an account, please click on the "Sign Up Now" link.  Current as of: February 02, 2016  Content Version:   11.7   2006-2018 [employer]. Care instructions adapted under license by [facility]. If you have questions about a medical condition or this instruction, always ask your healthcare professional. Health[employer], [employer] disclaims any warranty or liability for your use of this information.

## 2016-11-02 NOTE — Progress Notes (Signed)
Subjective: Marissa Sawyer 66 y.o. female that presents with chief complaint of return of pain to the left heel.  Chief Complaint   Patient presents with   . Foot Pain     Left heel pain for around 2-3 weeks or longer   . Other     no x-rays    Patient states that this has been present for about 3 weeks.  Pain is rated 3 out of 10 and is described as intermittent. Patient states that the pain is greatest after periods of rest. Patient denies any treatment for this since last visit.     Review of Systems   Constitutional: Negative for activity change, appetite change, chills, diaphoresis, fatigue and fever.   Respiratory: Negative for shortness of breath.    Cardiovascular: Negative for leg swelling.   Gastrointestinal: Negative for diarrhea and nausea.   Endocrine: Negative for cold intolerance, heat intolerance and polyuria.   Musculoskeletal: Positive for arthralgias. Negative for back pain, gait problem, joint swelling and myalgias.   Skin: Negative for color change, pallor, rash and wound.   Allergic/Immunologic: Negative for environmental allergies and food allergies.   Neurological: Negative for dizziness, weakness, light-headedness and numbness.   Hematological: Does not bruise/bleed easily.   Psychiatric/Behavioral: Negative for behavioral problems, confusion and self-injury. The patient is not nervous/anxious.        Objective: Clinical evaluation of the patient reveals pain with palpation to the plantar medial calcaneal tubercle of the left foot. There is no pain with palpation to the plantar central aspect of the left heel. There is no pain with palpation to the porta pedis of the left foot. There is no pain with medial to lateral compression of the left calcaneus. Tinel's sign is negative to the left tarsal tunnel. There is no erythema, calor, or open lesion noted to the left foot or ankle. X-ray's taken: Lateral, Lateral Oblique, and Medial Oblique of the left foot. Findings: There is no evidence of  fracture or stress fracture noted to the calcaneus.    Assessment:    Diagnosis Orders   1. Plantar fasciitis of left foot  XR FOOT LEFT (MIN 3 VIEWS)    ibuprofen-famotidine (DUEXIS) 800-26.6 MG TABS    ibuprofen-famotidine (DUEXIS) 800-26.6 MG TABS   2. Pain in left foot  XR FOOT LEFT (MIN 3 VIEWS)    ibuprofen-famotidine (DUEXIS) 800-26.6 MG TABS    ibuprofen-famotidine (DUEXIS) 800-26.6 MG TABS         Plan: 1. Clinical evaluation of the patient. 2. Patient given a prescription for Duexis.  Patient instructed on proper icing and stretching of her left foot.  3. Return in about 2 weeks (around 11/16/2016) for Evaluation of plantar fasciitis.   11/02/2016      [name], DPM

## 2016-11-13 ENCOUNTER — Inpatient Hospital Stay
Admit: 2016-11-13 | Discharge: 2016-11-14 | Disposition: A | Discharge status: Home / Self Care | Payer: PRIVATE HEALTH INSURANCE | Attending: Emergency Medicine

## 2016-11-13 DIAGNOSIS — S39012A Strain of muscle, fascia and tendon of lower back, initial encounter: Secondary | ICD-10-CM

## 2016-11-13 MED ORDER — CYCLOBENZAPRINE HCL 10 MG PO TABS
10 MG | Freq: Once | ORAL | Status: AC
Start: 2016-11-13 — End: 2016-11-13
  Administered 2016-11-14: 01:00:00 10 mg via ORAL

## 2016-11-13 MED ORDER — CYCLOBENZAPRINE HCL 10 MG PO TABS
10 MG | ORAL_TABLET | Freq: Three times a day (TID) | ORAL | 0 refills | Status: DC | PRN
Start: 2016-11-13 — End: 2016-11-29

## 2016-11-13 MED ORDER — KETOROLAC TROMETHAMINE 30 MG/ML IJ SOLN
30 MG/ML | Freq: Once | INTRAMUSCULAR | Status: AC
Start: 2016-11-13 — End: 2016-11-13
  Administered 2016-11-14: 01:00:00 30 mg via INTRAMUSCULAR
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
## 2016-11-14 MED FILL — KETOROLAC TROMETHAMINE 30 MG/ML IJ SOLN: 30 MG/ML | INTRAMUSCULAR | Qty: 1

## 2016-11-14 MED FILL — CYCLOBENZAPRINE HCL 10 MG PO TABS: 10 MG | ORAL | Qty: 1

## 2016-11-20 ENCOUNTER — Ambulatory Visit: Admit: 2016-11-20 | Discharge: 2016-11-20 | Payer: MEDICARE | Attending: Foot & Ankle Surgery | Primary: Adult Health

## 2016-11-20 DIAGNOSIS — M722 Plantar fascial fibromatosis: Secondary | ICD-10-CM

## 2016-11-20 MED ORDER — METHYLPREDNISOLONE ACETATE 20 MG/ML IJ SUSP
20 MG/ML | Freq: Once | INTRAMUSCULAR | Status: AC
Start: 2016-11-20 — End: 2016-11-20
  Administered 2016-11-20: 18:00:00 10 mg via INTRAMUSCULAR

## 2016-11-20 MED ORDER — BUPIVACAINE HCL 0.25 % IJ SOLN
0.25 % | Freq: Once | INTRAMUSCULAR | Status: AC
Start: 2016-11-20 — End: 2016-11-20
  Administered 2016-11-20: 18:00:00 12.5 mL via INTRADERMAL

## 2016-11-20 NOTE — Progress Notes (Signed)
Subjective: Marissa Sawyer 66 y.o. female that presents for follow up evaluation of plantar fasciitis left foot.  Chief Complaint   Patient presents with   . Follow-up     left foot, is still painful     Patient's treatment thus far has included Duexis, icing and stretching.  Pain is rated 6 out of 10 and is described as intermittent. Patient has not been following my prescribed course of therapy as instructed. Patient states that she has not been stretching much.    Review of Systems   Constitutional: Negative for activity change, appetite change, chills, diaphoresis, fatigue and fever.   Respiratory: Negative for shortness of breath.    Cardiovascular: Negative for leg swelling.   Gastrointestinal: Negative for diarrhea and nausea.   Endocrine: Negative for cold intolerance, heat intolerance and polyuria.   Musculoskeletal: Positive for arthralgias. Negative for back pain, gait problem, joint swelling and myalgias.   Skin: Negative for color change, pallor, rash and wound.   Allergic/Immunologic: Negative for environmental allergies and food allergies.   Neurological: Negative for dizziness, weakness, light-headedness and numbness.   Hematological: Does not bruise/bleed easily.   Psychiatric/Behavioral: Negative for behavioral problems, confusion and self-injury. The patient is not nervous/anxious.        Objective: Clinical evaluation of the patient reveals pain with palpation to the plantar medial calcaneal tubercle of the left foot. There is no pain with palpation to the plantar central aspect of the left heel. There is no pain with palpation to the porta pedis of the left foot. There is no pain with medial to lateral compression of the left calcaneus. Tinel's sign is negative to the left tarsal tunnel. There is no erythema, calor, or open lesion noted to the left foot or ankle.    Assessment:    Diagnosis Orders   1. Plantar fasciitis of left foot  bupivacaine (MARCAINE) 0.25 % injection 12.5 mg     MethylPREDNISolone Acetate SUSP 10 mg    PR INJECT TENDON SHEATH/LIGAMENT   2. Pain in left foot  bupivacaine (MARCAINE) 0.25 % injection 12.5 mg    MethylPREDNISolone Acetate SUSP 10 mg    PR INJECT TENDON SHEATH/LIGAMENT         Plan: 1. Clinical evaluation of the patient. 2. I recommended a steroid injection to the Left plantar heel. Verbal consent was obtained from the patient after all questions answered. The Left plantar heel was prepped with an alcohol swab. The injection was placed at the most tender area using a  3 cc total of a combination of 1.5 cc of 0.5% Marcaine plain and 1.5 cc of Depomedrol 20 mg/ml. A band-aid was applied, patient advised of possible local steroid reaction symptoms, and patient instructed to limit activities to this area for 24 hours.  Patient encouraged to ice and stretch her left foot consistently.  3. Return in about 2 weeks (around 12/04/2016) for Evaluation of plantar fasciitis.   11/20/2016      [name], DPM

## 2016-11-29 ENCOUNTER — Ambulatory Visit: Admit: 2016-11-29 | Discharge: 2016-11-29 | Payer: MEDICARE | Attending: Adult Health | Primary: Adult Health

## 2016-11-29 DIAGNOSIS — S39012D Strain of muscle, fascia and tendon of lower back, subsequent encounter: Secondary | ICD-10-CM

## 2016-11-29 MED ORDER — TIZANIDINE HCL 2 MG PO TABS
2 MG | ORAL_TABLET | Freq: Four times a day (QID) | ORAL | 0 refills | Status: DC | PRN
Start: 2016-11-29 — End: 2017-03-28

## 2016-11-29 NOTE — Patient Instructions (Signed)
Patient Education        Back Strain: Care Instructions  Your Care Instructions    Back strain happens when you overstretch, or pull, a muscle in your back. You may hurt your back in an accident or when you exercise or lift something.  Most back pain will get better with rest and time. You can take care of yourself at home to help your back heal.  Follow-up care is a key part of your treatment and safety. Be sure to make and go to all appointments, and call your doctor if you are having problems. It's also a good idea to know your test results and keep a list of the medicines you take.  How can you care for yourself at home?   Try to stay as active as you can, but stop or reduce any activity that causes pain.   Put ice or a cold pack on the sore muscle for 10 to 20 minutes at a time to stop swelling. Try this every 1 to 2 hours for 3 days (when you are awake) or until the swelling goes down. Put a thin cloth between the ice pack and your skin.   After 2 or 3 days, apply a heating pad on low or a warm cloth to your back. Some doctors suggest that you go back and forth between hot and cold treatments.   Take pain medicines exactly as directed.   If the doctor gave you a prescription medicine for pain, take it as prescribed.   If you are not taking a prescription pain medicine, ask your doctor if you can take an over-the-counter medicine.   Try sleeping on your side with a pillow between your legs. Or put a pillow under your knees when you lie on your back. These measures can ease pain in your lower back.   Return to your usual level of activity slowly.  When should you call for help?  Call 911 anytime you think you may need emergency care. For example, call if:    You are unable to move a leg at all.   Call your doctor now or seek immediate medical care if:    You have new or worse symptoms in your legs, belly, or buttocks. Symptoms may include:   Numbness or tingling.   Weakness.   Pain.     You  lose bladder or bowel control.   Watch closely for changes in your health, and be sure to contact your doctor if:    You have a fever, lose weight, or don't feel well.     You are not getting better as expected.   Where can you learn more?  Go to [email or web address] and sign in to your MyChart account. Enter [phone] in the [place] box to learn more about "Back Strain: Care Instructions."     If you do not have an account, please click on the "Sign Up Now" link.  Current as of: February 02, 2016  Content Version: 11.7   2006-2018 [employer]. Care instructions adapted under license by [facility]. If you have questions about a medical condition or this instruction, always ask your healthcare professional. [employer] any warranty or liability for your use of this information.       Patient Education        Low Back Pain: Exercises  Your Care Instructions  Here are some examples of typical rehabilitation exercises for your condition. Start each exercise slowly. Ease  off the exercise if you start to have pain.  Your doctor or physical therapist will tell you when you can start these exercises and which ones will work best for you.  How to do the exercises  Press-up    1. Lie on your stomach, supporting your body with your forearms.  2. Press your elbows down into the floor to raise your upper back. As you do this, relax your stomach muscles and allow your back to arch without using your back muscles. As your press up, do not let your hips or pelvis come off the floor.  3. Hold for 15 to 30 seconds, then relax.  4. Repeat 2 to 4 times.  Alternate arm and leg (bird dog) exercise    Do this exercise slowly. Try to keep your body straight at all times, and do not let one hip drop lower than the other.  1. Start on the floor, on your hands and knees.  2. Tighten your belly muscles.  3. Raise one leg off the floor, and hold it straight out behind  you. Be careful not to let your hip drop down, because that will twist your trunk.  4. Hold for about 6 seconds, then lower your leg and switch to the other leg.  5. Repeat 8 to 12 times on each leg.  6. Over time, work up to holding for 10 to 30 seconds each time.  7. If you feel stable and secure with your leg raised, try raising the opposite arm straight out in front of you at the same time.  Knee-to-chest exercise    1. Lie on your back with your knees bent and your feet flat on the floor.  2. Bring one knee to your chest, keeping the other foot flat on the floor (or keeping the other leg straight, whichever feels better on your lower back).  3. Keep your lower back pressed to the floor. Hold for at least 15 to 30 seconds.  4. Relax, and lower the knee to the starting position.  5. Repeat with the other leg. Repeat 2 to 4 times with each leg.  6. To get more stretch, put your other leg flat on the floor while pulling your knee to your chest.  Curl-ups    1. Lie on the floor on your back with your knees bent at a 90-degree angle. Your feet should be flat on the floor, about 12 inches from your buttocks.  2. Cross your arms over your chest. If this bothers your neck, try putting your hands behind your neck (not your head), with your elbows spread apart.  3. Slowly tighten your belly muscles and raise your shoulder blades off the floor.  4. Keep your head in line with your body, and do not press your chin to your chest.  5. Hold this position for 1 or 2 seconds, then slowly lower yourself back down to the floor.  6. Repeat 8 to 12 times.  Pelvic tilt exercise    1. Lie on your back with your knees bent.  2. "Brace" your stomach. This means to tighten your muscles by pulling in and imagining your belly button moving toward your spine. You should feel like your back is pressing to the floor and your hips and pelvis are rocking back.  3. Hold for about 6 seconds while you breathe smoothly.  4. Repeat 8 to 12  times.  Heel dig bridging    1. Lie on your back with  both knees bent and your ankles bent so that only your heels are digging into the floor. Your knees should be bent about 90 degrees.  2. Then push your heels into the floor, squeeze your buttocks, and lift your hips off the floor until your shoulders, hips, and knees are all in a straight line.  3. Hold for about 6 seconds as you continue to breathe normally, and then slowly lower your hips back down to the floor and rest for up to 10 seconds.  4. Do 8 to 12 repetitions.  Hamstring stretch in doorway    1. Lie on your back in a doorway, with one leg through the open door.  2. Slide your leg up the wall to straighten your knee. You should feel a gentle stretch down the back of your leg.  3. Hold the stretch for at least 15 to 30 seconds. Do not arch your back, point your toes, or bend either knee. Keep one heel touching the floor and the other heel touching the wall.  4. Repeat with your other leg.  5. Do 2 to 4 times for each leg.  Hip flexor stretch    1. Kneel on the floor with one knee bent and one leg behind you. Place your forward knee over your foot. Keep your other knee touching the floor.  2. Slowly push your hips forward until you feel a stretch in the upper thigh of your rear leg.  3. Hold the stretch for at least 15 to 30 seconds. Repeat with your other leg.  4. Do 2 to 4 times on each side.  Wall sit    1. Stand with your back 10 to 12 inches away from a wall.  2. Lean into the wall until your back is flat against it.  3. Slowly slide down until your knees are slightly bent, pressing your lower back into the wall.  4. Hold for about 6 seconds, then slide back up the wall.  5. Repeat 8 to 12 times.  Follow-up care is a key part of your treatment and safety. Be sure to make and go to all appointments, and call your doctor if you are having problems. It's also a good idea to know your test results and keep a list of the medicines you take.  Where can you  learn more?  Go to [email or web address] and sign in to your MyChart account. Enter [phone] in the [place] box to learn more about "Low Back Pain: Exercises."     If you do not have an account, please click on the "Sign Up Now" link.  Current as of: February 02, 2016  Content Version: 11.7   2006-2018 [employer], [employer]. Care instructions adapted under license by [facility]. If you have questions about a medical condition or this instruction, always ask your healthcare professional. [employer] any warranty or liability for your use of this information.
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
## 2016-12-04 ENCOUNTER — Encounter: Payer: MEDICARE | Attending: Foot & Ankle Surgery | Primary: Adult Health

## 2016-12-12 ENCOUNTER — Telehealth

## 2016-12-12 ENCOUNTER — Inpatient Hospital Stay: Admit: 2016-12-12 | Payer: MEDICARE | Primary: Adult Health

## 2016-12-12 DIAGNOSIS — Z713 Dietary counseling and surveillance: Secondary | ICD-10-CM

## 2016-12-12 MED ORDER — ACURA BLOOD GLUCOSE METER W/DEVICE KIT
PACK | 0 refills | Status: DC
Start: 2016-12-12 — End: 2022-03-10

## 2016-12-12 MED ORDER — LANCETS MISC
3 refills | Status: DC
Start: 2016-12-12 — End: 2017-03-28

## 2016-12-12 MED ORDER — GLUCOSE BLOOD VI STRP
3 refills | Status: DC
Start: 2016-12-12 — End: 2017-10-01
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
## 2016-12-12 NOTE — Addendum Note (Signed)
Addended by: Joseph Pierini on: 12/12/2016 04:47 PM     Modules accepted: Orders

## 2016-12-12 NOTE — Telephone Encounter (Signed)
Ordered. Patient can take them to [facility] on [address]. That is the closest medical supply company

## 2016-12-12 NOTE — Telephone Encounter (Signed)
Pt has Medicare parts A and B and a supplementary insurance. Could get blood glucose meter with prescription. (She currently is paying out of pocket for [employer] and supplies). Will you prescribe a meter for insurance coverage? Thank you

## 2016-12-14 ENCOUNTER — Telehealth

## 2016-12-14 MED ORDER — LANCETS MISC
1 refills | Status: DC
Start: 2016-12-14 — End: 2023-07-17

## 2016-12-14 MED ORDER — BLOOD GLUCOSE TEST VI STRP
ORAL_STRIP | 2 refills | Status: DC
Start: 2016-12-14 — End: 2020-03-29

## 2016-12-14 NOTE — Telephone Encounter (Signed)
Order done.

## 2016-12-14 NOTE — Telephone Encounter (Signed)
Patient was informed by insurance that she qualifies for a free glucose monitoring kit and was wondering if she could have one sent to the [facility] in [place]. Please advise, thank you.

## 2016-12-26 ENCOUNTER — Inpatient Hospital Stay: Payer: MEDICARE | Primary: Adult Health

## 2016-12-28 ENCOUNTER — Ambulatory Visit: Admit: 2016-12-28 | Discharge: 2016-12-28 | Payer: MEDICARE | Attending: Foot & Ankle Surgery | Primary: Adult Health

## 2016-12-28 DIAGNOSIS — M722 Plantar fascial fibromatosis: Secondary | ICD-10-CM

## 2016-12-28 NOTE — Progress Notes (Signed)
Subjective: Marissa Sawyer 66 y.o. female that presents for follow up evaluation of plantar fasciitis to the left foot.  Chief Complaint   Patient presents with   . Follow-up     left foot, doing better but still having some pain    Patient's treatment thus far has included steroid injection x 1, icing and stretching, Duexis.  Pain is rated 4 out of 10 and is described as intermittent, and less frequent. However, patient has not tried walking much. Patient has been following my prescribed course of therapy as instructed. However, patient states that she is not consistent with icing and stretching.    Review of Systems   Constitutional: Negative for activity change, appetite change, chills, diaphoresis, fatigue and fever.   Respiratory: Negative for shortness of breath.    Cardiovascular: Negative for leg swelling.   Gastrointestinal: Negative for diarrhea and nausea.   Endocrine: Negative for cold intolerance, heat intolerance and polyuria.   Musculoskeletal: Positive for arthralgias. Negative for back pain, gait problem, joint swelling and myalgias.   Skin: Negative for color change, pallor, rash and wound.   Allergic/Immunologic: Negative for environmental allergies and food allergies.   Neurological: Negative for dizziness, weakness, light-headedness and numbness.   Hematological: Does not bruise/bleed easily.   Psychiatric/Behavioral: Negative for behavioral problems, confusion and self-injury. The patient is not nervous/anxious.        Objective: Clinical evaluation of the patient reveals only slight pain with palpation to the plantar medial calcaneal tubercle of the leftfoot. There is nopain with palpation to the plantar central aspect of the leftheel. There is nopain with palpation to the porta pedis of the leftfoot. There is nopain with medial to lateral compression of the leftcalcaneus. Tinel's sign is negativeto the lefttarsal tunnel. There is noerythema, calor, or open lesion noted to the  leftfoot or ankle.    Assessment:    Diagnosis Orders   1. Plantar fasciitis of left foot     2. Pain in left foot           Plan: 1. Clinical evaluation of the patient. 2. Patient encouraged to be more consistent with icing and stretching of her left foot. Patient informed that if her pain has not improved by next visit, then I would recommend another steroid injection. 3. Return in about 2 weeks (around 01/11/2017) for Evaluation of plantar fasciitis.   12/28/2016      [name], DPM

## 2017-01-01 ENCOUNTER — Inpatient Hospital Stay: Admit: 2017-01-01 | Payer: MEDICARE | Primary: Adult Health

## 2017-01-01 DIAGNOSIS — E119 Type 2 diabetes mellitus without complications: Secondary | ICD-10-CM
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
## 2017-01-08 ENCOUNTER — Encounter

## 2017-01-08 MED ORDER — ATORVASTATIN CALCIUM 20 MG PO TABS
20 MG | ORAL_TABLET | Freq: Every day | ORAL | 5 refills | Status: DC
Start: 2017-01-08 — End: 2017-01-31

## 2017-01-08 MED ORDER — LISINOPRIL 20 MG PO TABS
20 | ORAL_TABLET | ORAL | 1 refills | Status: DC
Start: 2017-01-08 — End: 2017-03-28

## 2017-01-08 MED ORDER — ATORVASTATIN CALCIUM 20 MG PO TABS
20 | ORAL_TABLET | Freq: Every day | ORAL | 5 refills | Status: DC
Start: 2017-01-08 — End: 2017-07-30

## 2017-01-08 NOTE — Telephone Encounter (Signed)
Talked with pt and she will take the script at [facility]. Will send a new request to you for the Lipitor

## 2017-01-08 NOTE — Telephone Encounter (Signed)
[facility] to [facility]

## 2017-01-08 NOTE — Telephone Encounter (Signed)
Lipitor is $127.00 a month and the pt is unable to afford that. Is there something else you can prescribe for her?    Pt was told about good rx and she was going to do some research on the Lipitor

## 2017-01-08 NOTE — Telephone Encounter (Signed)
I would look into goodrx or it is free at [facility] if she wants to get it over there

## 2017-01-08 NOTE — Telephone Encounter (Signed)
meds will be free at [facility]

## 2017-01-11 ENCOUNTER — Encounter: Payer: MEDICARE | Attending: Foot & Ankle Surgery | Primary: Adult Health

## 2017-01-23 ENCOUNTER — Inpatient Hospital Stay: Admit: 2017-01-23 | Payer: MEDICARE | Primary: Adult Health

## 2017-01-23 DIAGNOSIS — E119 Type 2 diabetes mellitus without complications: Secondary | ICD-10-CM
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
## 2017-01-29 NOTE — Discharge Instructions (Signed)
Diabetic Report Card for Marissa Sawyer  Hemoglobin A1C:   Your Hemoglobin A1C values should be less than 7. If these are greater than 7, you have a higher chance of having eye, heart, and kidney problems in the future.  Your most recent Hemoglobin A1C values were:   Lab Results   Component Value Date    LABA1C 6.8 09/26/2016    LABA1C 6.9 03/21/2016    LABA1C 6.6 (H) 09/21/2015       Blood Pressure:   Your blood pressure values should be less than 140/80.   Your most recent blood pressure readings were:   BP Readings from Last 3 Encounters:   12/12/16 (!) 112/53   11/29/16 126/64   11/20/16 138/74       Cholesterol:   Your LDL Cholesterol (bad cholesterol) values should be less than 100.    Lab Results   Component Value Date    LDLCALC 143 (H) 03/24/2016    LDLCHOLESTEROL 90 09/26/2016       Urine Protein:  Your urine protein values should be less than  0- 19. If they are consistently above this value, your chance of kidney problems increases yearly.    Lab Results   Component Value Date    LABMICR CANNOT BE CALCULATED 09/26/2016       Weight History:   Maintaining a healthy weight helps control your diabetes. Talking with your health care provider to achieve and maintain a healthy weight. IF you are having difficulty achieving your weight goals, [facility] may be able to help, call [phone] for more information.  Wt Readings from Last 3 Encounters:   01/23/17 156 lb 1.4 oz (70.8 kg)   12/28/16 158 lb (71.7 kg)   12/12/16 157 lb 13.6 oz (71.6 kg)

## 2017-01-31 ENCOUNTER — Ambulatory Visit
Admit: 2017-01-31 | Discharge: 2017-01-31 | Payer: MEDICARE | Attending: Advanced Practice Midwife | Primary: Adult Health

## 2017-01-31 DIAGNOSIS — Z01419 Encounter for gynecological examination (general) (routine) without abnormal findings: Secondary | ICD-10-CM

## 2017-01-31 NOTE — Patient Instructions (Signed)
Patient Education        Breast Self-Exam: Care Instructions  Your Care Instructions    A breast self-exam is when you check your breasts for lumps or changes. This regular exam helps you learn how your breasts normally look and feel. Most breast problems or changes are not because of cancer.  Breast self-exam is not a substitute for a mammogram. Having regular breast exams by your doctor and regular mammograms improve your chances of finding any problems with your breasts.  Some women set a time each month to do a step-by-step breast self-exam. Other women like a less formal system. They might look at their breasts as they brush their teeth, or feel their breasts once in a while in the shower.  If you notice a change in your breast, tell your doctor.  Follow-up care is a key part of your treatment and safety. Be sure to make and go to all appointments, and call your doctor if you are having problems. It's also a good idea to know your test results and keep a list of the medicines you take.  How do you do a breast self-exam?   The best time to examine your breasts is usually one week after your menstrual period begins. Your breasts should not be tender then. If you do not have periods, you might do your exam on a day of the month that is easy to remember.   To examine your breasts:  ? Remove all your clothes above the waist and lie down. When you are lying down, your breast tissue spreads evenly over your chest wall, which makes it easier to feel all your breast tissue.  ? Use the pads--not the fingertips--of the 3 middle fingers of your left hand to check your right breast. Move your fingers slowly in small coin-sized circles that overlap.  ? Use three levels of pressure to feel of all your breast tissue. Use light pressure to feel the tissue close to the skin surface. Use medium pressure to feel a little deeper. Use firm pressure to feel your tissue close to your breastbone and ribs. Use each pressure level to  feel your breast tissue before moving on to the next spot.  ? Check your entire breast, moving up and down as if following a strip from the collarbone to the bra line, and from the armpit to the ribs. Repeat until you have covered the entire breast.  ? Repeat this procedure for your left breast, using the pads of the 3 middle fingers of your right hand.   To examine your breasts while in the shower:  ? Place one arm over your head and lightly soap your breast on that side.  ? Using the pads of your fingers, gently move your hand over your breast (in the strip pattern described above), feeling carefully for any lumps or changes.  ? Repeat for the other breast.   Have your doctor inspect anything you notice to see if you need further testing.  Where can you learn more?  Go to [email or web address] and sign in to your MyChart account. Enter P148 in the Search Health Information box to learn more about "Breast Self-Exam: Care Instructions."     If you do not have an account, please click on the "Sign Up Now" link.  Current as of: May 31, 2016  Content Version: 11.8   2006-2018 [employer]. Care instructions adapted under license by [facility]. If you have questions about a medical condition   or this instruction, always ask your healthcare professional. [employer] disclaims any warranty or liability for your use of this information.
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
## 2017-02-02 ENCOUNTER — Encounter

## 2017-02-02 MED ORDER — BUSPIRONE HCL 10 MG PO TABS
10 | ORAL_TABLET | Freq: Three times a day (TID) | ORAL | 1 refills | Status: DC | PRN
Start: 2017-02-02 — End: 2019-09-29

## 2017-02-02 NOTE — Telephone Encounter (Signed)
Patient last seen 11/29/2016. Please refill or deny thank you.

## 2017-02-07 ENCOUNTER — Inpatient Hospital Stay: Admit: 2017-02-07 | Payer: MEDICARE | Primary: Adult Health

## 2017-02-07 DIAGNOSIS — E119 Type 2 diabetes mellitus without complications: Secondary | ICD-10-CM
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
## 2017-02-15 ENCOUNTER — Inpatient Hospital Stay: Admit: 2017-02-15 | Payer: MEDICARE | Primary: Adult Health

## 2017-02-15 DIAGNOSIS — E119 Type 2 diabetes mellitus without complications: Secondary | ICD-10-CM
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
## 2017-02-19 ENCOUNTER — Inpatient Hospital Stay: Payer: MEDICARE | Primary: Adult Health

## 2017-02-19 ENCOUNTER — Encounter

## 2017-02-19 ENCOUNTER — Inpatient Hospital Stay: Admit: 2017-02-19 | Payer: MEDICARE | Primary: Adult Health

## 2017-02-19 DIAGNOSIS — Z01419 Encounter for gynecological examination (general) (routine) without abnormal findings: Secondary | ICD-10-CM

## 2017-02-20 ENCOUNTER — Encounter: Attending: Adult Health | Primary: Adult Health

## 2017-02-23 ENCOUNTER — Inpatient Hospital Stay: Admit: 2017-02-23 | Payer: MEDICARE | Primary: Adult Health

## 2017-02-23 DIAGNOSIS — Z01419 Encounter for gynecological examination (general) (routine) without abnormal findings: Secondary | ICD-10-CM

## 2017-03-28 ENCOUNTER — Ambulatory Visit: Admit: 2017-03-28 | Discharge: 2017-03-28 | Payer: MEDICARE | Attending: Adult Health | Primary: Adult Health

## 2017-03-28 ENCOUNTER — Inpatient Hospital Stay: Payer: MEDICARE | Primary: Adult Health

## 2017-03-28 DIAGNOSIS — E119 Type 2 diabetes mellitus without complications: Secondary | ICD-10-CM

## 2017-03-28 LAB — COMPREHENSIVE METABOLIC PANEL
ALT: 19 U/L (ref 5–33)
AST: 22 U/L (ref <32)
Albumin/Globulin Ratio: 1.2 (ref 1.0–2.5)
Albumin: 4.5 g/dL (ref 3.5–5.2)
Alkaline Phosphatase: 76 U/L (ref 35–104)
Anion Gap: 15 mmol/L (ref 9–17)
BUN: 21 mg/dL (ref 8–23)
CO2: 23 mmol/L (ref 20–31)
Calcium: 9.7 mg/dL (ref 8.6–10.4)
Chloride: 102 mmol/L (ref 98–107)
Creatinine: 0.8 mg/dL (ref 0.50–0.90)
GFR African American: >60 mL/min (ref >60)
GFR Non-African American: >60 mL/min (ref >60)
Glucose: 129 mg/dL — ABNORMAL HIGH (ref 70–99)
Potassium: 4.5 mmol/L (ref 3.7–5.3)
Sodium: 140 mmol/L (ref 135–144)
Total Bilirubin: 0.58 mg/dL (ref 0.3–1.2)
Total Protein: 8.2 g/dL (ref 6.4–8.3)

## 2017-03-28 LAB — CBC WITH AUTO DIFFERENTIAL
Absolute Eos #: 0.13 10*3/uL (ref 0.00–0.44)
Absolute Immature Granulocyte: <0.03 10*3/uL (ref 0.00–0.30)
Absolute Lymph #: 1.8 10*3/uL (ref 1.10–3.70)
Absolute Mono #: 0.61 10*3/uL (ref 0.10–1.20)
Basophils Absolute: 0.03 10*3/uL (ref 0.00–0.20)
Basophils: 1 % (ref 0–2)
Eosinophils %: 2 % (ref 1–4)
Hematocrit: 45.6 % (ref 36.3–47.1)
Hemoglobin: 14.5 g/dL (ref 11.9–15.1)
Immature Granulocytes: 0 %
Lymphocytes: 30 % (ref 24–43)
MCH: 28.5 pg (ref 25.2–33.5)
MCHC: 31.8 g/dL (ref 28.4–34.8)
MCV: 89.6 fL (ref 82.6–102.9)
MPV: 10.6 fL (ref 8.1–13.5)
Monocytes: 10 % (ref 3–12)
NRBC Automated: 0 per 100 WBC
Platelets: 259 10*3/uL (ref 138–453)
RBC: 5.09 m/uL (ref 3.95–5.11)
RDW: 13 % (ref 11.8–14.4)
Seg Neutrophils: 57 % (ref 36–65)
Segs Absolute: 3.39 10*3/uL (ref 1.50–8.10)
WBC: 6 10*3/uL (ref 3.5–11.3)

## 2017-03-28 LAB — LIPID PANEL
Chol/HDL Ratio: 3.5 (ref <5)
Cholesterol: 149 mg/dL (ref <200)
HDL: 43 mg/dL (ref >40)
LDL Cholesterol: 81 mg/dL (ref 0–130)
Triglycerides: 123 mg/dL (ref <150)

## 2017-03-28 LAB — POCT GLYCOSYLATED HEMOGLOBIN (HGB A1C): Hemoglobin A1C: 6.3 %

## 2017-03-28 MED ORDER — LISINOPRIL 10 MG PO TABS
10 | ORAL_TABLET | ORAL | 1 refills | Status: DC
Start: 2017-03-28 — End: 2017-06-26

## 2017-03-28 MED ORDER — METFORMIN HCL ER 500 MG PO TB24
500 | ORAL_TABLET | Freq: Every day | ORAL | 1 refills | Status: DC
Start: 2017-03-28 — End: 2017-06-26

## 2017-03-28 NOTE — Progress Notes (Signed)
Subjective:      Chief Complaint   Patient presents with   . 6 Month Follow-Up       Patient ID: NANCEE BROWNRIGG is a 67 y.o. female.    Hypertension   This is a chronic problem. The current episode started more than 1 year ago. The problem has been gradually improving since onset. The problem is controlled. Pertinent negatives include no blurred vision, chest pain, headaches, neck pain, palpitations, peripheral edema or shortness of breath. There are no associated agents to hypertension. Risk factors for coronary artery disease include diabetes mellitus, post-menopausal state and sedentary lifestyle. Past treatments include ACE inhibitors (patietn would like to go back down to the 10mg ). The current treatment provides significant improvement. Compliance problems include diet and exercise.  There is no history of kidney disease, CAD/MI, CVA or heart failure. There is no history of chronic renal disease.   Diabetes   She presents for her follow-up diabetic visit. She has type 2 diabetes mellitus. Her disease course has been improving. There are no hypoglycemic associated symptoms. Pertinent negatives for hypoglycemia include no dizziness or headaches. Pertinent negatives for diabetes include no blurred vision, no chest pain, no fatigue, no foot paresthesias, no polydipsia, no polyuria and no weakness. There are no hypoglycemic complications. Symptoms are stable. Pertinent negatives for diabetic complications include no CVA, nephropathy or peripheral neuropathy. Risk factors for coronary artery disease include diabetes mellitus, dyslipidemia, hypertension, sedentary lifestyle, stress and post-menopausal. Current diabetic treatment includes oral agent (dual therapy) and diet. She is compliant with treatment all of the time. Her weight is decreasing steadily. She is following a generally healthy diet. Meal planning includes avoidance of concentrated sweets. She has had a previous visit with a dietitian. She participates in  exercise intermittently. An ACE inhibitor/angiotensin II receptor blocker is being taken. She sees a podiatrist (see podiatry this week).Eye exam is not current.   Hyperlipidemia   This is a chronic problem. The current episode started more than 1 year ago. The problem is controlled. Recent lipid tests were reviewed and are normal. Exacerbating diseases include diabetes. She has no history of chronic renal disease, hypothyroidism, liver disease, obesity or nephrotic syndrome. Factors aggravating her hyperlipidemia include fatty foods. Pertinent negatives include no chest pain, focal sensory loss, focal weakness, leg pain, myalgias or shortness of breath. Current antihyperlipidemic treatment includes statins. The current treatment provides significant improvement of lipids. Compliance problems include adherence to diet and adherence to exercise.  Risk factors for coronary artery disease include hypertension, dyslipidemia, a sedentary lifestyle, post-menopausal and diabetes mellitus.     Past medical, surgical, social, and family history reviewed along with medications.     Review of Systems   Constitutional: Negative for chills, diaphoresis, fatigue and fever.   Eyes: Negative for blurred vision and visual disturbance.   Respiratory: Negative for chest tightness, shortness of breath and wheezing.    Cardiovascular: Negative for chest pain, palpitations and leg swelling.   Endocrine: Negative for polydipsia and polyuria.   Musculoskeletal: Negative for myalgias and neck pain.   Neurological: Negative for dizziness, focal weakness, weakness, light-headedness, numbness and headaches.   All other systems reviewed and are negative.      Objective:   Physical Exam   Constitutional: She is oriented to person, place, and time. She appears well-developed and well-nourished. No distress.   HENT:   Head: Normocephalic and atraumatic.   Neck: Neck supple.   Cardiovascular: Normal rate, regular rhythm and normal heart sounds.  Exam reveals no friction rub.    No murmur heard.  Pulmonary/Chest: Effort normal and breath sounds normal. She has no wheezes.   Musculoskeletal: She exhibits no edema (in bilateral lower legs).   Neurological: She is alert and oriented to person, place, and time.   Skin: Skin is warm and dry. She is not diaphoretic.   Psychiatric: She has a normal mood and affect. Her behavior is normal. Judgment and thought content normal.         BP 128/68   Pulse 58   Temp 97.7 F (36.5 C) (Tympanic)   Ht 5' 4.02" (1.626 m)   Wt 157 lb (71.2 kg)   SpO2 98%   BMI 26.94 kg/m     Assessment:       Diagnosis Orders   1. Controlled type 2 diabetes mellitus without complication, without long-term current use of insulin (HCC)  POCT glycosylated hemoglobin (Hb A1C)    lisinopril (PRINIVIL;ZESTRIL) 10 MG tablet    metFORMIN (GLUCOPHAGE XR) 500 MG extended release tablet    [place], MD*    Comprehensive Metabolic Panel    CBC With Auto Differential    Lipid Panel   2. Actinic keratosis     3. Pure hypercholesterolemia  Comprehensive Metabolic Panel    CBC With Auto Differential    Lipid Panel   4. Essential hypertension  lisinopril (PRINIVIL;ZESTRIL) 10 MG tablet           Plan:      Blood work is fasting overnight, 10-12 hours. Advised the patient they can have water in the morning but no food or sugary drinks.   Continue with podiatry  Reduce dose of lisinopril, BP is stable  Medication: continue current meds and refills as above.   Follow up: 6 months and as needed.   Complete ordrs as above.   Spent 10 mins answering all questions.   Patient received counseling on the following healthy behaviors: continue with diet changes and weight loss  Patient given educational materials per AVS  Discussed use, benefit, and side effects of prescribed medications. Barriers to medication compliance addressed. All patient questions answered. Pt voiced understanding.             [name], APRN  - CNP

## 2017-03-28 NOTE — Progress Notes (Signed)
Pt here for a 6 month follow up on lipid, dm and htn. Pt has a spot on her back that has been there 3 weeks           Visit Information    Have you changed or started any medications since your last visit including any over-the-counter medicines, vitamins, or herbal medicines? no   Have you stopped taking any of your medications? Is so, why? -  no  Are you having any side effects from any of your medications? - no    Have you seen any other physician or provider since your last visit?  no   Have you had any other diagnostic tests since your last visit?  no   Have you been seen in the emergency room and/or had an admission in a hospital since we last saw you?  no   Have you had your routine dental cleaning in the past 6 months?  no     Do you have an active MyChart account? If no, what is the barrier?  Yes    Patient Care Team:  Kris Mouton, MD as PCP - General (Family Medicine)  Feliberto Gottron, MD as Consulting Physician (Gastroenterology)    Medical History Review  Past Medical, Family, and Social History reviewed and does not contribute to the patient presenting condition    Health Maintenance   Topic Date Due   . Shingles Vaccine (1 of 2 - 2 Dose Series) 11/30/1999   . Diabetic retinal exam  09/30/2015   . Diabetic foot exam  03/21/2017   . Flu vaccine (1) 04/04/2017 (Originally 11/04/2016)   . DTaP/Tdap/Td vaccine (1 - Tdap) 12/03/2019 (Originally 11/29/1969)   . A1C test (Diabetic or Prediabetic)  09/26/2017   . Diabetic microalbuminuria test  09/26/2017   . Lipid screen  09/26/2017   . Pneumococcal low/med risk (2 of 2 - PPSV23) 09/26/2017   . Potassium monitoring  09/26/2017   . Creatinine monitoring  09/26/2017   . Breast cancer screen  02/20/2019   . Colon cancer screen colonoscopy  04/24/2022   . DEXA (modify frequency per FRAX score)  Completed   . Hepatitis C screen  Addressed

## 2017-04-03 ENCOUNTER — Ambulatory Visit: Admit: 2017-04-03 | Discharge: 2017-04-03 | Payer: MEDICARE | Attending: Foot & Ankle Surgery | Primary: Adult Health

## 2017-04-03 DIAGNOSIS — M722 Plantar fascial fibromatosis: Secondary | ICD-10-CM

## 2017-04-03 MED ORDER — BUPIVACAINE HCL 0.25 % IJ SOLN
0.25 % | Freq: Once | INTRAMUSCULAR | Status: AC
Start: 2017-04-03 — End: 2017-04-03
  Administered 2017-04-03: 16:00:00 12.5 mL via INTRADERMAL

## 2017-04-03 MED ORDER — METHYLPREDNISOLONE ACETATE 20 MG/ML IJ SUSP
20 MG/ML | Freq: Once | INTRAMUSCULAR | Status: AC
Start: 2017-04-03 — End: 2017-04-03
  Administered 2017-04-03: 16:00:00 10 mg via INTRAMUSCULAR

## 2017-04-03 NOTE — Progress Notes (Signed)
[facility]  Return Patient Progress Note    Subjective: Marissa Sawyer 67 y.o. female that presents for follow up evaluation of plantar fasciitis to the left foot.  Chief Complaint   Patient presents with   . Foot Pain     left foot, doing better     Patient's treatment thus far has included steroid injection x 1, icing and stretching, Duexis.  Pain is rated 2 out of 10 and is described as intermittent. Patient states that the heel discomfort is more annoying than painful. Patient has been following my prescribed course of therapy as instructed.     Review of Systems   Constitutional: Negative for activity change, appetite change, chills, diaphoresis, fatigue and fever.   Respiratory: Negative for shortness of breath.    Cardiovascular: Negative for leg swelling.   Gastrointestinal: Negative for diarrhea and nausea.   Endocrine: Negative for cold intolerance, heat intolerance and polyuria.   Musculoskeletal: Positive for arthralgias. Negative for back pain, gait problem, joint swelling and myalgias.   Skin: Negative for color change, pallor, rash and wound.   Allergic/Immunologic: Negative for environmental allergies and food allergies.   Neurological: Negative for dizziness, weakness, light-headedness and numbness.   Hematological: Does not bruise/bleed easily.   Psychiatric/Behavioral: Negative for behavioral problems, confusion and self-injury. The patient is not nervous/anxious.        Objective: Clinical evaluation of the patient reveals only slight continued pain with palpation to the plantar medial calcaneal tubercle of the leftfoot. There is nopain with palpation to the plantar central aspect of the leftheel. There is nopain with palpation to the porta pedis of the leftfoot. There is nopain with medial to lateral compression of the leftcalcaneus. Tinel's sign is negativeto the lefttarsal tunnel. There is noerythema, calor, or open lesion noted to the leftfoot or ankle.    Assessment:     Diagnosis Orders   1. Plantar fasciitis of left foot  bupivacaine (MARCAINE) 0.25 % injection 12.5 mg    MethylPREDNISolone Acetate SUSP 10 mg    PR INJECT TENDON SHEATH/LIGAMENT   2. Pain in left foot  bupivacaine (MARCAINE) 0.25 % injection 12.5 mg    MethylPREDNISolone Acetate SUSP 10 mg    PR INJECT TENDON SHEATH/LIGAMENT         Plan: 1. Clinical evaluation of the patient. 2. Since the patient is still having pain I recommended a steroid injection to the Left plantar heel. Verbal consent was obtained from the patient after all questions answered. The Left plantar heel was prepped with an alcohol swab. The injection was placed at the most tender area using a  3 cc total of a combination of 1.5 cc of 0.5% Marcaine plain and 1.5 cc of Depomedrol 20 mg/ml. A band-aid was applied, patient advised of possible local steroid reaction symptoms, and patient instructed to limit activities to this area for 24 hours. Patient to continue with icing and stretching of the left foot. 3. Return in about 4 weeks (around 05/01/2017) for Evaluation of plantar fasciitis.   04/03/2017      [name], DPM

## 2017-05-01 ENCOUNTER — Encounter: Attending: Foot & Ankle Surgery | Primary: Adult Health

## 2017-05-03 NOTE — Telephone Encounter (Signed)
Medication: Wilder Glade 5mg     Quantity: 2    Directions: 1 tab daily    Lot Number: [id number]    Expiration: 10/2019    Prescriber: Joseph Pierini, CNP

## 2017-05-19 ENCOUNTER — Inpatient Hospital Stay: Payer: MEDICARE | Primary: Adult Health

## 2017-05-21 MED ORDER — OMEPRAZOLE 40 MG PO CPDR
40 MG | ORAL_CAPSULE | Freq: Every day | ORAL | 1 refills | Status: DC
Start: 2017-05-21 — End: 2017-06-26

## 2017-05-21 NOTE — Telephone Encounter (Signed)
Medication: Farxiga 5 mg    Quantity: 3    Directions: Take 1 tablet by mouth every morning    Lot Number: [id number]    Expiration: 10/21    Prescriber: Augustin Coupe CNP

## 2017-06-08 DIAGNOSIS — E119 Type 2 diabetes mellitus without complications: Secondary | ICD-10-CM

## 2017-06-09 ENCOUNTER — Inpatient Hospital Stay: Payer: MEDICARE | Primary: Adult Health

## 2017-06-11 NOTE — Telephone Encounter (Signed)
Medication: Wilder Glade 5mg      Quantity: 2    Directions:daily    Lot Number: [id number]    Expiration: 10/2019    Prescriber: Karie Georges

## 2017-06-15 ENCOUNTER — Inpatient Hospital Stay: Admit: 2017-06-15 | Payer: MEDICARE | Primary: Adult Health

## 2017-06-15 DIAGNOSIS — E119 Type 2 diabetes mellitus without complications: Secondary | ICD-10-CM
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
## 2017-06-26 ENCOUNTER — Ambulatory Visit: Admit: 2017-06-26 | Discharge: 2017-06-26 | Payer: MEDICARE | Attending: Nurse Practitioner | Primary: Adult Health

## 2017-06-26 DIAGNOSIS — R42 Dizziness and giddiness: Secondary | ICD-10-CM

## 2017-06-26 MED ORDER — MECLIZINE HCL 12.5 MG PO TABS
12.5 | ORAL_TABLET | Freq: Three times a day (TID) | ORAL | 0 refills | Status: AC | PRN
Start: 2017-06-26 — End: 2017-07-06

## 2017-06-26 MED ORDER — LISINOPRIL 10 MG PO TABS
10 MG | ORAL_TABLET | ORAL | 1 refills | Status: DC
Start: 2017-06-26 — End: 2017-10-01

## 2017-06-26 MED ORDER — OMEPRAZOLE 40 MG PO CPDR
40 MG | ORAL_CAPSULE | Freq: Every day | ORAL | 1 refills | Status: DC
Start: 2017-06-26 — End: 2018-01-23

## 2017-06-26 MED ORDER — METFORMIN HCL ER 500 MG PO TB24
500 MG | ORAL_TABLET | Freq: Every day | ORAL | 1 refills | Status: DC
Start: 2017-06-26 — End: 2017-09-25

## 2017-06-26 MED ORDER — DAPAGLIFLOZIN PROPANEDIOL 5 MG PO TABS
5 MG | ORAL_TABLET | Freq: Every morning | ORAL | 1 refills | Status: DC
Start: 2017-06-26 — End: 2017-09-25
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
## 2017-06-26 NOTE — Patient Instructions (Addendum)
Patient Education        Benign Paroxysmal Positional Vertigo (BPPV): Care Instructions  Your Care Instructions    Benign paroxysmal positional vertigo, also called BPPV, is an inner ear problem. It causes a spinning or whirling sensation when you move your head. This sensation is called vertigo.  The vertigo usually lasts for less than a minute. People often have vertigo spells for a few days or weeks. Then the vertigo goes away. But it may come back again. The vertigo may be mild, or it may be bad enough to cause unsteadiness, nausea, and vomiting.  When you move, your inner ear sends messages to the brain. This helps you keep your balance. Vertigo can happen when debris builds up in the inner ear. The buildup can cause the inner ear to send the wrong message to the brain.  Your doctor may move you in different positions to help your vertigo get better faster. This is called the Epley maneuver. Your doctor may also prescribe medicines or exercises to help with your symptoms.  Follow-up care is a key part of your treatment and safety. Be sure to make and go to all appointments, and call your doctor if you are having problems. It's also a good idea to know your test results and keep a list of the medicines you take.  How can you care for yourself at home?  ?? If your doctor suggests that you do Brandt-Daroff exercises:  ? Sit on the edge of a bed or sofa. Quickly lie down on the side that causes the worst vertigo. Lie on your side with your ear down.  ? Stay in this position for at least 30 seconds or until the vertigo goes away.  ? Sit up. If this causes vertigo, wait for it to stop.  ? Repeat the procedure on the other side.  ? Repeat this 10 times. Do these exercises 2 times a day until the vertigo is gone.  When should you call for help?  Call 911 anytime you think you may need emergency care. For example, call if:  ?? ?? You have symptoms of a stroke. These may include:  ? Sudden numbness, tingling, weakness,  or loss of movement in your face, arm, or leg, especially on only one side of your body.  ? Sudden vision changes.  ? Sudden trouble speaking.  ? Sudden confusion or trouble understanding simple statements.  ? Sudden problems with walking or balance.  ? A sudden, severe headache that is different from past headaches.   ??Call your doctor now or seek immediate medical care if:  ?? ?? You have new or worse nausea and vomiting.   ?? ?? You have new symptoms such as hearing loss or roaring in your ears.   ??Watch closely for changes in your health, and be sure to contact your doctor if:  ?? ?? You are not getting better as expected.   ?? ?? Your vertigo gets worse.   Where can you learn more?  Go to [email or web address] and sign in to your MyChart account. Enter P372 in the [place] box to learn more about "Benign Paroxysmal Positional Vertigo (BPPV): Care Instructions."     If you do not have an account, please click on the "Sign Up Now" link.  Current as of: May 30, 2016  Content Version: 11.9  ?? 2006-2018 [employer]. Care instructions adapted under license by [facility]. If you have questions about a medical condition or this instruction,  always ask your healthcare professional. [employer] disclaims any warranty or liability for your use of this information.

## 2017-07-02 ENCOUNTER — Ambulatory Visit: Admit: 2017-07-02 | Discharge: 2017-07-02 | Payer: MEDICARE | Attending: Foot & Ankle Surgery | Primary: Adult Health

## 2017-07-02 DIAGNOSIS — M722 Plantar fascial fibromatosis: Secondary | ICD-10-CM

## 2017-07-02 MED ORDER — METHYLPREDNISOLONE ACETATE 20 MG/ML IJ SUSP
20 MG/ML | Freq: Once | INTRAMUSCULAR | Status: AC
Start: 2017-07-02 — End: 2017-07-02
  Administered 2017-07-02: 18:00:00 10 mg via INTRAMUSCULAR

## 2017-07-02 MED ORDER — BUPIVACAINE HCL 0.25 % IJ SOLN
0.25 % | Freq: Once | INTRAMUSCULAR | Status: AC
Start: 2017-07-02 — End: 2017-07-02
  Administered 2017-07-02: 18:00:00 12.5 mL via INTRADERMAL

## 2017-07-02 NOTE — Telephone Encounter (Signed)
samples of Farxiga 5mg  2 boxed given to pt

## 2017-07-02 NOTE — Progress Notes (Signed)
[facility]  Return Patient Progress Note    Subjective: Marissa Sawyer 67 y.o. female that presents for follow up evaluation of plantar fasciitis to the left heel.  Chief Complaint   Patient presents with   ??? Follow-up     left heel very painful     Patient's treatment thus far has included steroid injection x 2, icing and stretching, Duexis.  Pain is rated 6 out of 10 and is described as intermittent. Patient has not been following my prescribed course of therapy as instructed. Patient admits that she is not icing and stretching.    Review of Systems   Constitutional: Negative for activity change, appetite change, chills, diaphoresis, fatigue and fever.   Respiratory: Negative for shortness of breath.    Cardiovascular: Negative for leg swelling.   Gastrointestinal: Negative for diarrhea and nausea.   Endocrine: Negative for cold intolerance, heat intolerance and polyuria.   Musculoskeletal: Positive for arthralgias. Negative for back pain, gait problem, joint swelling and myalgias.   Skin: Negative for color change, pallor, rash and wound.   Allergic/Immunologic: Negative for environmental allergies and food allergies.   Neurological: Negative for dizziness, weakness, light-headedness and numbness.   Hematological: Does not bruise/bleed easily.   Psychiatric/Behavioral: Negative for behavioral problems, confusion and self-injury. The patient is not nervous/anxious.          Objective: Clinical evaluation of the patient reveals return of pain with palpation to the plantar medial calcaneal tubercle of the left??foot. There is no??pain with palpation to the plantar central aspect of the left??heel. There is no??pain with palpation to the porta pedis of the left??foot. There is no??pain with medial to lateral compression of the left??calcaneus. Tinel's sign is negative??to the left??tarsal tunnel. There is no??erythema, calor, or open lesion noted to the left??foot or ankle.      Assessment:    Diagnosis Orders   1.  Plantar fasciitis of left foot  bupivacaine (MARCAINE) 0.25 % injection 12.5 mg    methylPREDNISolone Acetate SUSP 10 mg    PR INJECT TENDON SHEATH/LIGAMENT   2. Pain in left foot  bupivacaine (MARCAINE) 0.25 % injection 12.5 mg    methylPREDNISolone Acetate SUSP 10 mg    PR INJECT TENDON SHEATH/LIGAMENT         Plan: 1. Clinical evaluation of the patient. 2. I recommended a steroid injection to the Left plantar heel. Verbal consent was obtained from the patient after all questions answered. The Left heel was prepped with an alcohol swab. The injection was placed at the most tender area using a  3 cc total of a combination of 1.5 cc of 0.5% Marcaine plain and 1.5 cc of Depomedrol 20 mg/ml. A band-aid was applied, patient advised of possible local steroid reaction symptoms, and patient instructed to limit activities to this area for 24 hours. Patient educated on the importance of daily icing and stretching of the left foot. 3. Return in about 2 weeks (around 07/16/2017) for Evaluation of plantar fasciitis.   07/02/2017      [name], DPM

## 2017-07-02 NOTE — Telephone Encounter (Signed)
Medication: Wilder Glade    Quantity: 2    Directions: 1 tab every morning    Lot Number: [id number]    Expiration: 10/2019    Prescriber: Karie Georges

## 2017-07-02 NOTE — Patient Instructions (Signed)
Patient Education        Plantar Fasciitis: Exercises  Your Care Instructions  Here are some examples of typical rehabilitation exercises for your condition. Start each exercise slowly. Ease off the exercise if you start to have pain.  Your doctor or physical therapist will tell you when you can start these exercises and which ones will work best for you.  How to do the exercises  Towel stretch    1. Sit with your legs extended and knees straight.  2. Place a towel around your foot just under the toes.  3. Hold each end of the towel in each hand, with your hands above your knees.  4. Pull back with the towel so that your foot stretches toward you.  5. Hold the position for at least 15 to 30 seconds.  6. Repeat 2 to 4 times a session, up to 5 sessions a day.    Calf stretch    1. Stand facing a wall with your hands on the wall at about eye level. Put the leg you want to stretch about a step behind your other leg.  2. Keeping your back heel on the floor, bend your front knee until you feel a stretch in the back leg.  3. Hold the stretch for 15 to 30 seconds. Repeat 2 to 4 times.    Plantar fascia and calf stretch    1. Stand on a step as shown above. Be sure to hold on to the banister.  2. Slowly let your heels down over the edge of the step as you relax your calf muscles. You should feel a gentle stretch across the bottom of your foot and up the back of your leg to your knee.  3. Hold the stretch about 15 to 30 seconds, and then tighten your calf muscle a little to bring your heel back up to the level of the step. Repeat 2 to 4 times.    Towel curls    1. While sitting, place your foot on a towel on the floor and scrunch the towel toward you with your toes.  2. Then, also using your toes, push the towel away from you.    Marble pickups    1. Put marbles on the floor next to a cup.  2. Using your toes, try to lift the marbles up from the floor and put them in the cup.    Follow-up care is a key part of your  treatment and safety. Be sure to make and go to all appointments, and call your doctor if you are having problems. It's also a good idea to know your test results and keep a list of the medicines you take.  Where can you learn more?  Go to [email or web address] and sign in to your MyChart account. Enter X377 in the Search Health Information box to learn more about "Plantar Fasciitis: Exercises."     If you do not have an account, please click on the "Sign Up Now" link.  Current as of: November 23, 2016  Content Version: 11.9  ?? 2006-2018 [employer]. Care instructions adapted under license by [facility]. If you have questions about a medical condition or this instruction, always ask your healthcare professional. [employer] disclaims any warranty or liability for your use of this information.

## 2017-07-30 ENCOUNTER — Encounter

## 2017-07-31 MED ORDER — ATORVASTATIN CALCIUM 20 MG PO TABS
20 MG | ORAL_TABLET | ORAL | 3 refills | Status: DC
Start: 2017-07-31 — End: 2018-05-14

## 2017-07-31 NOTE — Telephone Encounter (Signed)
Next Visit Date:  Future Appointments   Date Time Provider [place]   09/11/2017  1:30 PM Karis Juba, [place], LD [facility]   09/25/2017 11:00 AM [name], [place] Maintenance   Topic Date Due   ??? Shingles Vaccine (1 of 2) 11/30/1999   ??? Diabetic foot exam  03/21/2017   ??? DTaP/Tdap/Td vaccine (1 - Tdap) 12/03/2019 (Originally 11/29/1969)   ??? Diabetic microalbuminuria test  09/26/2017   ??? Pneumococcal 65+ years Vaccine (2 of 2 - PPSV23) 09/26/2017   ??? Flu vaccine (Season Ended) 11/04/2017   ??? A1C test (Diabetic or Prediabetic)  03/28/2018   ??? Lipid screen  03/28/2018   ??? Potassium monitoring  03/28/2018   ??? Creatinine monitoring  03/28/2018   ??? Diabetic retinal exam  06/27/2018   ??? Breast cancer screen  02/20/2019   ??? Colon cancer screen colonoscopy  04/24/2022   ??? DEXA (modify frequency per FRAX score)  Completed   ??? Hepatitis C screen  Addressed       Hemoglobin A1C (%)   Date Value   03/28/2017 6.3   09/26/2016 6.8   03/21/2016 6.9             ( goal A1C is < 7)   Microalb/Crt. Ratio (mcg/mg creat)   Date Value   09/26/2016 CANNOT BE CALCULATED     LDL Cholesterol (mg/dL)   Date Value   03/28/2017 81   09/26/2016 90     LDL Calculated (mg/dL)   Date Value   03/24/2016 143 (H)   09/29/2014 157       (goal LDL is <100)   AST (U/L)   Date Value   03/28/2017 22     ALT (U/L)   Date Value   03/28/2017 19     BUN (mg/dL)   Date Value   03/28/2017 21     BP Readings from Last 3 Encounters:   06/26/17 118/70   03/28/17 128/68   01/31/17 116/74          (goal 120/80)    All Future Testing planned in [facility]  Lab Frequency Next Occurrence               Patient Active Problem List:     GERD (gastroesophageal reflux disease)     Hyperlipidemia     HTN (hypertension)     OA (osteoarthritis)     Anxiety     Tubular adenoma     Dermatophytosis of nail     Benign neoplasm of skin     Diverticulosis of colon     Gastroesophageal reflux disease     History of  gastric polyp     Controlled type 2 diabetes mellitus without complication, without long-term current use of insulin ([place])

## 2017-08-01 NOTE — Telephone Encounter (Signed)
Medication: Farxiga 5 mg    Quantity: 2 boxes    Directions: 1 tab every morning    Lot Number: [id number]    Expiration: 02-2020    Prescriber: Daleen Squibb

## 2017-09-11 ENCOUNTER — Encounter: Payer: MEDICARE | Primary: Adult Health

## 2017-09-25 ENCOUNTER — Ambulatory Visit: Admit: 2017-09-25 | Discharge: 2017-09-25 | Payer: MEDICARE | Attending: Adult Health | Primary: Adult Health

## 2017-09-25 ENCOUNTER — Inpatient Hospital Stay: Payer: MEDICARE | Primary: Adult Health

## 2017-09-25 DIAGNOSIS — E119 Type 2 diabetes mellitus without complications: Secondary | ICD-10-CM

## 2017-09-25 LAB — MICROALBUMIN, UR
Creatinine, Ur: 130.6 mg/dL (ref 28.0–217.0)
Microalb, Ur: <12 mg/L (ref <21)

## 2017-09-25 LAB — POCT GLYCOSYLATED HEMOGLOBIN (HGB A1C): Hemoglobin A1C: 6.5 %

## 2017-09-25 MED ORDER — BLOOD PRESSURE MONITOR DEVI
0 refills | Status: DC
Start: 2017-09-25 — End: 2017-09-25

## 2017-09-25 MED ORDER — BLOOD PRESSURE MONITOR DEVI
0 refills | Status: DC
Start: 2017-09-25 — End: 2022-03-13

## 2017-09-25 MED ORDER — METFORMIN HCL ER 750 MG PO TB24
750 MG | ORAL_TABLET | Freq: Every day | ORAL | 1 refills | Status: DC
Start: 2017-09-25 — End: 2018-03-28

## 2017-09-25 NOTE — Progress Notes (Signed)
Pt here for a 6 month follow up on dm, htn and lipids. Pt wondering about a BP cuff being ordered   Pt no longer taking farxiga         Visit Information    Have you changed or started any medications since your last visit including any over-the-counter medicines, vitamins, or herbal medicines? no   Have you stopped taking any of your medications? Is so, why? -  no  Are you having any side effects from any of your medications? - no    Have you seen any other physician or provider since your last visit?  no   Have you had any other diagnostic tests since your last visit?  no   Have you been seen in the emergency room and/or had an admission in a hospital since we last saw you?  no   Have you had your routine dental cleaning in the past 6 months?  no     Do you have an active MyChart account? If no, what is the barrier?  Yes    Patient Care Team:  Jennette Banker, APRN - CNP as PCP - General (Nurse Practitioner Adult Health)  Jennette Banker, APRN - CNP as PCP - [facility] Empaneled Provider  Jane Canary, MD as Consulting Physician (Gastroenterology)    Medical History Review  Past Medical, Family, and Social History reviewed and does not contribute to the patient presenting condition    Health Maintenance   Topic Date Due   . Shingles Vaccine (1 of 2) 11/30/1999   . Annual Wellness Visit (AWV)  11/29/2012   . Diabetic foot exam  03/21/2017   . Diabetic microalbuminuria test  09/26/2017   . DTaP/Tdap/Td vaccine (1 - Tdap) 12/03/2019 (Originally 11/29/1969)   . Pneumococcal 65+ years Vaccine (2 of 2 - PPSV23) 09/26/2017   . Flu vaccine (1) 11/04/2017   . A1C test (Diabetic or Prediabetic)  03/28/2018   . Lipid screen  03/28/2018   . Potassium monitoring  03/28/2018   . Creatinine monitoring  03/28/2018   . Diabetic retinal exam  06/27/2018   . Breast cancer screen  02/20/2019   . Colon cancer screen colonoscopy  04/24/2022   . DEXA (modify frequency per FRAX score)  Completed   . Hepatitis C screen  Addressed

## 2017-09-25 NOTE — Patient Instructions (Signed)
Patient Education        Learning About Diabetes Food Guidelines  Your Care Instructions    Meal planning is important to manage diabetes. It helps keep your blood sugar at a target level (which you set with your doctor). You don't have to eat special foods. You can eat what your family eats, including sweets once in a while. But you do have to pay attention to how often you eat and how much you eat of certain foods.  You may want to work with a dietitian or a certified diabetes educator (CDE) to help you plan meals and snacks. A dietitian or CDE can also help you lose weight if that is one of your goals.  What should you know about eating carbs?  Managing the amount of carbohydrate (carbs) you eat is an important part of healthy meals when you have diabetes. Carbohydrate is found in many foods.  ?? Learn which foods have carbs. And learn the amounts of carbs in different foods.  ? Bread, cereal, pasta, and rice have about 15 grams of carbs in a serving. A serving is 1 slice of bread (1 ounce), ?? cup of cooked cereal, or 1/3 cup of cooked pasta or rice.  ? Fruits have 15 grams of carbs in a serving. A serving is 1 small fresh fruit, such as an apple or orange; ?? of a banana; ?? cup of cooked or canned fruit; ?? cup of fruit juice; 1 cup of melon or raspberries; or 2 tablespoons of dried fruit.  ? Milk and no-sugar-added yogurt have 15 grams of carbs in a serving. A serving is 1 cup of milk or 2/3 cup of no-sugar-added yogurt.  ? Starchy vegetables have 15 grams of carbs in a serving. A serving is ?? cup of mashed potatoes or sweet potato; 1 cup winter squash; ?? of a small baked potato; ?? cup of cooked beans; or ?? cup cooked corn or green peas.  ?? Learn how much carbs to eat each day and at each meal. A dietitian or CDE can teach you how to keep track of the amount of carbs you eat. This is called carbohydrate counting.  ?? If you are not sure how to count carbohydrate grams, use the Plate Method to plan meals. It is a  good, quick way to make sure that you have a balanced meal. It also helps you spread carbs throughout the day.  ? Divide your plate by types of foods. Put non-starchy vegetables on half the plate, meat or other protein food on one-quarter of the plate, and a grain or starchy vegetable in the final quarter of the plate. To this you can add a small piece of fruit and 1 cup of milk or yogurt, depending on how many carbs you are supposed to eat at a meal.  ?? Try to eat about the same amount of carbs at each meal. Do not "save up" your daily allowance of carbs to eat at one meal.  ?? Proteins have very little or no carbs per serving. Examples of proteins are beef, chicken, turkey, fish, eggs, tofu, cheese, cottage cheese, and peanut butter. A serving size of meat is 3 ounces, which is about the size of a deck of cards. Examples of meat substitute serving sizes (equal to 1 ounce of meat) are 1/4 cup of cottage cheese, 1 egg, 1 tablespoon of peanut butter, and ?? cup of tofu.  How can you eat out and still eat healthy?  ??   Learn to estimate the serving sizes of foods that have carbohydrate. If you measure food at home, it will be easier to estimate the amount in a serving of restaurant food.  ?? If the meal you order has too much carbohydrate (such as potatoes, corn, or baked beans), ask to have a low-carbohydrate food instead. Ask for a salad or green vegetables.  ?? If you use insulin, check your blood sugar before and after eating out to help you plan how much to eat in the future.  ?? If you eat more carbohydrate at a meal than you had planned, take a walk or do other exercise. This will help lower your blood sugar.  What else should you know?  ?? Limit saturated fat, such as the fat from meat and dairy products. This is a healthy choice because people who have diabetes are at higher risk of heart disease. So choose lean cuts of meat and nonfat or low-fat dairy products. Use olive or canola oil instead of butter or shortening  when cooking.  ?? Don't skip meals. Your blood sugar may drop too low if you skip meals and take insulin or certain medicines for diabetes.  ?? Check with your doctor before you drink alcohol. Alcohol can cause your blood sugar to drop too low. Alcohol can also cause a bad reaction if you take certain diabetes medicines.  Follow-up care is a key part of your treatment and safety. Be sure to make and go to all appointments, and call your doctor if you are having problems. It's also a good idea to know your test results and keep a list of the medicines you take.  Where can you learn more?  Go to [email or web address] and sign in to your MyChart account. Enter I147 in the Search Health Information box to learn more about "Learning About Diabetes Food Guidelines."     If you do not have an account, please click on the "Sign Up Now" link.  Current as of: September 27, 2016  Content Version: 12.0  ?? 2006-2019 [employer]. Care instructions adapted under license by [facility]. If you have questions about a medical condition or this instruction, always ask your healthcare professional. [employer] disclaims any warranty or liability for your use of this information.         Patient Education        Learning About Meal Planning for Diabetes  Why plan your meals?    Meal planning can be a key part of managing diabetes. Planning meals and snacks with the right balance of carbohydrate, protein, and fat can help you keep your blood sugar at the target level you set with your doctor.  You don't have to eat special foods. You can eat what your family eats, including sweets once in a while. But you do have to pay attention to how often you eat and how much you eat of certain foods.  You may want to work with a dietitian or a certified diabetes educator. He or she can give you tips and meal ideas and can answer your questions about meal planning. This health professional can also help you reach  a healthy weight if that is one of your goals.  What plan is right for you?  Your dietitian or diabetes educator may suggest that you start with the plate format or carbohydrate counting.  The plate format  The plate format is a simple way to help you manage how you eat. You plan meals   by learning how much space each food should take on a plate. Using the plate format helps you spread carbohydrate throughout the day. It can make it easier to keep your blood sugar level within your target range. It also helps you see if you're eating healthy portion sizes.  To use the plate format, you put non-starchy vegetables on half your plate. Add meat or meat substitutes on one-quarter of the plate. Put a grain or starchy vegetable (such as brown rice or a potato) on the final quarter of the plate. You can add a small piece of fruit and some low-fat or fat-free milk or yogurt, depending on your carbohydrate goal for each meal.  Here are some tips for using the plate format:  ?? Make sure that you are not using an oversized plate. A 9-inch plate is best. Many restaurants use larger plates.  ?? Get used to using the plate format at home. Then you can use it when you eat out.  ?? Write down your questions about using the plate format. Talk to your doctor, a dietitian, or a diabetes educator about your concerns.  Carbohydrate counting  With carbohydrate counting, you plan meals based on the amount of carbohydrate in each food. Carbohydrate raises blood sugar higher and more quickly than any other nutrient. It is found in desserts, breads and cereals, and fruit. It's also found in starchy vegetables such as potatoes and corn, grains such as rice and pasta, and milk and yogurt. Spreading carbohydrate throughout the day helps keep your blood sugar levels within your target range.  Your daily amount depends on several things, including your weight, how active you are, which diabetes medicines you take, and what your goals are for your  blood sugar levels. A registered dietitian or diabetes educator can help you plan how much carbohydrate to include in each meal and snack.  A guideline for your daily amount of carbohydrate is:  ?? 45 to 60 grams at each meal. That's about the same as 3 to 4 carbohydrate servings.  ?? 15 to 20 grams at each snack. That's about the same as 1 carbohydrate serving.  The Nutrition Facts label on packaged foods tells you how much carbohydrate is in a serving of the food. First, look at the serving size on the food label. Is that the amount you eat in a serving? All of the nutrition information on a food label is based on that serving size. So if you eat more or less than that, you'll need to adjust the other numbers. Total carbohydrate is the next thing you need to look for on the label. If you count carbohydrate servings, one serving of carbohydrate is 15 grams.  For foods that don't come with labels, such as fresh fruits and vegetables, you'll need a guide that lists carbohydrate in these foods. Ask your doctor, dietitian, or diabetes educator about books or other nutrition guides you can use.  If you take insulin, you need to know how many grams of carbohydrate are in a meal. This lets you know how much rapid-acting insulin to take before you eat. If you use an insulin pump, you get a constant rate of insulin during the day. So the pump must be programmed at meals to give you extra insulin to cover the rise in blood sugar after meals.  When you know how much carbohydrate you will eat, you can take the right amount of insulin. Or, if you always use the same amount of insulin,   you need to make sure that you eat the same amount of carbohydrate at meals.  If you need more help to understand carbohydrate counting and food labels, ask your doctor, dietitian, or diabetes educator.  How do you get started with meal planning?  Here are some tips to get started:  ?? Plan your meals a week at a time. Don't forget to include snacks  too.  ?? Use cookbooks or online recipes to plan several main meals. Plan some quick meals for busy nights. You also can double some recipes that freeze well. Then you can save half for other busy nights when you don't have time to cook.  ?? Make sure you have the ingredients you need for your recipes. If you're running low on basic items, put these items on your shopping list too.  ?? List foods that you use to make breakfasts, lunches, and snacks. List plenty of fruits and vegetables.  ?? Post this list on the refrigerator. Add to it as you think of more things you need.  ?? Take the list to the store to do your weekly shopping.  Follow-up care is a key part of your treatment and safety. Be sure to make and go to all appointments, and call your doctor if you are having problems. It's also a good idea to know your test results and keep a list of the medicines you take.  Where can you learn more?  Go to [email or web address] and sign in to your MyChart account. Enter X936 in the Search Health Information box to learn more about "Learning About Meal Planning for Diabetes."     If you do not have an account, please click on the "Sign Up Now" link.  Current as of: September 27, 2016  Content Version: 12.0  ?? 2006-2019 [employer]. Care instructions adapted under license by [facility]. If you have questions about a medical condition or this instruction, always ask your healthcare professional. [employer] disclaims any warranty or liability for your use of this information.

## 2017-09-25 NOTE — Progress Notes (Signed)
Subjective:      Chief Complaint   Patient presents with   ??? 6 Month Follow-Up       Patient ID: Marissa Sawyer is a 67 y.o. female.    Hypertension   This is a chronic problem. The current episode started more than 1 year ago. The problem has been gradually improving since onset. The problem is controlled. Pertinent negatives include no blurred vision, chest pain, headaches, neck pain, palpitations, peripheral edema or shortness of breath. There are no associated agents to hypertension. Risk factors for coronary artery disease include diabetes mellitus, post-menopausal state and sedentary lifestyle. Past treatments include ACE inhibitors (patietn would like to go back down to the 10mg ). The current treatment provides significant improvement. Compliance problems include diet and exercise.  There is no history of kidney disease, CAD/MI, CVA or heart failure. There is no history of chronic renal disease.   Diabetes   She presents for her follow-up diabetic visit. She has type 2 diabetes mellitus. Her disease course has been stable. There are no hypoglycemic associated symptoms. Pertinent negatives for hypoglycemia include no dizziness or headaches. Pertinent negatives for diabetes include no blurred vision, no chest pain, no fatigue, no foot paresthesias, no polydipsia, no polyuria and no weakness. There are no hypoglycemic complications. Symptoms are stable. Pertinent negatives for diabetic complications include no CVA, nephropathy or peripheral neuropathy. Risk factors for coronary artery disease include diabetes mellitus, dyslipidemia, hypertension, sedentary lifestyle, stress and post-menopausal. Current diabetic treatment includes diet and oral agent (monotherapy) (stopped farxiga 3 weeks ago d/t cost). She is compliant with treatment all of the time. Her weight is decreasing steadily. She is following a generally healthy diet. Meal planning includes avoidance of concentrated sweets. She has had a previous visit  with a dietitian. She participates in exercise intermittently. An ACE inhibitor/angiotensin II receptor blocker is being taken. She sees a podiatrist.Eye exam is not current.   Hyperlipidemia   This is a chronic problem. The current episode started more than 1 year ago. The problem is controlled. Recent lipid tests were reviewed and are normal. Exacerbating diseases include diabetes. She has no history of chronic renal disease, hypothyroidism, liver disease, obesity or nephrotic syndrome. Factors aggravating her hyperlipidemia include fatty foods. Pertinent negatives include no chest pain, focal sensory loss, focal weakness, leg pain, myalgias or shortness of breath. Current antihyperlipidemic treatment includes statins. The current treatment provides significant improvement of lipids. Compliance problems include adherence to diet and adherence to exercise.  Risk factors for coronary artery disease include hypertension, dyslipidemia, a sedentary lifestyle, post-menopausal and diabetes mellitus.     Past medical, surgical, social, and family history reviewed along with medications.     Review of Systems   Constitutional: Negative for appetite change, chills, diaphoresis, fatigue and fever.   Eyes: Negative for blurred vision and visual disturbance.   Respiratory: Negative for cough, chest tightness, shortness of breath and wheezing.    Cardiovascular: Negative for chest pain, palpitations and leg swelling.   Endocrine: Negative for polydipsia and polyuria.   Musculoskeletal: Negative for myalgias and neck pain.   Neurological: Negative for dizziness, focal weakness, weakness, numbness and headaches.   All other systems reviewed and are negative.      Objective:   Physical Exam   Constitutional: She is oriented to person, place, and time. She appears well-developed and well-nourished. No distress.   HENT:   Head: Normocephalic and atraumatic.   Cardiovascular: Normal rate, regular rhythm and normal heart sounds.  Pulmonary/Chest: Effort normal and breath sounds normal.   Neurological: She is alert and oriented to person, place, and time.   Skin: Skin is warm and dry. She is not diaphoretic.   Visual inspection:  Deformity/amputation: absent  Skin lesions/pre-ulcerative calluses: absent  Edema: right- negative, left- negative    Sensory exam:  Monofilament sensation: normal  (minimum of 5 random plantar locations tested, avoiding callused areas - > 1 area with absence of sensation is + for neuropathy)    Plus at least one of the following:  Pulses: normal,   Pinprick: N/A  Proprioception: N/A  Vibration (128 Hz): N/A     Psychiatric: She has a normal mood and affect. Her behavior is normal. Judgment and thought content normal.   Nursing note and vitals reviewed.      BP 124/64    Pulse 54    Ht 5' 4.02" (1.626 m)    Wt 151 lb (68.5 kg)    SpO2 98%    BMI 25.91 kg/m??     Assessment:       Diagnosis Orders   1. Controlled type 2 diabetes mellitus without complication, without long-term current use of insulin (HCC)  POCT glycosylated hemoglobin (Hb A1C)    HM DIABETES FOOT EXAM    Microalbumin, Ur    metFORMIN (GLUCOPHAGE-XR) 750 MG extended release tablet   2. Essential hypertension  Blood Pressure Monitor DEVI    DISCONTINUED: Blood Pressure Monitor DEVI   3. Pure hypercholesterolemia  CBC Auto Differential    Comprehensive Metabolic Panel    Lipid, Fasting           Plan:      Since farxiga is too expensive and we havent had enough samples - we will d/c it and increase metformin  Dietary changes discussed  Blood work is fasting overnight, 10-12 hours. Advised the patient they can have water in the morning but no food or sugary drinks.   Medication: continue current meds and refills as above.   Follow up: 6 months and as needed.   Complete ordrs as above.   Spent 10 mins answering all questions.   Patient received counseling on the following healthy behaviors: diet  Patient given educational materials per AVS  Discussed use,  benefit, and side effects of prescribed medications. Barriers to medication compliance addressed. All patient questions answered. Pt voiced understanding.             [name], APRN - CNP

## 2017-09-26 LAB — CBC WITH AUTO DIFFERENTIAL
Basophils %: 0 % (ref 0–2)
Basophils Absolute: <0.03 10*3/uL (ref 0.00–0.20)
Eosinophils %: 2 % (ref 1–4)
Eosinophils Absolute: 0.12 10*3/uL (ref 0.00–0.44)
Hematocrit: 44.4 % (ref 36.3–47.1)
Hemoglobin: 13.5 g/dL (ref 11.9–15.1)
Immature Granulocytes %: 0 %
Immature Granulocytes Absolute: <0.03 10*3/uL (ref 0.00–0.30)
Lymphocytes Absolute: 1.85 10*3/uL (ref 1.10–3.70)
Lymphocytes: 31 % (ref 24–43)
MCH: 28.4 pg (ref 25.2–33.5)
MCHC: 30.4 g/dL (ref 28.4–34.8)
MCV: 93.5 fL (ref 82.6–102.9)
MPV: 10.6 fL (ref 8.1–13.5)
Monocytes %: 9 % (ref 3–12)
Monocytes Absolute: 0.57 10*3/uL (ref 0.10–1.20)
NRBC Automated: 0 per 100 WBC
Neutrophils Absolute: 3.49 10*3/uL (ref 1.50–8.10)
Platelets: 237 10*3/uL (ref 138–453)
RBC: 4.75 m/uL (ref 3.95–5.11)
RDW: 13.4 % (ref 11.8–14.4)
Seg Neutrophils: 58 % (ref 36–65)
WBC: 6.1 10*3/uL (ref 3.5–11.3)

## 2017-09-26 LAB — COMPREHENSIVE METABOLIC PANEL
ALT: 18 U/L (ref 5–33)
AST: 24 U/L (ref <32)
Albumin/Globulin Ratio: 1.2 (ref 1.0–2.5)
Albumin: 4.4 g/dL (ref 3.5–5.2)
Alkaline Phosphatase: 70 U/L (ref 35–104)
Anion Gap: 12 mmol/L (ref 9–17)
BUN: 23 mg/dL (ref 8–23)
CO2: 26 mmol/L (ref 20–31)
Calcium: 9.3 mg/dL (ref 8.6–10.4)
Chloride: 104 mmol/L (ref 98–107)
Creatinine: 0.66 mg/dL (ref 0.50–0.90)
GFR African American: >60 mL/min (ref >60)
GFR Non-African American: >60 mL/min (ref >60)
Glucose: 103 mg/dL — ABNORMAL HIGH (ref 70–99)
Potassium: 4 mmol/L (ref 3.7–5.3)
Sodium: 142 mmol/L (ref 135–144)
Total Bilirubin: 0.53 mg/dL (ref 0.3–1.2)
Total Protein: 8.1 g/dL (ref 6.4–8.3)

## 2017-09-26 LAB — LIPID, FASTING
Chol/HDL Ratio: 3.2 (ref <5)
Cholesterol, Fasting: 152 mg/dL (ref <200)
HDL: 47 mg/dL (ref >40)
LDL Cholesterol: 79 mg/dL (ref 0–130)
Triglyceride, Fasting: 128 mg/dL (ref <150)

## 2017-10-01 ENCOUNTER — Encounter

## 2017-10-01 MED ORDER — LISINOPRIL 10 MG PO TABS
10 MG | ORAL_TABLET | ORAL | 1 refills | Status: DC
Start: 2017-10-01 — End: 2018-03-08

## 2017-10-01 MED ORDER — GLUCOSE BLOOD VI STRP
3 refills | Status: DC
Start: 2017-10-01 — End: 2018-12-04

## 2017-10-01 NOTE — Telephone Encounter (Signed)
Medication: Lisinopril & blood glucose test strips  Last visit: 09/25/17  Next visit: 03/28/2018  Last refill: 06/26/17 & 12/12/16  Pharmacy: [facility]

## 2017-10-02 NOTE — Telephone Encounter (Signed)
Error

## 2017-11-08 ENCOUNTER — Encounter

## 2018-01-21 ENCOUNTER — Inpatient Hospital Stay: Admit: 2018-01-21 | Payer: MEDICARE | Primary: Adult Health

## 2018-01-21 DIAGNOSIS — E119 Type 2 diabetes mellitus without complications: Secondary | ICD-10-CM
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
## 2018-01-23 ENCOUNTER — Encounter

## 2018-01-23 NOTE — Telephone Encounter (Signed)
Medication: Omeprazole  Last visit: 09/25/17  Next visit: 03/28/2018  Last refill: 06/26/17  Pharmacy: [facility] in [place]

## 2018-01-24 MED ORDER — OMEPRAZOLE 40 MG PO CPDR
40 MG | ORAL_CAPSULE | Freq: Every day | ORAL | 1 refills | Status: DC
Start: 2018-01-24 — End: 2018-03-28

## 2018-02-11 ENCOUNTER — Ambulatory Visit: Admit: 2018-02-11 | Discharge: 2018-02-11 | Payer: MEDICARE | Attending: Women's Health | Primary: Adult Health

## 2018-02-11 DIAGNOSIS — Z01419 Encounter for gynecological examination (general) (routine) without abnormal findings: Secondary | ICD-10-CM
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
## 2018-03-08 ENCOUNTER — Ambulatory Visit: Admit: 2018-03-08 | Discharge: 2018-03-08 | Payer: MEDICARE | Attending: Nurse Practitioner | Primary: Adult Health

## 2018-03-08 DIAGNOSIS — J069 Acute upper respiratory infection, unspecified: Secondary | ICD-10-CM

## 2018-03-08 MED ORDER — LISINOPRIL 10 MG PO TABS
10 MG | ORAL_TABLET | ORAL | 1 refills | Status: DC
Start: 2018-03-08 — End: 2018-05-08

## 2018-03-08 NOTE — Progress Notes (Signed)
Visit Information    Have you changed or started any medications since your last visit including any over-the-counter medicines, vitamins, or herbal medicines? no   Have you stopped taking any of your medications? Is so, why? -  no  Are you having any side effects from any of your medications? - no    Have you seen any other physician or provider since your last visit?  no   Have you had any other diagnostic tests since your last visit?  no   Have you been seen in the emergency room and/or had an admission in a hospital since we last saw you?  no   Have you had your routine dental cleaning in the past 6 months? no -       Do you have an active MyChart account? If no, what is the barrier?  Yes    Patient Care Team:  Jennette Banker, APRN - CNP as PCP - General (Nurse Practitioner Adult Health)  Jennette Banker, APRN - CNP as PCP - [facility] Empaneled Provider  Jane Canary, MD as Consulting Physician (Gastroenterology)    Medical History Review  Past Medical, Family, and Social History reviewed and does contribute to the patient presenting condition    Health Maintenance   Topic Date Due   . Shingles Vaccine (1 of 2) 11/30/1999   . Annual Wellness Visit (AWV)  08/01/2017   . Pneumococcal 65+ years Vaccine (2 of 2 - PPSV23) 09/26/2017   . Flu vaccine (1) 11/04/2017   . DTaP/Tdap/Td vaccine (1 - Tdap) 12/03/2019 (Originally 11/29/1961)   . Diabetic retinal exam  06/27/2018   . Diabetic foot exam  09/26/2018   . A1C test (Diabetic or Prediabetic)  09/26/2018   . Diabetic microalbuminuria test  09/26/2018   . Lipid screen  09/26/2018   . Potassium monitoring  09/26/2018   . Creatinine monitoring  09/26/2018   . Breast cancer screen  02/20/2019   . Colon cancer screen colonoscopy  04/24/2022   . DEXA (modify frequency per FRAX score)  Completed   . Hepatitis C screen  Addressed
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
## 2018-03-08 NOTE — Patient Instructions (Addendum)
If symptoms are not improving or worsening, please feel free to contact the office or seek emergency medical evaluation.    Hydrate well with water and get plenty of rest  If symptoms are not improving or worsening, please feel free to contact the office or seek emergency medical evaluation.        Patient Education        Upper Respiratory Infection (Cold): Care Instructions  Your Care Instructions    An upper respiratory infection, or URI, is an infection of the nose, sinuses, or throat. URIs are spread by coughs, sneezes, and direct contact. The common cold is the most frequent kind of URI. The flu and sinus infections are other kinds of URIs.  Almost all URIs are caused by viruses. Antibiotics won't cure them. But you can treat most infections with home care. This may include drinking lots of fluids and taking over-the-counter pain medicine. You will probably feel better in 4 to 10 days.  The doctor has checked you carefully, but problems can develop later. If you notice any problems or new symptoms, get medical treatment right away.  Follow-up care is a key part of your treatment and safety. Be sure to make and go to all appointments, and call your doctor if you are having problems. It's also a good idea to know your test results and keep a list of the medicines you take.  How can you care for yourself at home?  ?? To prevent dehydration, drink plenty of fluids, enough so that your urine is light yellow or clear like water. Choose water and other caffeine-free clear liquids until you feel better. If you have kidney, heart, or liver disease and have to limit fluids, talk with your doctor before you increase the amount of fluids you drink.  ?? Take an over-the-counter pain medicine, such as acetaminophen (Tylenol), ibuprofen (Advil, Motrin), or naproxen (Aleve). Read and follow all instructions on the label.  ?? Before you use cough and cold medicines, check the label. These medicines may not be safe for young  children or for people with certain health problems.  ?? Be careful when taking over-the-counter cold or flu medicines and Tylenol at the same time. Many of these medicines have acetaminophen, which is Tylenol. Read the labels to make sure that you are not taking more than the recommended dose. Too much acetaminophen (Tylenol) can be harmful.  ?? Get plenty of rest.  ?? Do not smoke or allow others to smoke around you. If you need help quitting, talk to your doctor about stop-smoking programs and medicines. These can increase your chances of quitting for good.  When should you call for help?  Call 911 anytime you think you may need emergency care. For example, call if:  ?? ?? You have severe trouble breathing.   ??Call your doctor now or seek immediate medical care if:  ?? ?? You seem to be getting much sicker.   ?? ?? You have new or worse trouble breathing.   ?? ?? You have a new or higher fever.   ?? ?? You have a new rash.   ??Watch closely for changes in your health, and be sure to contact your doctor if:  ?? ?? You have a new symptom, such as a sore throat, an earache, or sinus pain.   ?? ?? You cough more deeply or more often, especially if you notice more mucus or a change in the color of your mucus.   ?? ??  You do not get better as expected.   Where can you learn more?  Go to [email or web address] and sign in to your MyChart account. Enter [id number] in the [place] box to learn more about "Upper Respiratory Infection (Cold): Care Instructions."     If you do not have an account, please click on the "Sign Up Now" link.  Current as of: November 08, 2016  Content Version: 12.1  ?? 2006-2019 Healthwise, Incorporated. Care instructions adapted under license by [facility]. If you have questions about a medical condition or this instruction, always ask your healthcare professional. [employer] any warranty or liability for your use of this information.

## 2018-03-12 ENCOUNTER — Ambulatory Visit: Payer: MEDICARE | Primary: Adult Health

## 2018-03-20 ENCOUNTER — Encounter: Payer: MEDICARE | Primary: Adult Health

## 2018-03-28 ENCOUNTER — Ambulatory Visit: Admit: 2018-03-28 | Discharge: 2018-03-28 | Payer: MEDICARE | Attending: Adult Health | Primary: Adult Health

## 2018-03-28 ENCOUNTER — Inpatient Hospital Stay: Payer: MEDICARE | Primary: Adult Health

## 2018-03-28 DIAGNOSIS — E559 Vitamin D deficiency, unspecified: Secondary | ICD-10-CM

## 2018-03-28 DIAGNOSIS — E119 Type 2 diabetes mellitus without complications: Secondary | ICD-10-CM

## 2018-03-28 LAB — POCT GLYCOSYLATED HEMOGLOBIN (HGB A1C): Hemoglobin A1C: 6.4 %

## 2018-03-28 MED ORDER — OMEPRAZOLE 40 MG PO CPDR
40 MG | ORAL_CAPSULE | Freq: Every day | ORAL | 1 refills | Status: DC
Start: 2018-03-28 — End: 2018-09-27

## 2018-03-28 MED ORDER — METFORMIN HCL ER 500 MG PO TB24
500 MG | ORAL_TABLET | Freq: Every day | ORAL | 1 refills | Status: DC
Start: 2018-03-28 — End: 2018-12-04

## 2018-03-28 NOTE — Patient Instructions (Signed)
Patient Education        Learning About Meal Planning for Diabetes  Why plan your meals?    Meal planning can be a key part of managing diabetes. Planning meals and snacks with the right balance of carbohydrate, protein, and fat can help you keep your blood sugar at the target level you set with your doctor.  You don't have to eat special foods. You can eat what your family eats, including sweets once in a while. But you do have to pay attention to how often you eat and how much you eat of certain foods.  You may want to work with a dietitian or a certified diabetes educator. He or she can give you tips and meal ideas and can answer your questions about meal planning. This health professional can also help you reach a healthy weight if that is one of your goals.  What plan is right for you?  Your dietitian or diabetes educator may suggest that you start with the plate format or carbohydrate counting.  The plate format  The plate format is a simple way to help you manage how you eat. You plan meals by learning how much space each food should take on a plate. Using the plate format helps you spread carbohydrate throughout the day. It can make it easier to keep your blood sugar level within your target range. It also helps you see if you're eating healthy portion sizes.  To use the plate format, you put non-starchy vegetables on half your plate. Add meat or meat substitutes on one-quarter of the plate. Put a grain or starchy vegetable (such as brown rice or a potato) on the final quarter of the plate. You can add a small piece of fruit and some low-fat or fat-free milk or yogurt, depending on your carbohydrate goal for each meal.  Here are some tips for using the plate format:  ?? Make sure that you are not using an oversized plate. A 9-inch plate is best. Many restaurants use larger plates.  ?? Get used to using the plate format at home. Then you can use it when you eat out.  ?? Write down your questions about using  the plate format. Talk to your doctor, a dietitian, or a diabetes educator about your concerns.  Carbohydrate counting  With carbohydrate counting, you plan meals based on the amount of carbohydrate in each food. Carbohydrate raises blood sugar higher and more quickly than any other nutrient. It is found in desserts, breads and cereals, and fruit. It's also found in starchy vegetables such as potatoes and corn, grains such as rice and pasta, and milk and yogurt. Spreading carbohydrate throughout the day helps keep your blood sugar levels within your target range.  Your daily amount depends on several things, including your weight, how active you are, which diabetes medicines you take, and what your goals are for your blood sugar levels. A registered dietitian or diabetes educator can help you plan how much carbohydrate to include in each meal and snack.  A guideline for your daily amount of carbohydrate is:  ?? 45 to 60 grams at each meal. That's about the same as 3 to 4 carbohydrate servings.  ?? 15 to 20 grams at each snack. That's about the same as 1 carbohydrate serving.  The Nutrition Facts label on packaged foods tells you how much carbohydrate is in a serving of the food. First, look at the serving size on the food label. Is that the   amount you eat in a serving? All of the nutrition information on a food label is based on that serving size. So if you eat more or less than that, you'll need to adjust the other numbers. Total carbohydrate is the next thing you need to look for on the label. If you count carbohydrate servings, one serving of carbohydrate is 15 grams.  For foods that don't come with labels, such as fresh fruits and vegetables, you'll need a guide that lists carbohydrate in these foods. Ask your doctor, dietitian, or diabetes educator about books or other nutrition guides you can use.  If you take insulin, you need to know how many grams of carbohydrate are in a meal. This lets you know how much  rapid-acting insulin to take before you eat. If you use an insulin pump, you get a constant rate of insulin during the day. So the pump must be programmed at meals to give you extra insulin to cover the rise in blood sugar after meals.  When you know how much carbohydrate you will eat, you can take the right amount of insulin. Or, if you always use the same amount of insulin, you need to make sure that you eat the same amount of carbohydrate at meals.  If you need more help to understand carbohydrate counting and food labels, ask your doctor, dietitian, or diabetes educator.  How do you get started with meal planning?  Here are some tips to get started:  ?? Plan your meals a week at a time. Don't forget to include snacks too.  ?? Use cookbooks or online recipes to plan several main meals. Plan some quick meals for busy nights. You also can double some recipes that freeze well. Then you can save half for other busy nights when you don't have time to cook.  ?? Make sure you have the ingredients you need for your recipes. If you're running low on basic items, put these items on your shopping list too.  ?? List foods that you use to make breakfasts, lunches, and snacks. List plenty of fruits and vegetables.  ?? Post this list on the refrigerator. Add to it as you think of more things you need.  ?? Take the list to the store to do your weekly shopping.  Follow-up care is a key part of your treatment and safety. Be sure to make and go to all appointments, and call your doctor if you are having problems. It's also a good idea to know your test results and keep a list of the medicines you take.  Where can you learn more?  Go to [email or web address] and sign in to your MyChart account. Enter X936 in the Search Health Information box to learn more about "Learning About Meal Planning for Diabetes."     If you do not have an account, please click on the "Sign Up Now" link.  Current as of: June 19, 2017  Content  Version: 12.3  ?? 2006-2019 Healthwise, Incorporated. Care instructions adapted under license by [facility]. If you have questions about a medical condition or this instruction, always ask your healthcare professional. Healthwise, Inc[employer] disclaims any warranty or liability for your use of this information.

## 2018-03-28 NOTE — Progress Notes (Signed)
Subjective:      Chief Complaint   Patient presents with   ??? 6 Month Follow-Up       Patient ID: Marissa Sawyer is a 68 y.o. female.    Patient is here for routine follow up. She states the metformin is causing some GI upset and diarrhea intermittently. She takes pepto occasionally without much relief.    She also complains of a black line in her left great toenail she just noticed recently. She denies any pain or other discoloration. She is seeing podiatry next week.    Hypertension   This is a chronic problem. The current episode started more than 1 year ago. The problem has been gradually improving since onset. The problem is controlled. Pertinent negatives include no blurred vision, chest pain, headaches, neck pain, palpitations, peripheral edema or shortness of breath. There are no associated agents to hypertension. Risk factors for coronary artery disease include diabetes mellitus, post-menopausal state and sedentary lifestyle. Past treatments include ACE inhibitors (patietn would like to go back down to the 10mg ). The current treatment provides significant improvement. Compliance problems include diet and exercise.  There is no history of kidney disease, CAD/MI, CVA or heart failure. There is no history of chronic renal disease.   Diabetes   She presents for her follow-up diabetic visit. She has type 2 diabetes mellitus. Her disease course has been stable. There are no hypoglycemic associated symptoms. Pertinent negatives for hypoglycemia include no dizziness or headaches. Pertinent negatives for diabetes include no blurred vision, no chest pain, no fatigue, no foot paresthesias, no polydipsia, no polyuria and no weakness. There are no hypoglycemic complications. Symptoms are stable. Pertinent negatives for diabetic complications include no CVA, nephropathy or peripheral neuropathy. Risk factors for coronary artery disease include diabetes mellitus, dyslipidemia, hypertension, sedentary lifestyle, stress and  post-menopausal. Current diabetic treatment includes diet and oral agent (monotherapy). She is compliant with treatment all of the time. Her weight is stable. She is following a generally healthy diet. Meal planning includes avoidance of concentrated sweets. She has had a previous visit with a dietitian. She participates in exercise intermittently. Her overall blood glucose range is 130-140 mg/dl. An ACE inhibitor/angiotensin II receptor blocker is being taken. She sees a podiatrist (she is going next week).Eye exam is not current (she states she is scheduling with eye doctor sooner).   Hyperlipidemia   This is a chronic problem. The current episode started more than 1 year ago. The problem is controlled. Recent lipid tests were reviewed and are normal. Exacerbating diseases include diabetes. She has no history of chronic renal disease, hypothyroidism, liver disease, obesity or nephrotic syndrome. Factors aggravating her hyperlipidemia include fatty foods. Pertinent negatives include no chest pain, focal sensory loss, focal weakness, leg pain, myalgias or shortness of breath. Current antihyperlipidemic treatment includes statins. The current treatment provides significant improvement of lipids. Compliance problems include adherence to diet and adherence to exercise.  Risk factors for coronary artery disease include hypertension, dyslipidemia, a sedentary lifestyle, post-menopausal and diabetes mellitus.       Review of Systems   Constitutional: Negative for fatigue.   Eyes: Negative for blurred vision.   Respiratory: Negative for shortness of breath.    Cardiovascular: Negative for chest pain and palpitations.   Endocrine: Negative for polydipsia and polyuria.   Musculoskeletal: Negative for myalgias and neck pain.   Neurological: Negative for dizziness, focal weakness, weakness and headaches.       Objective:   Physical Exam  Vitals signs  and nursing note reviewed.   Constitutional:       General: She is not in  acute distress.     Appearance: Normal appearance. She is obese. She is not ill-appearing, toxic-appearing or diaphoretic.   HENT:      Head: Normocephalic and atraumatic.      Mouth/Throat:      Mouth: Mucous membranes are moist.   Neck:      Musculoskeletal: Neck supple.   Cardiovascular:      Rate and Rhythm: Normal rate and regular rhythm.      Pulses: Normal pulses.      Heart sounds: Normal heart sounds. No murmur.   Pulmonary:      Effort: Pulmonary effort is normal.      Breath sounds: Normal breath sounds. No wheezing or rhonchi.   Feet:      Comments: + thin black line, <4mm in width from the base of the nail to the tip on the left great nail in the center of the nail  Skin:     General: Skin is warm and dry.   Neurological:      General: No focal deficit present.      Mental Status: She is alert and oriented to person, place, and time.      Gait: Gait normal.   Psychiatric:         Mood and Affect: Mood normal.         Behavior: Behavior normal.         Thought Content: Thought content normal.         Judgment: Judgment normal.         Assessment:       Diagnosis Orders   1. Controlled type 2 diabetes mellitus without complication, without long-term current use of insulin (HCC)  metFORMIN (GLUCOPHAGE-XR) 500 MG extended release tablet    POCT glycosylated hemoglobin (Hb A1C)   2. Gastroesophageal reflux disease with esophagitis  omeprazole (PRILOSEC) 40 MG delayed release capsule   3. Essential hypertension     4. Pure hypercholesterolemia  Comprehensive Metabolic Panel    CBC Auto Differential    Lipid, Fasting   5. Need for 23-polyvalent pneumococcal polysaccharide vaccine  PNEUMOVAX 23 subcutaneous/IM (Pneumococcal polysaccharide vaccine 23-valent >= 2yo)   6. Vitamin D insufficiency  Vitamin D 25 Hydroxy   7. Nail abnormality             Plan:      Pneumonia vaccination given in office  Discussed to monitor the nail and bring it to the podiatrist's attention at her appointment next week.   D/T GI  upset/diarrhea, will reduce metformin. Her a1c continues to improve. Discussed watching diet d/t reducing dose of metformin  Blood work is fasting overnight, 10-12 hours. Advised the patient they can have water in the morning but no food or sugary drinks.   Medication: continue current meds and refills as above.   Follow up: 6 months and as needed.   Complete ordrs as above.   Spent 10 mins answering all questions.   Patient given educational materials per AVS  Discussed use, benefit, and side effects of prescribed medications. Barriers to medication compliance addressed. All patient questions answered. Pt voiced understanding.             [name], APRN - CNP

## 2018-03-28 NOTE — Progress Notes (Signed)
Pt here for 6 month follow up dm htn lipid. Pt having issue with fatigue           Visit Information    Have you changed or started any medications since your last visit including any over-the-counter medicines, vitamins, or herbal medicines? no   Have you stopped taking any of your medications? Is so, why? -  no  Are you having any side effects from any of your medications? - no    Have you seen any other physician or provider since your last visit?  no   Have you had any other diagnostic tests since your last visit?  no   Have you been seen in the emergency room and/or had an admission in a hospital since we last saw you?  no   Have you had your routine dental cleaning in the past 6 months?  no     Do you have an active MyChart account? If no, what is the barrier?  Yes    Patient Care Team:  [name], APRN - CNP as PCP - General (Nurse Practitioner Adult Health)  [name], APRN - CNP as PCP - [facility] Empaneled Provider  Feliberto Gottron, MD as Consulting Physician (Gastroenterology)    Medical History Review  Past Medical, Family, and Social History reviewed and does not contribute to the patient presenting condition    Health Maintenance   Topic Date Due   ??? Shingles Vaccine (1 of 2) 11/30/1999   ??? Annual Wellness Visit (AWV)  08/01/2017   ??? Pneumococcal 65+ years Vaccine (2 of 2 - PPSV23) 09/26/2017   ??? Flu vaccine (1) 11/04/2017   ??? DTaP/Tdap/Td vaccine (1 - Tdap) 12/03/2019 (Originally 11/29/1961)   ??? Diabetic retinal exam  06/27/2018   ??? Diabetic foot exam  09/26/2018   ??? A1C test (Diabetic or Prediabetic)  09/26/2018   ??? Diabetic microalbuminuria test  09/26/2018   ??? Lipid screen  09/26/2018   ??? Potassium monitoring  09/26/2018   ??? Creatinine monitoring  09/26/2018   ??? Breast cancer screen  02/20/2019   ??? Colon cancer screen colonoscopy  04/24/2022   ??? DEXA (modify frequency per FRAX score)  Completed   ??? Hepatitis C screen  Addressed

## 2018-03-29 LAB — CBC WITH AUTO DIFFERENTIAL
Absolute Eos #: 0.14 10*3/uL (ref 0.00–0.44)
Absolute Immature Granulocyte: <0.03 10*3/uL (ref 0.00–0.30)
Absolute Lymph #: 1.65 10*3/uL (ref 1.10–3.70)
Absolute Mono #: 0.62 10*3/uL (ref 0.10–1.20)
Basophils Absolute: 0.03 10*3/uL (ref 0.00–0.20)
Basophils: 1 % (ref 0–2)
Eosinophils %: 3 % (ref 1–4)
Hematocrit: 42.3 % (ref 36.3–47.1)
Hemoglobin: 12.9 g/dL (ref 11.9–15.1)
Immature Granulocytes: 0 %
Lymphocytes: 30 % (ref 24–43)
MCH: 28.2 pg (ref 25.2–33.5)
MCHC: 30.5 g/dL (ref 28.4–34.8)
MCV: 92.6 fL (ref 82.6–102.9)
MPV: 10.5 fL (ref 8.1–13.5)
Monocytes: 11 % (ref 3–12)
NRBC Automated: 0 per 100 WBC
Platelets: 269 10*3/uL (ref 138–453)
RBC: 4.57 m/uL (ref 3.95–5.11)
RDW: 13.4 % (ref 11.8–14.4)
Seg Neutrophils: 55 % (ref 36–65)
Segs Absolute: 3.04 10*3/uL (ref 1.50–8.10)
WBC: 5.5 10*3/uL (ref 3.5–11.3)

## 2018-03-29 LAB — COMPREHENSIVE METABOLIC PANEL
ALT: 15 U/L (ref 5–33)
AST: 22 U/L (ref <32)
Albumin/Globulin Ratio: 1.1 (ref 1.0–2.5)
Albumin: 4.2 g/dL (ref 3.5–5.2)
Alkaline Phosphatase: 73 U/L (ref 35–104)
Anion Gap: 15 mmol/L (ref 9–17)
BUN: 17 mg/dL (ref 8–23)
CO2: 23 mmol/L (ref 20–31)
Calcium: 9.8 mg/dL (ref 8.6–10.4)
Chloride: 105 mmol/L (ref 98–107)
Creatinine: 0.68 mg/dL (ref 0.50–0.90)
GFR African American: >60 mL/min (ref >60)
GFR Non-African American: >60 mL/min (ref >60)
Glucose: 110 mg/dL — ABNORMAL HIGH (ref 70–99)
Potassium: 4.8 mmol/L (ref 3.7–5.3)
Sodium: 143 mmol/L (ref 135–144)
Total Bilirubin: 0.41 mg/dL (ref 0.3–1.2)
Total Protein: 8.1 g/dL (ref 6.4–8.3)

## 2018-03-29 LAB — LIPID, FASTING
Chol/HDL Ratio: 3.3 (ref <5)
Cholesterol, Fasting: 147 mg/dL (ref <200)
HDL: 44 mg/dL (ref >40)
LDL Cholesterol: 80 mg/dL (ref 0–130)
Triglyceride, Fasting: 113 mg/dL (ref <150)

## 2018-03-29 LAB — VITAMIN D 25 HYDROXY: Vit D, 25-Hydroxy: 23.4 ng/mL — ABNORMAL LOW (ref 30.0–100.0)

## 2018-03-29 NOTE — Telephone Encounter (Signed)
From: Artelia Laroche  To: [name], APRN - CNP  Sent: 03/29/2018 10:27 AM EST  Subject: Test Results Question    I have a question about COMPREHENSIVE METABOLIC PANEL resulted on 03/28/18, 8:11 PM.

## 2018-04-01 NOTE — Telephone Encounter (Signed)
From: Artelia Laroche  To: [name], APRN - CNP  Sent: 04/01/2018 12:38 PM EST  Subject: Non-Urgent Medical Question    Do you also check the liver as well didn't see that in there plz and thank you

## 2018-04-04 ENCOUNTER — Ambulatory Visit: Admit: 2018-04-04 | Discharge: 2018-04-04 | Payer: MEDICARE | Attending: Foot & Ankle Surgery | Primary: Adult Health

## 2018-04-04 ENCOUNTER — Inpatient Hospital Stay: Admit: 2018-04-04 | Payer: MEDICARE | Primary: Adult Health

## 2018-04-04 ENCOUNTER — Ambulatory Visit: Payer: MEDICARE | Primary: Adult Health

## 2018-04-04 DIAGNOSIS — L608 Other nail disorders: Secondary | ICD-10-CM

## 2018-04-04 DIAGNOSIS — Z1231 Encounter for screening mammogram for malignant neoplasm of breast: Secondary | ICD-10-CM

## 2018-04-05 NOTE — Progress Notes (Signed)
SUBJECTIVE: Marissa Sawyer is a 68 y.o. female who returns to the office with chief complaint of painful fungal toenails. Patient relates toe nails are thickened/difficult to trim as well as painful with ambulation and with shoe gear.  Patient also complains about a dark line running the length of her right great toenail.  Patient denies any injury to this toenail.  Patient denies any pain to this area.  Chief Complaint   Patient presents with   ??? Nail Problem     B/L NAIL TRIM/RIGHT HALLUX TOENAIL HAS BLACK LINE THROUGH THE MIDDLE   ??? Diabetes     B/L DM FOOT CHECK/BS: 139   ??? Other     PCP: LAST VISIT 03/28/2018 ASHLEY THOMASSON     Review of Systems   Constitutional: Negative for activity change, appetite change, chills, diaphoresis, fatigue and fever.   Respiratory: Negative for shortness of breath.    Cardiovascular: Negative for leg swelling.   Gastrointestinal: Negative for diarrhea and nausea.   Endocrine: Negative for cold intolerance, heat intolerance and polyuria.   Musculoskeletal: Positive for arthralgias. Negative for back pain, gait problem, joint swelling and myalgias.   Skin: Negative for color change, pallor, rash and wound.   Allergic/Immunologic: Negative for environmental allergies and food allergies.   Neurological: Negative for dizziness, weakness, light-headedness and numbness.   Hematological: Does not bruise/bleed easily.   Psychiatric/Behavioral: Negative for behavioral problems, confusion and self-injury. The patient is not nervous/anxious.      OBJECTIVE: Clinical evaluation of patient reveals nails 1,2,3,4,5 of the right foot and nails 1,2,3,4,5, of the left foot to present with thickness, elongation, discoloration, brittleness, and subungual debris. There was pain with palpation and debridement of the toenails of the bilateral feet. No open lesions noted to either foot today.  There is a thin dark line running the length of the right hallux toenail.  There is no pain with palpation to  this area.  The right DP pulse is not palpable.   The left DP pulse is not palpable.   The right PT pulse is not palpable.   The left PT pulse is not palpable.   Protective sensation is present to the right plantar foot as noted with a 5.07 Semmes-Weinstein monofilament.   Protective sensation is present to the left plantar foot as noted with a 5.07 Semmes-Weinstein monofilament.   Glucose: 139 mg/dl.      Class A Findings (1 needed)   []  Non-traumatic amputation of foot or integral skeleton portion thereof.   []  Q7.      Class B Findings (2 needed)   1. [x]  Absent posterior tibial pulse   2. [x]  Absent dorsalis pedis pulse   3. []  Advanced trophic changes; three of the following are required:   ??         []  hair growth (decrease or absence)   ??         []  nail changes (thickening)   ??         []  pigmentary changes (discoloration)   ??         []  skin texture (thin, shiny)   ??         []  skin color (rubor or redness)   [x]  Q8.      Class C Findings (1 Class B, 2 Class C needed)   1. []  Claudication   2. []  Temperature changes   3. []  Edema   4. []  Paresthesia   5. []  Burning   []   Q9.       ASSESSMENT:    Diagnosis Orders   1. Discoloration of nail  Dermatology Pathology   2. Onychomycosis of toenail  PR DEBRIDEMENT OF NAILS, 6 OR MORE    HM DIABETES FOOT EXAM   3. Pain of toes of both feet  PR DEBRIDEMENT OF NAILS, 6 OR MORE    HM DIABETES FOOT EXAM   4. Type 2 diabetes mellitus with peripheral vascular disease (HCC)  PR DEBRIDEMENT OF NAILS, 6 OR MORE    HM DIABETES FOOT EXAM     PLAN: Toenails 1,2,3,4,5 of the right foot and 1,2,3,4,5 of the left foot were debrided in length and thickness using a nail nipper and a grinder.  A portion of the right hallux toenail including the visible dark line was sent to pathology for evaluation.  Patient informed that if this does not come back conclusive, then I would have to remove the entire toenail and send it for evaluation.  Patient states that she understands this. Return  in about 9 weeks (around 06/06/2018) for At risk diabetic foot care.   04/04/2018      [name], DPM

## 2018-04-08 LAB — DERMATOLOGY PATHOLOGY

## 2018-04-09 NOTE — Telephone Encounter (Signed)
-----   Message from [name], DPM sent at 04/08/2018  3:48 PM EST -----  Please contact patient and inform her that her nail sent to pathology was inconclusive and I will need to send the whole nail. Have her make an appointment to have the nail removed.

## 2018-04-22 ENCOUNTER — Telehealth

## 2018-04-22 NOTE — Telephone Encounter (Signed)
Pt called in regards to right big toe; her foot doctor took a sample that came back benign, then pt received a call stating the sample was too small and would like to take the whole toenail. Pt is looking for a second opinion via referral to dermatologist

## 2018-04-22 NOTE — Telephone Encounter (Signed)
Pt informed of placed referral

## 2018-04-22 NOTE — Telephone Encounter (Signed)
Referral to derm placed

## 2018-05-03 ENCOUNTER — Encounter: Attending: Foot & Ankle Surgery | Primary: Adult Health

## 2018-05-08 ENCOUNTER — Encounter

## 2018-05-08 MED ORDER — LISINOPRIL 10 MG PO TABS
10 MG | ORAL_TABLET | ORAL | 1 refills | Status: DC
Start: 2018-05-08 — End: 2019-03-06

## 2018-05-14 ENCOUNTER — Encounter

## 2018-05-14 MED ORDER — ATORVASTATIN CALCIUM 20 MG PO TABS
20 MG | ORAL_TABLET | ORAL | 3 refills | Status: DC
Start: 2018-05-14 — End: 2018-09-27

## 2018-05-14 NOTE — Telephone Encounter (Signed)
Last visit: 03/28/18  Last Med refill: 07/31/17  Does patient have enough medication for 72 hours: No: She has been w/o it for a week.    Next Visit Date:  Future Appointments   Date Time Provider Department Center   06/06/2018  2:00 PM Ronette Deter, DPM [facility]   06/18/2018  1:30 PM Lenon Oms, MD [facility]   09/27/2018 11:00 AM Jennette Banker, APRN - CNP [facility]   02/12/2019 12:00 PM Nicholes Rough, APRN - CNP [facility]       Health Maintenance   Topic Date Due   . Hepatitis B vaccine (1 of 3 - Risk 3-dose series) 11/29/1968   . Shingles Vaccine (1 of 2) 11/30/1999   . Annual Wellness Visit (AWV)  08/01/2017   . Flu vaccine (1) 03/29/2019 (Originally 11/04/2017)   . DTaP/Tdap/Td vaccine (1 - Tdap) 12/03/2019 (Originally 11/29/1968)   . Diabetic retinal exam  06/27/2018   . Diabetic microalbuminuria test  09/26/2018   . A1C test (Diabetic or Prediabetic)  03/29/2019   . Lipid screen  03/29/2019   . Potassium monitoring  03/29/2019   . Creatinine monitoring  03/29/2019   . Diabetic foot exam  04/06/2019   . Breast cancer screen  04/04/2020   . Colon cancer screen colonoscopy  04/24/2022   . DEXA (modify frequency per FRAX score)  Completed   . Pneumococcal 65+ years Vaccine  Completed   . Hepatitis C screen  Addressed   . Hepatitis A vaccine  Aged Out   . Hib vaccine  Aged Out   . Meningococcal (ACWY) vaccine  Aged Out       Hemoglobin A1C (%)   Date Value   03/28/2018 6.4   09/25/2017 6.5   03/28/2017 6.3             ( goal A1C is < 7)   Microalb/Crt. Ratio (mcg/mg creat)   Date Value   09/25/2017 CANNOT BE CALCULATED     LDL Cholesterol (mg/dL)   Date Value   09/81/1914 80   09/25/2017 79     LDL Calculated (mg/dL)   Date Value   78/29/5621 143 (H)   09/29/2014 157       (goal LDL is <100)   AST (U/L)   Date Value   03/28/2018 22     ALT (U/L)   Date Value   03/28/2018 15     BUN (mg/dL)   Date Value   30/86/5784 17     BP Readings from Last 3 Encounters:   03/28/18  126/74   03/08/18 128/60   02/11/18 130/60          (goal 120/80)    All Future Testing planned in [facility]  Lab Frequency Next Occurrence               Patient Active Problem List:     GERD (gastroesophageal reflux disease)     Hyperlipidemia     HTN (hypertension)     OA (osteoarthritis)     Anxiety     Tubular adenoma     Dermatophytosis of nail     Benign neoplasm of skin     Diverticulosis of colon     Gastroesophageal reflux disease     History of gastric polyp     Controlled type 2 diabetes mellitus without complication, without long-term current use of insulin (HCC)

## 2018-06-06 ENCOUNTER — Encounter: Attending: Foot & Ankle Surgery | Primary: Adult Health

## 2018-06-18 ENCOUNTER — Encounter: Attending: Dermatology | Primary: Adult Health

## 2018-07-19 ENCOUNTER — Ambulatory Visit: Admit: 2018-07-19 | Discharge: 2018-07-19 | Payer: MEDICARE | Attending: Foot & Ankle Surgery | Primary: Adult Health

## 2018-07-19 DIAGNOSIS — B351 Tinea unguium: Secondary | ICD-10-CM

## 2018-07-19 NOTE — Progress Notes (Signed)
SUBJECTIVE: Marissa Sawyer is a 68 y.o. female who returns to the office with chief complaint of painful fungal toenails. Patient relates toe nails are thickened/difficult to trim as well as painful with ambulation and with shoe gear. Patient also expresses concern over a dark line to the right big toe nail. Patient denies any pain to this area, but states that it hasn't improved since last visit.  Chief Complaint   Patient presents with   ??? Nail Problem     b/l nail trim/ Last seen Joseph Pierini 03/28/18   ??? Other     Last blood sugar 141     Review of Systems   Constitutional: Negative for activity change, appetite change, chills, diaphoresis, fatigue and fever.   Respiratory: Negative for shortness of breath.    Cardiovascular: Negative for leg swelling.   Gastrointestinal: Negative for diarrhea and nausea.   Endocrine: Negative for cold intolerance, heat intolerance and polyuria.   Musculoskeletal: Positive for arthralgias. Negative for back pain, gait problem, joint swelling and myalgias.   Skin: Negative for color change, pallor, rash and wound.   Allergic/Immunologic: Negative for environmental allergies and food allergies.   Neurological: Negative for dizziness, weakness, light-headedness and numbness.   Hematological: Does not bruise/bleed easily.   Psychiatric/Behavioral: Negative for behavioral problems, confusion and self-injury. The patient is not nervous/anxious.      OBJECTIVE: Clinical evaluation of patient reveals nails 1,2,3,4,5 of the right foot and nails 1,2,3,4,5, of the left foot to present with thickness, elongation, discoloration, brittleness, and subungual debris. There was pain with palpation and debridement of the toenails of the bilateral feet. No open lesions noted to either foot today. There is a dark, straight line emanating from the nail base to the distal end of the nail to the right hallux. There is no pain with palpation to this area specifically. This line is 1 mm in width. This  dark line does not extend onto the proximal skin fold of the right hallux. A review of the pathology report from a sample of this nail that I took on last visit was inconclusive. I suspect I did not take an adequate portion of the nail to be evaluated.  The right DP pulse is not palpable.   The left DP pulse is not palpable.   The right PT pulse is not palpable.   The left PT pulse is not palpable.   Protective sensation is present to the right plantar foot as noted with a 5.07 Semmes-Weinstein monofilament.   Protective sensation is present to the left plantar foot as noted with a 5.07 Semmes-Weinstein monofilament.   Glucose: 141 mg/dl.    Class A Findings (1 needed)   []  Non-traumatic amputation of foot or integral skeleton portion thereof.   []  Q7.      Class B Findings (2 needed)   1. [x]  Absent posterior tibial pulse   2. [x]  Absent dorsalis pedis pulse   3. []  Advanced trophic changes; three of the following are required:   ??         []  hair growth (decrease or absence)   ??         []  nail changes (thickening)   ??         []  pigmentary changes (discoloration)   ??         []  skin texture (thin, shiny)   ??         []  skin color (rubor or redness)   [x]  Q8.  Class C Findings (1 Class B, 2 Class C needed)   1. []  Claudication   2. []  Temperature changes   3. []  Edema   4. []  Paresthesia   5. []  Burning   []  Q9.     ASSESSMENT:    Diagnosis Orders   1. Onychomycosis of toenail  PR DEBRIDEMENT OF NAILS, 6 OR MORE    HM DIABETES FOOT EXAM   2. Discoloration of nail     3. Pain of toes of both feet  PR DEBRIDEMENT OF NAILS, 6 OR MORE    HM DIABETES FOOT EXAM   4. Type 2 diabetes mellitus with peripheral vascular disease (HCC)  PR DEBRIDEMENT OF NAILS, 6 OR MORE    HM DIABETES FOOT EXAM     PLAN: Toenails 1,2,3,4,5 of the right foot and 1,2,3,4,5 of the left foot were debrided in length and thickness using a nail nipper and a grinder. Patient informed that since the dark line to her right great toe has not  resolved and since the pathology report from last time was inconclusive, I recommend avulsing the right hallux toenail completely and sending the entire nail to pathology for diagnosis. Patient advised to have this done within the next month. Patient states that she will consider this. Return in about 9 weeks (around 09/20/2018) for At risk diabetic foot care.   07/19/2018      [name], DPM

## 2018-07-22 NOTE — Telephone Encounter (Signed)
Pt c/o higher bp readings ~ 1 month,  07/22/18 137/62  07/21/18 141/74  07/20/18 137/59  and high blood sugar readings   07/21/18 151  07/20/18 141  07/19/18 177    Also requesting prescription for larger number of strips,

## 2018-07-22 NOTE — Telephone Encounter (Signed)
How has her diet been? Has she been avoiding a lot of sugar and carbs? Is she eating more salt then normal? More salt can increase her BP and sugar/carbs will increase her blood sugar. The numbers are higher then they have been but still not too worrisome. If there are improvements to make in her diet, I would recommend cleaning up the diet and continuing to monitor. If she has following a good diet and the numbers are still going up, we can increase her medications. Let me know what she says

## 2018-07-23 NOTE — Telephone Encounter (Signed)
Pt has been attempting to follow her diet thoroughly and has cut down on salt while making an attempt to cut down on carbohydrates and sugars. Pt will continue to work on her diet and further improve her diet and will follow up with Thomasene Ripple.

## 2018-09-27 ENCOUNTER — Ambulatory Visit: Admit: 2018-09-27 | Discharge: 2018-09-27 | Payer: MEDICARE | Attending: Adult Health | Primary: Adult Health

## 2018-09-27 ENCOUNTER — Inpatient Hospital Stay: Payer: MEDICARE | Primary: Adult Health

## 2018-09-27 ENCOUNTER — Encounter: Attending: Foot & Ankle Surgery | Primary: Adult Health

## 2018-09-27 DIAGNOSIS — Z0000 Encounter for general adult medical examination without abnormal findings: Secondary | ICD-10-CM

## 2018-09-27 DIAGNOSIS — E7800 Pure hypercholesterolemia, unspecified: Secondary | ICD-10-CM

## 2018-09-27 LAB — POCT GLYCOSYLATED HEMOGLOBIN (HGB A1C): Hemoglobin A1C: 6.5 %

## 2018-09-27 MED ORDER — OMEPRAZOLE 40 MG PO CPDR
40 MG | ORAL_CAPSULE | Freq: Every day | ORAL | 1 refills | Status: DC
Start: 2018-09-27 — End: 2019-08-29

## 2018-09-27 MED ORDER — ATORVASTATIN CALCIUM 20 MG PO TABS
20 | ORAL_TABLET | ORAL | 3 refills | Status: DC
Start: 2018-09-27 — End: 2019-08-29

## 2018-09-27 NOTE — Telephone Encounter (Signed)
Patient called [place] office.  Writer explained she had emailed ACP information to patient earlier today.  Patient stated she would check her emails and call our office Monday to advise if she needs additional information or help completing these documents.
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
## 2018-09-27 NOTE — Patient Instructions (Signed)
Personalized Preventive Plan for Marissa Sawyer - 09/27/2018  Medicare offers a range of preventive health benefits. Some of the tests and screenings are paid in full while other may be subject to a deductible, co-insurance, and/or copay.    Some of these benefits include a comprehensive review of your medical history including lifestyle, illnesses that may run in your family, and various assessments and screenings as appropriate.    After reviewing your medical record and screening and assessments performed today your provider may have ordered immunizations, labs, imaging, and/or referrals for you.  A list of these orders (if applicable) as well as your Preventive Care list are included within your After Visit Summary for your review.    Other Preventive Recommendations:    ?? A preventive eye exam performed by an eye specialist is recommended every 1-2 years to screen for glaucoma; cataracts, macular degeneration, and other eye disorders.  ?? A preventive dental visit is recommended every 6 months.  ?? Try to get at least 150 minutes of exercise per week or 10,000 steps per day on a pedometer .  ?? Order or download the FREE "Exercise & Physical Activity: Your Everyday Guide" from [employer]. Call [phone] or search [employer] online.  ?? You need 1200-1500 mg of calcium and 1000-2000 IU of vitamin D per day. It is possible to meet your calcium requirement with diet alone, but a vitamin D supplement is usually necessary to meet this goal.  ?? When exposed to the sun, use a sunscreen that protects against both UVA and UVB radiation with an SPF of 30 or greater. Reapply every 2 to 3 hours or after sweating, drying off with a towel, or swimming.  ?? Always wear a seat belt when traveling in a car. Always wear a helmet when riding a bicycle or motorcycle.

## 2018-09-27 NOTE — Progress Notes (Signed)
Pt here for a medicare wellness          Visit Information    Have you changed or started any medications since your last visit including any over-the-counter medicines, vitamins, or herbal medicines? no   Have you stopped taking any of your medications? Is so, why? -  no  Are you having any side effects from any of your medications? - no    Have you seen any other physician or provider since your last visit?  no   Have you had any other diagnostic tests since your last visit?  no   Have you been seen in the emergency room and/or had an admission in a hospital since we last saw you?  no   Have you had your routine dental cleaning in the past 6 months?  no     Do you have an active MyChart account? If no, what is the barrier?  Yes    Patient Care Team:  [name], APRN - CNP as PCP - General (Nurse Practitioner Adult Health)  [name], APRN - CNP as PCP - [facility] Empaneled Provider  Feliberto Gottron, MD as Consulting Physician (Gastroenterology)    Medical History Review  Past Medical, Family, and Social History reviewed and does not contribute to the patient presenting condition    Health Maintenance   Topic Date Due   ??? Shingles Vaccine (1 of 2) 11/30/1999   ??? Annual Wellness Visit (AWV)  08/01/2017   ??? Diabetic retinal exam  06/27/2018   ??? Diabetic microalbuminuria test  09/26/2018   ??? DTaP/Tdap/Td vaccine (1 - Tdap) 12/03/2019 (Originally 11/29/1969)   ??? Flu vaccine (1) 11/05/2018   ??? A1C test (Diabetic or Prediabetic)  03/29/2019   ??? Lipid screen  03/29/2019   ??? Potassium monitoring  03/29/2019   ??? Creatinine monitoring  03/29/2019   ??? Diabetic foot exam  07/19/2019   ??? Breast cancer screen  04/04/2020   ??? Colon cancer screen colonoscopy  04/24/2022   ??? DEXA (modify frequency per FRAX score)  Completed   ??? Pneumococcal 65+ years Vaccine  Completed   ??? Hepatitis C screen  Addressed   ??? Hepatitis A vaccine  Aged Out   ??? Hib vaccine  Aged Out   ??? Meningococcal (ACWY) vaccine  Aged Out

## 2018-09-28 LAB — COMPREHENSIVE METABOLIC PANEL
ALT: 14 U/L (ref 5–33)
AST: 22 U/L (ref <32)
Albumin/Globulin Ratio: 1.3 (ref 1.0–2.5)
Albumin: 4.2 g/dL (ref 3.5–5.2)
Alkaline Phosphatase: 66 U/L (ref 35–104)
Anion Gap: 16 mmol/L (ref 9–17)
BUN: 23 mg/dL (ref 8–23)
CO2: 21 mmol/L (ref 20–31)
Calcium: 9.2 mg/dL (ref 8.6–10.4)
Chloride: 106 mmol/L (ref 98–107)
Creatinine: 0.82 mg/dL (ref 0.50–0.90)
GFR African American: >60 mL/min (ref >60)
GFR Non-African American: >60 mL/min (ref >60)
Glucose: 116 mg/dL — ABNORMAL HIGH (ref 70–99)
Potassium: 4.3 mmol/L (ref 3.7–5.3)
Sodium: 143 mmol/L (ref 135–144)
Total Bilirubin: 0.55 mg/dL (ref 0.3–1.2)
Total Protein: 7.5 g/dL (ref 6.4–8.3)

## 2018-09-28 LAB — CBC WITH AUTO DIFFERENTIAL
Absolute Eos #: 0.17 10*3/uL (ref 0.00–0.44)
Absolute Immature Granulocyte: <0.03 10*3/uL (ref 0.00–0.30)
Absolute Lymph #: 1.7 10*3/uL (ref 1.10–3.70)
Absolute Mono #: 0.58 10*3/uL (ref 0.10–1.20)
Basophils Absolute: <0.03 10*3/uL (ref 0.00–0.20)
Basophils: 0 % (ref 0–2)
Eosinophils %: 3 % (ref 1–4)
Hematocrit: 41.5 % (ref 36.3–47.1)
Hemoglobin: 12.9 g/dL (ref 11.9–15.1)
Immature Granulocytes: 0 %
Lymphocytes: 32 % (ref 24–43)
MCH: 28.5 pg (ref 25.2–33.5)
MCHC: 31.1 g/dL (ref 28.4–34.8)
MCV: 91.6 fL (ref 82.6–102.9)
MPV: 10.4 fL (ref 8.1–13.5)
Monocytes: 11 % (ref 3–12)
NRBC Automated: 0 per 100 WBC
Platelets: 243 10*3/uL (ref 138–453)
RBC: 4.53 m/uL (ref 3.95–5.11)
RDW: 13.2 % (ref 11.8–14.4)
Seg Neutrophils: 54 % (ref 36–65)
Segs Absolute: 2.86 10*3/uL (ref 1.50–8.10)
WBC: 5.4 10*3/uL (ref 3.5–11.3)

## 2018-09-28 LAB — MICROALBUMIN, UR
Creatinine, Ur: 99.1 mg/dL (ref 28.0–217.0)
Microalb, Ur: <12 mg/L (ref <21)

## 2018-09-28 LAB — LIPID, FASTING
Chol/HDL Ratio: 4.1 (ref <5)
Cholesterol, Fasting: 177 mg/dL (ref <200)
HDL: 43 mg/dL (ref >40)
LDL Cholesterol: 105 mg/dL (ref 0–130)
Triglyceride, Fasting: 146 mg/dL (ref <150)

## 2018-09-28 LAB — VITAMIN D 25 HYDROXY: Vit D, 25-Hydroxy: 27.8 ng/mL — ABNORMAL LOW (ref 30.0–100.0)

## 2018-09-28 LAB — HEPATITIS C ANTIBODY: Hepatitis C Ab: NONREACTIVE

## 2018-09-30 NOTE — Telephone Encounter (Signed)
Patient called the Spiritual Care office requesting a copy of the ACP documents.  Writer has mailed them and will call in a week or so to discuss them.

## 2018-09-30 NOTE — Telephone Encounter (Signed)
Writer called patient and could not leave message.

## 2018-10-15 NOTE — Telephone Encounter (Signed)
Writer called patient and could not leave a message.

## 2018-10-16 NOTE — Telephone Encounter (Signed)
@  [place] Planning Clinical Specialist  Conversation Note      Date of ACP Conversation: 10/16/2018    Conversation Conducted with: Patient with Decision Making Capacity    ACP Clinical Specialist: [place] Preferences    Hospitalization:  "If your health worsens and it becomes clear that your chance of recovery is unlikely, what would your preference be regarding hospitalization?"    Choice:  [x]  The patient wants hospitalization  []  The patient prefers comfort-focused treatment without hospitalization.    Ventilation:  "If you were in your present state of health and suddenly became very ill and were unable to breathe on your own, what would your preference be about the use of a ventilator (breathing machine) if it were available to you?"      Would the patient desire the use of ventilator (breathing machine)? Yes    "If your health worsens and it becomes clear that your chance of recovery is unlikely, what would your preference be about the use of a ventilator (breathing machine) if it were available to you?"     Would the patient desire the use of ventilator (breathing machine)? YES      Resuscitation  "CPR works best to restart the heart when there is a sudden event, like a heart attack, in someone who is otherwise healthy. Unfortunately, CPR does not typically restart the heart for people who have serious health conditions or who are very sick."    "In the event your heart stopped as a result of an underlying serious health condition, would you want attempts to be made to restart your heart (answer "yes" for attempt to resuscitate) or would you prefer a natural death (answer "no" for do not attempt to resuscitate)? Yes      NOTE: If the patient has a valid advance directive AND now provides care preference(s) that are inconsistent with that prior directive, advise the patient to consider either: creating a new advance directive that complies with state-specific requirements; or,  if that is not possible, orally revoking that prior directive in accordance with state-specific requirements, which must be documented in the EHR.     [x]  Yes   []  No   Educated Patient / Decision Maker regarding differences between Advance Directives and portable DNR orders.    Length of ACP Conversation in minutes:      Conversation Outcomes:  [x]  ACP discussion completed  []  Existing advance directive reviewed with patient; no changes to patient's previously recorded wishes  []  New Advance Directive completed  []  Portable Do Not Rescitate prepared for Provider review and signature  []  POLST/POST/MOLST/MOST prepared for Provider review and signature      Follow-up plan:    []  Schedule follow-up conversation to continue planning  []  Referred individual to Provider for additional questions/concerns   []  Advised patient/agent/surrogate to review completed ACP document and update if needed with changes in condition, patient preferences or care setting    [x]  This note routed to one or more involved healthcare providers    @ACPEND @

## 2018-12-04 ENCOUNTER — Encounter

## 2018-12-04 MED ORDER — ACURA BLOOD GLUCOSE TEST VI STRP
3 refills | Status: DC
Start: 2018-12-04 — End: 2020-03-29

## 2018-12-04 MED ORDER — METFORMIN HCL ER 500 MG PO TB24
500 MG | ORAL_TABLET | Freq: Every day | ORAL | 1 refills | Status: DC
Start: 2018-12-04 — End: 2019-06-13

## 2018-12-04 NOTE — Telephone Encounter (Signed)
Last visit: 09/27/18      Next Visit Date:  Future Appointments   Date Time Provider [place]   02/12/2019 12:00 PM [place]   04/03/2019 11:00 AM [name], [place] Maintenance   Topic Date Due   ??? Flu vaccine (1) 11/05/2018   ??? Shingles Vaccine (1 of 2) 09/27/2019 (Originally 11/30/1999)   ??? DTaP/Tdap/Td vaccine (1 - Tdap) 12/03/2019 (Originally 11/29/1969)   ??? Diabetic foot exam  07/19/2019   ??? Diabetic retinal exam  08/12/2019   ??? A1C test (Diabetic or Prediabetic)  09/27/2019   ??? Diabetic microalbuminuria test  09/27/2019   ??? Lipid screen  09/27/2019   ??? Statin Therapy  09/27/2019   ??? Potassium monitoring  09/27/2019   ??? Creatinine monitoring  09/27/2019   ??? Annual Wellness Visit (AWV)  09/28/2019   ??? Breast cancer screen  04/04/2020   ??? Colon cancer screen colonoscopy  04/24/2022   ??? DEXA (modify frequency per FRAX score)  Completed   ??? Pneumococcal 65+ years Vaccine  Completed   ??? Hepatitis C screen  Addressed   ??? Hepatitis A vaccine  Aged Out   ??? Hib vaccine  Aged Out   ??? Meningococcal (ACWY) vaccine  Aged Out       Hemoglobin A1C (%)   Date Value   09/27/2018 6.5   03/28/2018 6.4   09/25/2017 6.5             ( goal A1C is < 7)   Microalb/Crt. Ratio (mcg/mg creat)   Date Value   09/27/2018 CANNOT BE CALCULATED     LDL Cholesterol (mg/dL)   Date Value   09/27/2018 105   03/28/2018 80     LDL Calculated (mg/dL)   Date Value   03/24/2016 143 (H)   09/29/2014 157       (goal LDL is <100)   AST (U/L)   Date Value   09/27/2018 22     ALT (U/L)   Date Value   09/27/2018 14     BUN (mg/dL)   Date Value   09/27/2018 23     BP Readings from Last 3 Encounters:   09/27/18 124/68   03/28/18 126/74   03/08/18 128/60          (goal 120/80)    All Future Testing planned in [facility]  Lab Frequency Next Occurrence               Patient Active Problem List:     Hyperlipidemia     HTN (hypertension)     OA (osteoarthritis)     Anxiety      Tubular adenoma     Dermatophytosis of nail     Benign neoplasm of skin     Diverticulosis of colon     Gastroesophageal reflux disease     History of gastric polyp     Controlled type 2 diabetes mellitus without complication, without long-term current use of insulin (HCC)

## 2019-02-12 ENCOUNTER — Encounter: Attending: Family | Primary: Adult Health

## 2019-03-06 ENCOUNTER — Encounter

## 2019-03-06 MED ORDER — LISINOPRIL 10 MG PO TABS
10 MG | ORAL_TABLET | ORAL | 1 refills | Status: DC
Start: 2019-03-06 — End: 2019-08-29

## 2019-03-06 NOTE — Telephone Encounter (Signed)
Marissa Sawyer is calling to request a refill on the following medication(s):    Medication Request:  Requested Prescriptions     Pending Prescriptions Disp Refills   ??? lisinopril (PRINIVIL;ZESTRIL) 10 MG tablet 90 tablet 1     Sig: TAKE ONE TABLET BY MOUTH DAILY       Last Visit Date (If Applicable):  [id number]    Next Visit Date:    04/03/2019

## 2019-03-25 ENCOUNTER — Ambulatory Visit: Admit: 2019-03-25 | Discharge: 2019-03-25 | Payer: BLUE CROSS/BLUE SHIELD | Attending: Family | Primary: Adult Health

## 2019-03-25 DIAGNOSIS — Z01419 Encounter for gynecological examination (general) (routine) without abnormal findings: Secondary | ICD-10-CM

## 2019-04-03 ENCOUNTER — Ambulatory Visit: Admit: 2019-04-03 | Discharge: 2019-04-03 | Payer: MEDICARE | Attending: Adult Health | Primary: Adult Health

## 2019-04-03 ENCOUNTER — Inpatient Hospital Stay: Payer: MEDICARE | Primary: Adult Health

## 2019-04-03 DIAGNOSIS — E119 Type 2 diabetes mellitus without complications: Secondary | ICD-10-CM

## 2019-04-03 DIAGNOSIS — E7800 Pure hypercholesterolemia, unspecified: Secondary | ICD-10-CM

## 2019-04-03 LAB — POCT GLYCOSYLATED HEMOGLOBIN (HGB A1C): Hemoglobin A1C: 6.4 %

## 2019-04-03 MED ORDER — PROBIOTIC ACIDOPHILUS PO TABS
ORAL_TABLET | Freq: Every day | ORAL | 5 refills | Status: AC
Start: 2019-04-03 — End: 2019-05-03

## 2019-04-03 MED ORDER — SALINE NASAL SPRAY 0.65 % NA SOLN
0.65 | Freq: Every day | NASAL | 5 refills | Status: DC | PRN
Start: 2019-04-03 — End: 2019-09-29

## 2019-04-03 NOTE — Progress Notes (Signed)
Subjective:      Chief Complaint   Patient presents with   ??? 6 Month Follow-Up       Patient ID: Marissa Sawyer is a 69 y.o. female.    Patient is here for routine follow up. Patient has a history of IBS, follows with Dr. Towanda Malkin for GI. she does have intermittent issues with diarrhea, cramping and bloating. She does have some lactose intolerance and has been advised to avoid dairy but admits to not following that. She states if she really watches what she eats then her GI discomfort improves.     Hypertension  This is a chronic problem. The current episode started more than 1 year ago. The problem has been gradually improving since onset. The problem is controlled. Pertinent negatives include no blurred vision, chest pain, headaches, neck pain, palpitations, peripheral edema or shortness of breath. There are no associated agents to hypertension. Risk factors for coronary artery disease include diabetes mellitus, post-menopausal state and sedentary lifestyle. Past treatments include ACE inhibitors. The current treatment provides significant improvement. Compliance problems include diet and exercise.  There is no history of kidney disease, CAD/MI, CVA or heart failure. There is no history of chronic renal disease.   Diabetes  She presents for her follow-up diabetic visit. She has type 2 diabetes mellitus. Her disease course has been stable. There are no hypoglycemic associated symptoms. Pertinent negatives for hypoglycemia include no dizziness, headaches or pallor. Pertinent negatives for diabetes include no blurred vision, no chest pain, no fatigue, no foot paresthesias, no polydipsia, no polyuria and no weakness. There are no hypoglycemic complications. Symptoms are stable. Pertinent negatives for diabetic complications include no CVA, nephropathy or peripheral neuropathy. Risk factors for coronary artery disease include diabetes mellitus, dyslipidemia, hypertension, sedentary lifestyle, stress and post-menopausal.  Current diabetic treatment includes diet and oral agent (monotherapy). She is compliant with treatment all of the time. Her weight is stable. She is following a generally healthy diet. Meal planning includes avoidance of concentrated sweets. She has had a previous visit with a dietitian. She participates in exercise intermittently. Her overall blood glucose range is 130-140 mg/dl. An ACE inhibitor/angiotensin II receptor blocker is being taken. She sees a podiatrist.Eye exam is not current (she states she is scheduling with eye doctor sooner).   Hyperlipidemia  This is a chronic problem. The current episode started more than 1 year ago. The problem is controlled. Recent lipid tests were reviewed and are normal. Exacerbating diseases include diabetes. She has no history of chronic renal disease, hypothyroidism, liver disease, obesity or nephrotic syndrome. Factors aggravating her hyperlipidemia include fatty foods. Pertinent negatives include no chest pain, focal sensory loss, focal weakness, leg pain, myalgias or shortness of breath. Current antihyperlipidemic treatment includes statins (taking it every other day). The current treatment provides significant improvement of lipids. Compliance problems include adherence to diet and adherence to exercise.  Risk factors for coronary artery disease include hypertension, dyslipidemia, a sedentary lifestyle, post-menopausal and diabetes mellitus.     Past medical, surgical, social, and family history reviewed along with medications.     Review of Systems   Constitutional: Negative for appetite change, chills, diaphoresis, fatigue and fever.   HENT:        + nasal dryness   Eyes: Negative for blurred vision and visual disturbance.   Respiratory: Negative for cough, chest tightness, shortness of breath and wheezing.    Cardiovascular: Negative for chest pain, palpitations and leg swelling.   Gastrointestinal: Positive for abdominal pain (  occasional GI upset) and diarrhea  (intermittent). Negative for nausea and vomiting.        + bloating   Endocrine: Negative for polydipsia and polyuria.   Musculoskeletal: Negative for myalgias and neck pain.   Skin: Negative for pallor and rash.   Neurological: Negative for dizziness, focal weakness, weakness, light-headedness and headaches.   All other systems reviewed and are negative.      Objective:   Physical Exam  Vitals signs and nursing note reviewed.   Constitutional:       General: She is not in acute distress.     Appearance: Normal appearance. She is well-developed, well-groomed and overweight. She is not ill-appearing, toxic-appearing or diaphoretic.   HENT:      Head: Normocephalic and atraumatic.      Right Ear: Tympanic membrane, ear canal and external ear normal.      Left Ear: Tympanic membrane, ear canal and external ear normal.   Neck:      Musculoskeletal: Neck supple.   Cardiovascular:      Rate and Rhythm: Normal rate and regular rhythm.      Pulses: Normal pulses.      Heart sounds: Normal heart sounds. No murmur.   Pulmonary:      Effort: Pulmonary effort is normal.      Breath sounds: Normal breath sounds. No wheezing or rhonchi.   Musculoskeletal:      Right lower leg: No edema.      Left lower leg: No edema.   Skin:     General: Skin is warm and dry.   Neurological:      General: No focal deficit present.      Mental Status: She is alert and oriented to person, place, and time.      Gait: Gait normal.   Psychiatric:         Mood and Affect: Mood normal.         Behavior: Behavior normal.         Thought Content: Thought content normal.         Judgment: Judgment normal.       BP 138/70    Pulse 53    Temp 96.9 ??F (36.1 ??C) (Tympanic)    Ht 5' 4.49" (1.638 m)    Wt 152 lb (68.9 kg)    SpO2 97%    BMI 25.70 kg/m??     Assessment:       Diagnosis Orders   1. Controlled type 2 diabetes mellitus without complication, without long-term current use of insulin (HCC)  POCT glycosylated hemoglobin (Hb A1C)   2. Pure  hypercholesterolemia  CBC Auto Differential    Comprehensive Metabolic Panel    Lipid, Fasting   3. Vitamin D insufficiency  Vitamin D 25 Hydroxy   4. Irritable bowel syndrome with diarrhea  Probiotic Acidophilus (FLORANEX) TABS   5. Nasal dryness  sodium chloride (OCEAN) 0.65 % nasal spray           Plan:      Blood work is fasting overnight, 10-12 hours. Advised the patient they can have water in the morning but no food or sugary drinks.   Probiotic started for GI upset, advised to schedule for GI follow up. She believes she is due for her colonoscopy this year  Medication: continue current meds and refills as above.   Follow up: 6 months and as needed.   Will contact with results and further instructions  Complete ordrs as above.  Spent 10 mins answering all questions.   Patient received counseling on the following healthy behaviors: diet changes, avoid lactose  Patient given educational materials per AVS  Discussed use, benefit, and side effects of prescribed medications. Barriers to medication compliance addressed. All patient questions answered. Pt voiced understanding.             [name], APRN - CNP

## 2019-04-04 LAB — COMPREHENSIVE METABOLIC PANEL
AST: 24 U/L (ref <32)
Albumin/Globulin Ratio: 1.3 (ref 1.0–2.5)
Albumin: 4.4 g/dL (ref 3.5–5.2)
Alkaline Phosphatase: 69 U/L (ref 35–104)
CO2: 26 mmol/L (ref 20–31)
Calcium: 9.4 mg/dL (ref 8.6–10.4)
Chloride: 103 mmol/L (ref 98–107)
Creatinine: 0.75 mg/dL (ref 0.50–0.90)
GFR African American: >60 mL/min (ref >60)
GFR Non-African American: >60 mL/min (ref >60)
Potassium: 4.1 mmol/L (ref 3.7–5.3)
Sodium: 138 mmol/L (ref 135–144)
Total Bilirubin: 0.48 mg/dL (ref 0.3–1.2)
Total Protein: 7.9 g/dL (ref 6.4–8.3)

## 2019-04-04 LAB — CBC WITH AUTO DIFFERENTIAL
Absolute Immature Granulocyte: <0.03 k/uL (ref 0.00–0.30)
Basophils Absolute: <0.03 k/uL (ref 0.00–0.20)
Basophils: 0 % (ref 0–2)
Eosinophils %: 4 % (ref 1–4)
Hematocrit: 41.4 % (ref 36.3–47.1)
Hemoglobin: 13.1 g/dL (ref 11.9–15.1)
Immature Granulocytes: 0 %
MCHC: 31.6 g/dL (ref 28.4–34.8)
MCV: 89.2 fL (ref 82.6–102.9)
MPV: 10.3 fL (ref 8.1–13.5)
Monocytes: 11 % (ref 3–12)
NRBC Automated: 0 per 100 WBC
Platelets: 245 k/uL (ref 138–453)
RBC: 4.64 m/uL (ref 3.95–5.11)
Seg Neutrophils: 54 % (ref 36–65)
Segs Absolute: 3.22 k/uL (ref 1.50–8.10)
WBC: 5.9 k/uL (ref 3.5–11.3)

## 2019-04-04 LAB — LIPID, FASTING
Chol/HDL Ratio: 3.7 (ref <5)
HDL: 42 mg/dL (ref >40)
LDL Cholesterol: 91 mg/dL (ref 0–130)
Triglyceride, Fasting: 120 mg/dL (ref <150)

## 2019-06-05 ENCOUNTER — Ambulatory Visit: Payer: MEDICARE | Primary: Adult Health

## 2019-06-13 ENCOUNTER — Encounter

## 2019-06-13 MED ORDER — METFORMIN HCL ER 500 MG PO TB24
500 MG | ORAL_TABLET | ORAL | 1 refills | Status: DC
Start: 2019-06-13 — End: 2019-08-29

## 2019-06-13 NOTE — Telephone Encounter (Signed)
Last visit: 04/03/19  Last Med refill: 12/04/18        Next Visit Date:  Future Appointments   Date Time Provider [place]   07/07/2019  6:30 PM [place] DIGITAL RM [facility] MAMMO [place] [facility]   07/07/2019  7:00 PM [place] DEXA RM [facility]CZ MAMMO [facility]   09/29/2019 10:30 AM [name], APRN - [place] [facility]   Topic Date Due   ??? COVID-19 Vaccine (1) Never done   ??? Shingles Vaccine (1 of 2) 09/27/2019 (Originally 11/30/1999)   ??? DTaP/Tdap/Td vaccine (1 - Tdap) 12/03/2019 (Originally 11/29/1969)   ??? Diabetic foot exam  07/19/2019   ??? Diabetic retinal exam  08/12/2019   ??? Diabetic microalbuminuria test  09/27/2019   ??? Annual Wellness Visit (AWV)  09/28/2019   ??? Flu vaccine (Season Ended) 11/05/2019   ??? A1C test (Diabetic or Prediabetic)  04/02/2020   ??? Lipid screen  04/02/2020   ??? Potassium monitoring  04/02/2020   ??? Creatinine monitoring  04/02/2020   ??? Breast cancer screen  04/04/2020   ??? Colon cancer screen colonoscopy  04/24/2022   ??? DEXA (modify frequency per FRAX score)  Completed   ??? Pneumococcal 65+ years Vaccine  Completed   ??? Hepatitis C screen  Completed   ??? Hepatitis A vaccine  Aged Out   ??? Hib vaccine  Aged Out   ??? Meningococcal (ACWY) vaccine  Aged Out       Hemoglobin A1C (%)   Date Value   04/03/2019 6.4   09/27/2018 6.5   03/28/2018 6.4             ( goal A1C is < 7)   Microalb/Crt. Ratio (mcg/mg creat)   Date Value   09/27/2018 CANNOT BE CALCULATED     LDL Cholesterol (mg/dL)   Date Value   04/03/2019 91   09/27/2018 105     LDL Calculated (mg/dL)   Date Value   03/24/2016 143 (H)   09/29/2014 157       (goal LDL is <100)   AST (U/L)   Date Value   04/03/2019 24     ALT (U/L)   Date Value   04/03/2019 16     BUN (mg/dL)   Date Value   04/03/2019 21     BP Readings from Last 3 Encounters:   04/03/19 138/70   03/25/19 138/64   09/27/18 124/68          (goal 120/80)    All Future Testing planned in [facility]  Lab Frequency Next Occurrence   MAM DIGITAL SCREEN W OR WO CAD  BILATERAL Once 03/06/2020   DEXA BONE DENSITY AXIAL SKELETON Once 03/20/2020   HM COLONOSCOPY Once 06/23/2019               Patient Active Problem List:     Hyperlipidemia     HTN (hypertension)     OA (osteoarthritis)     Anxiety     Tubular adenoma     Dermatophytosis of nail     Benign neoplasm of skin     Diverticulosis of colon     Gastroesophageal reflux disease     History of gastric polyp     Controlled type 2 diabetes mellitus without complication, without long-term current use of insulin ([place])

## 2019-07-07 ENCOUNTER — Inpatient Hospital Stay: Admit: 2019-07-07 | Payer: MEDICARE | Primary: Adult Health

## 2019-07-07 ENCOUNTER — Encounter

## 2019-07-07 DIAGNOSIS — Z780 Asymptomatic menopausal state: Secondary | ICD-10-CM

## 2019-07-07 DIAGNOSIS — Z1231 Encounter for screening mammogram for malignant neoplasm of breast: Secondary | ICD-10-CM

## 2019-08-19 ENCOUNTER — Ambulatory Visit: Admit: 2019-08-19 | Discharge: 2019-08-19 | Payer: MEDICARE | Attending: Foot & Ankle Surgery | Primary: Adult Health

## 2019-08-19 DIAGNOSIS — B351 Tinea unguium: Secondary | ICD-10-CM

## 2019-08-19 NOTE — Progress Notes (Signed)
SUBJECTIVE: Marissa Sawyer is a 69 y.o. female who returns to the office with chief complaint of painful fungal toenails. Patient relates toe nails are thickened/difficult to trim as well as painful with ambulation and with shoe gear.   Chief Complaint   Patient presents with   ??? Nail Problem     b/l nail trim/ last seen Joseph Pierini 04/03/2019   ??? Diabetes     last blood sugar 116     Review of Systems   Constitutional: Negative for activity change, appetite change, chills, diaphoresis, fatigue and fever.   Respiratory: Negative for shortness of breath.    Cardiovascular: Negative for leg swelling.   Gastrointestinal: Negative for diarrhea and nausea.   Endocrine: Negative for cold intolerance, heat intolerance and polyuria.   Musculoskeletal: Positive for arthralgias. Negative for back pain, gait problem, joint swelling and myalgias.   Skin: Negative for color change, pallor, rash and wound.   Allergic/Immunologic: Negative for environmental allergies and food allergies.   Neurological: Negative for dizziness, weakness, light-headedness and numbness.   Hematological: Does not bruise/bleed easily.   Psychiatric/Behavioral: Negative for behavioral problems, confusion and self-injury. The patient is not nervous/anxious.      OBJECTIVE: Clinical evaluation of patient reveals nails 1,2,3,4,5 of the right foot and nails 1,2,3,4,5, of the left foot to present with thickness, elongation, discoloration, brittleness, and subungual debris. There was pain with palpation and debridement of the toenails of the bilateral feet. No open lesions noted to either foot today.   The right DP pulse is not palpable.   The left DP pulse is not palpable.   The right PT pulse is not palpable.   The left PT pulse is not palpable.   Protective sensation is present to the right plantar foot as noted with a 5.07 Semmes-Weinstein monofilament.   Protective sensation is present to the left plantar foot as noted with a 5.07 Semmes-Weinstein  monofilament.   Glucose: 116 mg/dl.    Class A Findings (1 needed)   []  Non-traumatic amputation of foot or integral skeleton portion thereof.   []  Q7.      Class B Findings (2 needed)   1. [x]  Absent posterior tibial pulse   2. [x]  Absent dorsalis pedis pulse   3. []  Advanced trophic changes; three of the following are required:   ??         []  hair growth (decrease or absence)   ??         []  nail changes (thickening)   ??         []  pigmentary changes (discoloration)   ??         []  skin texture (thin, shiny)   ??         []  skin color (rubor or redness)   [x]  Q8.      Class C Findings (1 Class B, 2 Class C needed)   1. []  Claudication   2. []  Temperature changes   3. []  Edema   4. []  Paresthesia   5. []  Burning   []  Q9.     ASSESSMENT:    Diagnosis Orders   1. Onychomycosis of toenail  PR DEBRIDEMENT OF NAILS, 6 OR MORE    HM DIABETES FOOT EXAM   2. Pain of toes of both feet  PR DEBRIDEMENT OF NAILS, 6 OR MORE    HM DIABETES FOOT EXAM   3. Type 2 diabetes mellitus with peripheral vascular disease (HCC)  PR DEBRIDEMENT OF NAILS,  6 OR MORE    HM DIABETES FOOT EXAM     PLAN: Toenails 1,2,3,4,5 of the right foot and 1,2,3,4,5 of the left foot were debrided in length and thickness using a nail nipper and a grinder. Return in about 9 weeks (around 10/21/2019) for At risk diabetic foot care.   08/19/2019      [name], DPM

## 2019-08-29 ENCOUNTER — Encounter

## 2019-08-29 MED ORDER — ATORVASTATIN CALCIUM 20 MG PO TABS
20 MG | ORAL_TABLET | ORAL | 3 refills | Status: DC
Start: 2019-08-29 — End: 2019-08-29

## 2019-08-29 MED ORDER — METFORMIN HCL ER 500 MG PO TB24
500 MG | ORAL_TABLET | ORAL | 1 refills | Status: DC
Start: 2019-08-29 — End: 2020-06-01

## 2019-08-29 MED ORDER — OMEPRAZOLE 40 MG PO CPDR
40 MG | ORAL_CAPSULE | Freq: Every day | ORAL | 1 refills | Status: DC
Start: 2019-08-29 — End: 2020-05-31

## 2019-08-29 MED ORDER — ATORVASTATIN CALCIUM 20 MG PO TABS
20 MG | ORAL_TABLET | ORAL | 3 refills | Status: DC
Start: 2019-08-29 — End: 2020-04-01

## 2019-08-29 MED ORDER — LISINOPRIL 10 MG PO TABS
10 MG | ORAL_TABLET | ORAL | 1 refills | Status: DC
Start: 2019-08-29 — End: 2020-02-19

## 2019-08-29 NOTE — Telephone Encounter (Signed)
LAST VISIT:   04/03/2019     Future Appointments   Date Time Provider [place]   09/29/2019 10:30 AM [name], [place] Maintenance   Topic Date Due   ??? Diabetic retinal exam  08/12/2019   ??? Annual Wellness Visit (AWV)  09/28/2019   ??? Shingles Vaccine (1 of 2) 09/27/2019 (Originally 11/30/1999)   ??? DTaP/Tdap/Td vaccine (1 - Tdap) 12/03/2019 (Originally 11/29/1969)   ??? Diabetic microalbuminuria test  09/27/2019   ??? Flu vaccine (Season Ended) 11/05/2019   ??? A1C test (Diabetic or Prediabetic)  04/02/2020   ??? Lipid screen  04/02/2020   ??? Potassium monitoring  04/02/2020   ??? Creatinine monitoring  04/02/2020   ??? Diabetic foot exam  08/20/2020   ??? Breast cancer screen  07/06/2021   ??? Colon cancer screen colonoscopy  04/24/2022   ??? DEXA (modify frequency per FRAX score)  Completed   ??? Pneumococcal 65+ years Vaccine  Completed   ??? COVID-19 Vaccine  Completed   ??? Hepatitis C screen  Completed   ??? Hepatitis A vaccine  Aged Out   ??? Hib vaccine  Aged Out   ??? Meningococcal (ACWY) vaccine  Aged Out       Hemoglobin A1C (%)   Date Value   04/03/2019 6.4   09/27/2018 6.5   03/28/2018 6.4             ( goal A1C is < 7)   Microalb/Crt. Ratio (mcg/mg creat)   Date Value   09/27/2018 CANNOT BE CALCULATED     LDL Cholesterol (mg/dL)   Date Value   04/03/2019 91     LDL Calculated (mg/dL)   Date Value   03/24/2016 143 (H)       (goal LDL is <100)   AST (U/L)   Date Value   04/03/2019 24     ALT (U/L)   Date Value   04/03/2019 16     BUN (mg/dL)   Date Value   04/03/2019 21     BP Readings from Last 3 Encounters:   04/03/19 138/70   03/25/19 138/64   09/27/18 124/68          (goal 120/80)    All Future Testing planned in [facility]  Lab Frequency Next Occurrence   HM COLONOSCOPY Once 06/23/2019               Patient Active Problem List:     Hyperlipidemia     HTN (hypertension)     OA (osteoarthritis)     Anxiety     Tubular adenoma     Dermatophytosis of nail     Benign neoplasm of skin      Diverticulosis of colon     Gastroesophageal reflux disease     History of gastric polyp     Controlled type 2 diabetes mellitus without complication, without long-term current use of insulin ([place])

## 2019-08-29 NOTE — Telephone Encounter (Signed)
LAST VISIT:   04/03/2019         Future Appointments   Date Time Provider [place]   09/29/2019 10:30 AM [name], [place] [facility]   Topic Date Due   ??? Diabetic retinal exam  08/12/2019   ??? Annual Wellness Visit (AWV)  09/28/2019   ??? Shingles Vaccine (1 of 2) 09/27/2019 (Originally 11/30/1999)   ??? DTaP/Tdap/Td vaccine (1 - Tdap) 12/03/2019 (Originally 11/29/1969)   ??? Diabetic microalbuminuria test  09/27/2019   ??? Flu vaccine (Season Ended) 11/05/2019   ??? A1C test (Diabetic or Prediabetic)  04/02/2020   ??? Lipid screen  04/02/2020   ??? Potassium monitoring  04/02/2020   ??? Creatinine monitoring  04/02/2020   ??? Diabetic foot exam  08/20/2020   ??? Breast cancer screen  07/06/2021   ??? Colon cancer screen colonoscopy  04/24/2022   ??? DEXA (modify frequency per FRAX score)  Completed   ??? Pneumococcal 65+ years Vaccine  Completed   ??? COVID-19 Vaccine  Completed   ??? Hepatitis C screen  Completed   ??? Hepatitis A vaccine  Aged Out   ??? Hib vaccine  Aged Out   ??? Meningococcal (ACWY) vaccine  Aged Out       Hemoglobin A1C (%)   Date Value   04/03/2019 6.4   09/27/2018 6.5   03/28/2018 6.4             ( goal A1C is < 7)   Microalb/Crt. Ratio (mcg/mg creat)   Date Value   09/27/2018 CANNOT BE CALCULATED     LDL Cholesterol (mg/dL)   Date Value   04/03/2019 91     LDL Calculated (mg/dL)   Date Value   03/24/2016 143 (H)       (goal LDL is <100)   AST (U/L)   Date Value   04/03/2019 24     ALT (U/L)   Date Value   04/03/2019 16     BUN (mg/dL)   Date Value   04/03/2019 21     BP Readings from Last 3 Encounters:   04/03/19 138/70   03/25/19 138/64   09/27/18 124/68          (goal 120/80)    All Future Testing planned in [facility]  Lab Frequency Next Occurrence   HM COLONOSCOPY Once 06/23/2019               Patient Active Problem List:     Hyperlipidemia     HTN (hypertension)     OA (osteoarthritis)     Anxiety     Tubular adenoma     Dermatophytosis of nail     Benign neoplasm of  skin     Diverticulosis of colon     Gastroesophageal reflux disease     History of gastric polyp     Controlled type 2 diabetes mellitus without complication, without long-term current use of insulin ([place])

## 2019-09-29 ENCOUNTER — Inpatient Hospital Stay: Payer: MEDICARE | Primary: Adult Health

## 2019-09-29 ENCOUNTER — Ambulatory Visit: Admit: 2019-09-29 | Discharge: 2019-09-29 | Payer: MEDICARE | Attending: Adult Health | Primary: Adult Health

## 2019-09-29 DIAGNOSIS — E119 Type 2 diabetes mellitus without complications: Secondary | ICD-10-CM

## 2019-09-29 DIAGNOSIS — E559 Vitamin D deficiency, unspecified: Secondary | ICD-10-CM

## 2019-09-29 LAB — CBC WITH AUTO DIFFERENTIAL
Absolute Eos #: 0.18 10*3/uL (ref 0.00–0.44)
Absolute Immature Granulocyte: <0.03 10*3/uL (ref 0.00–0.30)
Absolute Lymph #: 1.83 10*3/uL (ref 1.10–3.70)
Absolute Mono #: 0.6 10*3/uL (ref 0.10–1.20)
Basophils Absolute: <0.03 10*3/uL (ref 0.00–0.20)
Basophils: 0 % (ref 0–2)
Eosinophils %: 3 % (ref 1–4)
Hematocrit: 40 % (ref 36.3–47.1)
Hemoglobin: 12.9 g/dL (ref 11.9–15.1)
Immature Granulocytes: 0 %
Lymphocytes: 31 % (ref 24–43)
MCH: 28.7 pg (ref 25.2–33.5)
MCHC: 32.3 g/dL (ref 28.4–34.8)
MCV: 89.1 fL (ref 82.6–102.9)
MPV: 10.2 fL (ref 8.1–13.5)
Monocytes: 10 % (ref 3–12)
NRBC Automated: 0 per 100 WBC
Platelets: 240 10*3/uL (ref 138–453)
RBC: 4.49 m/uL (ref 3.95–5.11)
RDW: 13.1 % (ref 11.8–14.4)
Seg Neutrophils: 56 % (ref 36–65)
Segs Absolute: 3.3 10*3/uL (ref 1.50–8.10)
WBC: 6 10*3/uL (ref 3.5–11.3)

## 2019-09-29 LAB — POCT GLYCOSYLATED HEMOGLOBIN (HGB A1C): Hemoglobin A1C: 6.4 %

## 2019-09-29 NOTE — Progress Notes (Signed)
Subjective:      Chief Complaint   Patient presents with   ??? Follow-up       Patient ID: Marissa Sawyer is a 69 y.o. female.    Patient is here for routine follow up. Patient has a history of IBS, follows with Dr. Towanda Malkin for GI. she does have intermittent issues with diarrhea, cramping and bloating. She does have some lactose intolerance and has been advised to avoid dairy but admits to not following that. She states if she really watches what she eats then her GI discomfort improves. + history of colon polyp, it appears colonoscopy is due next year. She hasn't tried imodium. She has tried pepto but really doesn't note much relief.     Hypertension  This is a chronic problem. The current episode started more than 1 year ago. The problem has been gradually improving since onset. The problem is controlled. Pertinent negatives include no blurred vision, chest pain, headaches, neck pain, palpitations, peripheral edema or shortness of breath. There are no associated agents to hypertension. Risk factors for coronary artery disease include diabetes mellitus, post-menopausal state and sedentary lifestyle. Past treatments include ACE inhibitors. The current treatment provides significant improvement. Compliance problems include diet and exercise.  There is no history of kidney disease, CAD/MI, CVA or heart failure. There is no history of chronic renal disease.   Diabetes  She presents for her follow-up diabetic visit. She has type 2 diabetes mellitus. Her disease course has been stable. There are no hypoglycemic associated symptoms. Pertinent negatives for hypoglycemia include no dizziness, headaches or pallor. Pertinent negatives for diabetes include no blurred vision, no chest pain, no fatigue, no foot paresthesias, no polydipsia, no polyuria and no weakness. There are no hypoglycemic complications. Symptoms are stable. There are no diabetic complications. Pertinent negatives for diabetic complications include no CVA,  nephropathy or peripheral neuropathy. Risk factors for coronary artery disease include diabetes mellitus, dyslipidemia, hypertension, sedentary lifestyle, stress and post-menopausal. Current diabetic treatment includes diet and oral agent (monotherapy). She is compliant with treatment all of the time. Her weight is stable. She is following a generally healthy diet. Meal planning includes avoidance of concentrated sweets. She has had a previous visit with a dietitian. She participates in exercise intermittently. (140-150) An ACE inhibitor/angiotensin II receptor blocker is being taken. She sees a podiatrist.Eye exam is current (she states she did get new glasses in Jan. so she thinks that he did do the full exam).   Hyperlipidemia  This is a chronic problem. The current episode started more than 1 year ago. The problem is controlled. Recent lipid tests were reviewed and are normal. Exacerbating diseases include diabetes. She has no history of chronic renal disease, hypothyroidism, liver disease, obesity or nephrotic syndrome. Factors aggravating her hyperlipidemia include fatty foods. Pertinent negatives include no chest pain, focal sensory loss, focal weakness, leg pain, myalgias or shortness of breath. Current antihyperlipidemic treatment includes statins (taking it every other day). The current treatment provides significant improvement of lipids. Compliance problems include adherence to diet and adherence to exercise.  Risk factors for coronary artery disease include hypertension, dyslipidemia, a sedentary lifestyle, post-menopausal and diabetes mellitus.     Past medical, surgical, social, and family history reviewed along with medications.     Review of Systems   Constitutional: Negative for appetite change, chills, diaphoresis, fatigue and fever.   Eyes: Negative for blurred vision.   Respiratory: Negative for cough, chest tightness, shortness of breath and wheezing.    Cardiovascular:  Negative for chest pain,  palpitations and leg swelling.   Gastrointestinal: Positive for diarrhea (intermittent). Negative for abdominal pain, constipation, nausea, rectal pain and vomiting.   Endocrine: Negative for polydipsia and polyuria.   Musculoskeletal: Negative for myalgias and neck pain.   Skin: Negative for pallor.   Neurological: Negative for dizziness, focal weakness, weakness and headaches.   All other systems reviewed and are negative.      Objective:   Physical Exam  Vitals and nursing note reviewed.   Constitutional:       General: She is not in acute distress.     Appearance: Normal appearance. She is normal weight. She is not ill-appearing, toxic-appearing or diaphoretic.   HENT:      Head: Normocephalic and atraumatic.   Cardiovascular:      Rate and Rhythm: Normal rate and regular rhythm.      Pulses: Normal pulses.      Heart sounds: Normal heart sounds. No murmur heard.     Pulmonary:      Effort: Pulmonary effort is normal.      Breath sounds: Normal breath sounds. No wheezing or rhonchi.   Musculoskeletal:      Cervical back: Neck supple.   Skin:     General: Skin is warm and dry.   Neurological:      General: No focal deficit present.      Mental Status: She is alert and oriented to person, place, and time.      Gait: Gait normal.   Psychiatric:         Mood and Affect: Mood normal.         Behavior: Behavior normal.         Thought Content: Thought content normal.         Judgment: Judgment normal.           BP 116/64    Pulse 53    Temp 97.5 ??F (36.4 ??C) (Tympanic)    Ht 5' 4.02" (1.626 m)    Wt 154 lb (69.9 kg)    SpO2 98%    BMI 26.42 kg/m??     Assessment:       Diagnosis Orders   1. Controlled type 2 diabetes mellitus without complication, without long-term current use of insulin (HCC)  POCT glycosylated hemoglobin (Hb A1C)    Microalbumin, Ur   2. Pure hypercholesterolemia  CBC Auto Differential    Comprehensive Metabolic Panel    Lipid, Fasting   3. Essential hypertension     4. Vitamin D insufficiency   Vitamin D 25 Hydroxy   5. Irritable bowel syndrome with diarrhea             Plan:      Reminded to get yearly eye exam  Avoid dairy, use imodium prn for diarrhea  F/U with Dr. Towanda Malkin next year for colonoscopy due history of colon polyp  Blood work is fasting overnight, 10-12 hours. Advised the patient they can have water in the morning but no food or sugary drinks. Will contact with results and further instructions  Medication: continue current meds and refills as above.   Follow up: 6 months and as needed.   Complete ordrs as above.   Spent 10 mins answering all questions.   Patient given educational materials per AVS  Discussed use, benefit, and side effects of prescribed medications. Barriers to medication compliance addressed. All patient questions answered. Pt voiced understanding.  [name], APRN - CNP

## 2019-09-30 ENCOUNTER — Encounter

## 2019-09-30 LAB — COMPREHENSIVE METABOLIC PANEL
ALT: 19 U/L (ref 5–33)
AST: 29 U/L (ref <32)
Albumin/Globulin Ratio: 1.3 (ref 1.0–2.5)
Albumin: 4.8 g/dL (ref 3.5–5.2)
Alkaline Phosphatase: 84 U/L (ref 35–104)
Anion Gap: 20 mmol/L — ABNORMAL HIGH (ref 9–17)
BUN: 24 mg/dL — ABNORMAL HIGH (ref 8–23)
CO2: 19 mmol/L — ABNORMAL LOW (ref 20–31)
Calcium: 10.1 mg/dL (ref 8.6–10.4)
Chloride: 116 mmol/L — ABNORMAL HIGH (ref 98–107)
Creatinine: 0.93 mg/dL — ABNORMAL HIGH (ref 0.50–0.90)
GFR African American: >60 mL/min (ref >60)
GFR Non-African American: 60 mL/min — ABNORMAL LOW (ref >60)
Glucose: 144 mg/dL — ABNORMAL HIGH (ref 70–99)
Potassium: 5.1 mmol/L (ref 3.7–5.3)
Sodium: 155 mmol/L — ABNORMAL HIGH (ref 135–144)
Total Bilirubin: 0.43 mg/dL (ref 0.3–1.2)
Total Protein: 8.6 g/dL — ABNORMAL HIGH (ref 6.4–8.3)

## 2019-09-30 LAB — LIPID, FASTING
Chol/HDL Ratio: 4.1 (ref <5)
Cholesterol, Fasting: 185 mg/dL (ref <200)
HDL: 45 mg/dL (ref >40)
LDL Cholesterol: 111 mg/dL (ref 0–130)
Triglyceride, Fasting: 147 mg/dL (ref <150)

## 2019-09-30 LAB — VITAMIN D 25 HYDROXY: Vit D, 25-Hydroxy: 29.6 ng/mL — ABNORMAL LOW (ref 30.0–100.0)

## 2019-10-01 LAB — MICROALBUMIN, UR
Creatinine, Ur: 118.6 mg/dL (ref 28.0–217.0)
Microalb, Ur: <12 mg/L (ref <21)

## 2019-11-28 ENCOUNTER — Inpatient Hospital Stay: Payer: MEDICARE | Primary: Adult Health

## 2019-11-28 DIAGNOSIS — E870 Hyperosmolality and hypernatremia: Secondary | ICD-10-CM

## 2019-11-28 LAB — BASIC METABOLIC PANEL
Anion Gap: 9 mmol/L (ref 9–17)
BUN: 21 mg/dL (ref 8–23)
CO2: 27 mmol/L (ref 20–31)
Calcium: 9.5 mg/dL (ref 8.6–10.4)
Chloride: 102 mmol/L (ref 98–107)
Creatinine: 0.87 mg/dL (ref 0.50–0.90)
GFR African American: >60 mL/min (ref >60)
GFR Non-African American: >60 mL/min (ref >60)
Glucose: 110 mg/dL — ABNORMAL HIGH (ref 70–99)
Potassium: 4.4 mmol/L (ref 3.7–5.3)
Sodium: 138 mmol/L (ref 135–144)

## 2020-01-23 ENCOUNTER — Ambulatory Visit: Admit: 2020-01-23 | Discharge: 2020-01-23 | Payer: MEDICARE | Attending: Foot & Ankle Surgery | Primary: Adult Health

## 2020-01-23 DIAGNOSIS — B351 Tinea unguium: Secondary | ICD-10-CM

## 2020-01-26 NOTE — Progress Notes (Signed)
SUBJECTIVE: Marissa Sawyer is a 69 y.o. female who returns to the office with chief complaint of painful fungal toenails. Patient relates toe nails are thickened/difficult to trim as well as painful with ambulation and with shoe gear.   Chief Complaint   Patient presents with   ??? Nail Problem     b/l nail trim, last seen Joseph Pierini 09/29/19   ??? Diabetes     A1C 6.5     Review of Systems   Constitutional: Negative for activity change, appetite change, chills, diaphoresis, fatigue and fever.   Respiratory: Negative for shortness of breath.    Cardiovascular: Negative for leg swelling.   Gastrointestinal: Negative for diarrhea and nausea.   Endocrine: Negative for cold intolerance, heat intolerance and polyuria.   Musculoskeletal: Positive for arthralgias. Negative for back pain, gait problem, joint swelling and myalgias.   Skin: Negative for color change, pallor, rash and wound.   Allergic/Immunologic: Negative for environmental allergies and food allergies.   Neurological: Negative for dizziness, weakness, light-headedness and numbness.   Hematological: Does not bruise/bleed easily.   Psychiatric/Behavioral: Negative for behavioral problems, confusion and self-injury. The patient is not nervous/anxious.      OBJECTIVE: Clinical evaluation of patient reveals nails 1,2,3,4,5 of the right foot and nails 1,2,3,4,5, of the left foot to present with thickness, elongation, discoloration, brittleness, and subungual debris. There was pain with palpation and debridement of the toenails of the bilateral feet. No open lesions noted to either foot today.   The right DP pulse is not palpable.   The left DP pulse is not palpable.   The right PT pulse is not palpable.   The left PT pulse is not palpable.   Protective sensation is present to the right plantar foot as noted with a 5.07 Semmes-Weinstein monofilament.   Protective sensation is present to the left plantar foot as noted with a 5.07 Semmes-Weinstein monofilament.    HbA1c: 6.5 %.    Class A Findings (1 needed)   []  Non-traumatic amputation of foot or integral skeleton portion thereof.   []  Q7.      Class B Findings (2 needed)   1. [x]  Absent posterior tibial pulse   2. [x]  Absent dorsalis pedis pulse   3. []  Advanced trophic changes; three of the following are required:   ??         []  hair growth (decrease or absence)   ??         []  nail changes (thickening)   ??         []  pigmentary changes (discoloration)   ??         []  skin texture (thin, shiny)   ??         []  skin color (rubor or redness)   [x]  Q8.      Class C Findings (1 Class B, 2 Class C needed)   1. []  Claudication   2. []  Temperature changes   3. []  Edema   4. []  Paresthesia   5. []  Burning   []  Q9.     ASSESSMENT:    Diagnosis Orders   1. Onychomycosis of toenail  PR DEBRIDEMENT OF NAILS, 6 OR MORE    HM DIABETES FOOT EXAM   2. Pain of toes of both feet  PR DEBRIDEMENT OF NAILS, 6 OR MORE    HM DIABETES FOOT EXAM   3. Type 2 diabetes mellitus with peripheral vascular disease (HCC)  PR DEBRIDEMENT OF NAILS, 6 OR  MORE    HM DIABETES FOOT EXAM     PLAN: Toenails 1,2,3,4,5 of the right foot and 1,2,3,4,5 of the left foot were debrided in length and thickness using a nail nipper and a grinder. Return in about 9 weeks (around 03/26/2020) for At risk diabetic foot care.   01/23/2020      [name], DPM

## 2020-02-16 ENCOUNTER — Encounter

## 2020-02-16 NOTE — Telephone Encounter (Signed)
Last seen 09/29/2019    Next Visit Date:  Future Appointments   Date Time Provider [place]   04/01/2020 10:45 AM [name], [place] Maintenance   Topic Date Due   ??? DTaP/Tdap/Td vaccine (1 - Tdap) Never done   ??? Shingles Vaccine (1 of 2) Never done   ??? COVID-19 Vaccine (2 - Booster for Janssen series) 07/06/2019   ??? Diabetic retinal exam  08/12/2019   ??? Annual Wellness Visit (AWV)  09/28/2019   ??? Flu vaccine (1) Never done   ??? A1C test (Diabetic or Prediabetic)  09/28/2020   ??? Diabetic microalbuminuria test  09/28/2020   ??? Lipid screen  09/28/2020   ??? Potassium monitoring  11/27/2020   ??? Creatinine monitoring  11/27/2020   ??? Diabetic foot exam  01/25/2021   ??? Breast cancer screen  07/06/2021   ??? Colon cancer screen colonoscopy  04/24/2022   ??? DEXA (modify frequency per FRAX score)  Completed   ??? Pneumococcal 65+ years Vaccine  Completed   ??? Hepatitis C screen  Completed   ??? Hepatitis A vaccine  Aged Out   ??? Hib vaccine  Aged Out   ??? Meningococcal (ACWY) vaccine  Aged Out       Hemoglobin A1C (%)   Date Value   09/29/2019 6.4   04/03/2019 6.4   09/27/2018 6.5             ( goal A1C is < 7)   Microalb/Crt. Ratio (mcg/mg creat)   Date Value   09/29/2019 CANNOT BE CALCULATED     LDL Cholesterol (mg/dL)   Date Value   09/29/2019 111     LDL Calculated (mg/dL)   Date Value   03/24/2016 143 (H)       (goal LDL is <100)   AST (U/L)   Date Value   09/29/2019 29     ALT (U/L)   Date Value   09/29/2019 19     BUN (mg/dL)   Date Value   11/28/2019 21     BP Readings from Last 3 Encounters:   09/29/19 116/64   04/03/19 138/70   03/25/19 138/64          (goal 120/80)    All Future Testing planned in [facility]  Lab Frequency Next Occurrence   HM COLONOSCOPY Once 06/23/2019               Patient Active Problem List:     Hyperlipidemia     HTN (hypertension)     OA (osteoarthritis)     Anxiety     Tubular adenoma     Dermatophytosis of nail     Benign neoplasm of skin      Diverticulosis of colon     Gastroesophageal reflux disease     History of gastric polyp     Controlled type 2 diabetes mellitus without complication, without long-term current use of insulin ([place])

## 2020-02-17 MED ORDER — BUSPIRONE HCL 10 MG PO TABS
10 MG | ORAL_TABLET | Freq: Three times a day (TID) | ORAL | 1 refills | Status: DC | PRN
Start: 2020-02-17 — End: 2021-04-04

## 2020-02-18 ENCOUNTER — Encounter

## 2020-02-19 MED ORDER — LISINOPRIL 10 MG PO TABS
10 MG | ORAL_TABLET | ORAL | 1 refills | Status: DC
Start: 2020-02-19 — End: 2020-07-30

## 2020-02-19 NOTE — Telephone Encounter (Signed)
Last Visit:  09/29/2019     Next Visit Date:  Future Appointments   Date Time Provider [place]   04/01/2020 10:45 AM [name], [place] Maintenance   Topic Date Due   ??? DTaP/Tdap/Td vaccine (1 - Tdap) Never done   ??? Shingles Vaccine (1 of 2) Never done   ??? COVID-19 Vaccine (2 - Booster for Janssen series) 07/06/2019   ??? Diabetic retinal exam  08/12/2019   ??? Annual Wellness Visit (AWV)  09/28/2019   ??? Flu vaccine (1) Never done   ??? A1C test (Diabetic or Prediabetic)  09/28/2020   ??? Diabetic microalbuminuria test  09/28/2020   ??? Lipid screen  09/28/2020   ??? Potassium monitoring  11/27/2020   ??? Creatinine monitoring  11/27/2020   ??? Diabetic foot exam  01/25/2021   ??? Breast cancer screen  07/06/2021   ??? Colon cancer screen colonoscopy  04/24/2022   ??? DEXA (modify frequency per FRAX score)  Completed   ??? Pneumococcal 65+ years Vaccine  Completed   ??? Hepatitis C screen  Completed   ??? Hepatitis A vaccine  Aged Out   ??? Hib vaccine  Aged Out   ??? Meningococcal (ACWY) vaccine  Aged Out       Hemoglobin A1C (%)   Date Value   09/29/2019 6.4   04/03/2019 6.4   09/27/2018 6.5             ( goal A1C is < 7)   Microalb/Crt. Ratio (mcg/mg creat)   Date Value   09/29/2019 CANNOT BE CALCULATED     LDL Cholesterol (mg/dL)   Date Value   09/29/2019 111   04/03/2019 91     LDL Calculated (mg/dL)   Date Value   03/24/2016 143 (H)   09/29/2014 157       (goal LDL is <100)   AST (U/L)   Date Value   09/29/2019 29     ALT (U/L)   Date Value   09/29/2019 19     BUN (mg/dL)   Date Value   11/28/2019 21     BP Readings from Last 3 Encounters:   09/29/19 116/64   04/03/19 138/70   03/25/19 138/64          (goal 120/80)    All Future Testing planned in [facility]  Lab Frequency Next Occurrence   HM COLONOSCOPY Once 06/23/2019               Patient Active Problem List:     Hyperlipidemia     HTN (hypertension)     OA (osteoarthritis)     Anxiety     Tubular adenoma     Dermatophytosis of nail      Benign neoplasm of skin     Diverticulosis of colon     Gastroesophageal reflux disease     History of gastric polyp     Controlled type 2 diabetes mellitus without complication, without long-term current use of insulin ([place])

## 2020-03-29 MED ORDER — ONETOUCH ULTRA VI STRP
ORAL_STRIP | 3 refills | Status: DC
Start: 2020-03-29 — End: 2020-03-29

## 2020-03-29 MED ORDER — ONETOUCH ULTRA VI STRP
ORAL_STRIP | 3 refills | Status: AC
Start: 2020-03-29 — End: 2021-05-11

## 2020-03-29 NOTE — Telephone Encounter (Signed)
Last Visit:  09/29/2019     Next Visit Date:  Future Appointments   Date Time Provider [place]   04/01/2020 10:45 AM [name], [place] Maintenance   Topic Date Due   ??? Depression Screen  Never done   ??? DTaP/Tdap/Td vaccine (1 - Tdap) Never done   ??? Shingles Vaccine (1 of 2) Never done   ??? COVID-19 Vaccine (2 - Booster for Janssen series) 07/06/2019   ??? Diabetic retinal exam  08/12/2019   ??? Annual Wellness Visit (AWV)  09/28/2019   ??? Flu vaccine (1) Never done   ??? A1C test (Diabetic or Prediabetic)  09/28/2020   ??? Diabetic microalbuminuria test  09/28/2020   ??? Lipid screen  09/28/2020   ??? Potassium monitoring  11/27/2020   ??? Creatinine monitoring  11/27/2020   ??? Diabetic foot exam  01/25/2021   ??? Breast cancer screen  07/06/2021   ??? Colon cancer screen colonoscopy  04/24/2022   ??? DEXA (modify frequency per FRAX score)  Completed   ??? Pneumococcal 65+ years Vaccine  Completed   ??? Hepatitis C screen  Completed   ??? Hepatitis A vaccine  Aged Out   ??? Hib vaccine  Aged Out   ??? Meningococcal (ACWY) vaccine  Aged Out       Hemoglobin A1C (%)   Date Value   09/29/2019 6.4   04/03/2019 6.4   09/27/2018 6.5             ( goal A1C is < 7)   Microalb/Crt. Ratio (mcg/mg creat)   Date Value   09/29/2019 CANNOT BE CALCULATED     LDL Cholesterol (mg/dL)   Date Value   09/29/2019 111   04/03/2019 91     LDL Calculated (mg/dL)   Date Value   03/24/2016 143 (H)   09/29/2014 157       (goal LDL is <100)   AST (U/L)   Date Value   09/29/2019 29     ALT (U/L)   Date Value   09/29/2019 19     BUN (mg/dL)   Date Value   11/28/2019 21     BP Readings from Last 3 Encounters:   09/29/19 116/64   04/03/19 138/70   03/25/19 138/64          (goal 120/80)    All Future Testing planned in [facility]  Lab Frequency Next Occurrence               Patient Active Problem List:     Hyperlipidemia     HTN (hypertension)     OA (osteoarthritis)     Anxiety     Tubular adenoma     Dermatophytosis of nail      Benign neoplasm of skin     Diverticulosis of colon     Gastroesophageal reflux disease     History of gastric polyp     Controlled type 2 diabetes mellitus without complication, without long-term current use of insulin ([place])

## 2020-03-29 NOTE — Addendum Note (Signed)
Addended by: Joseph Pierini on: 03/29/2020 01:28 PM     Modules accepted: Orders

## 2020-04-01 ENCOUNTER — Inpatient Hospital Stay: Payer: MEDICARE | Primary: Adult Health

## 2020-04-01 ENCOUNTER — Ambulatory Visit: Admit: 2020-04-01 | Discharge: 2020-04-01 | Payer: MEDICARE | Attending: Adult Health | Primary: Adult Health

## 2020-04-01 DIAGNOSIS — E559 Vitamin D deficiency, unspecified: Secondary | ICD-10-CM

## 2020-04-01 DIAGNOSIS — E119 Type 2 diabetes mellitus without complications: Secondary | ICD-10-CM

## 2020-04-01 LAB — POCT GLYCOSYLATED HEMOGLOBIN (HGB A1C): Hemoglobin A1C: 6.3 %

## 2020-04-01 MED ORDER — ATORVASTATIN CALCIUM 10 MG PO TABS
10 MG | ORAL_TABLET | ORAL | 1 refills | Status: DC
Start: 2020-04-01 — End: 2020-09-28

## 2020-04-01 NOTE — Progress Notes (Signed)
Subjective:      Chief Complaint   Patient presents with   ??? Diabetes       Patient ID: Marissa Sawyer is a 70 y.o. female.    Diabetes  She presents for her follow-up diabetic visit. She has type 2 diabetes mellitus. Her disease course has been stable. There are no hypoglycemic associated symptoms. Pertinent negatives for hypoglycemia include no dizziness, headaches or pallor. Pertinent negatives for diabetes include no blurred vision, no chest pain, no fatigue, no foot paresthesias, no polydipsia, no polyuria and no weakness. There are no hypoglycemic complications. Symptoms are stable. There are no diabetic complications. Pertinent negatives for diabetic complications include no CVA, nephropathy or peripheral neuropathy. Risk factors for coronary artery disease include diabetes mellitus, dyslipidemia, hypertension, sedentary lifestyle, stress and post-menopausal. Current diabetic treatment includes diet and oral agent (monotherapy). She is compliant with treatment all of the time. Her weight is stable. She is following a generally healthy diet. Meal planning includes avoidance of concentrated sweets. She has had a previous visit with a dietitian. She participates in exercise intermittently. (140-150) An ACE inhibitor/angiotensin II receptor blocker is being taken. She sees a podiatrist.Eye exam is current ([facility]t [facility]1).   Hypertension  This is a chronic problem. The current episode started more than 1 year ago. The problem has been gradually improving since onset. The problem is controlled. Pertinent negatives include no anxiety, blurred vision, chest pain, headaches, neck pain, palpitations, peripheral edema or shortness of breath. There are no associated agents to hypertension. Risk factors for coronary artery disease include diabetes mellitus, post-menopausal state and sedentary lifestyle. Past treatments include ACE inhibitors. The current treatment provides significant improvement.  Compliance problems include diet and exercise.  There is no history of kidney disease, CAD/MI, CVA or heart failure. There is no history of chronic renal disease.   Hyperlipidemia  This is a chronic problem. The current episode started more than 1 year ago. The problem is controlled. Recent lipid tests were reviewed and are normal. Exacerbating diseases include diabetes. She has no history of chronic renal disease, hypothyroidism, liver disease, obesity or nephrotic syndrome. Factors aggravating her hyperlipidemia include fatty foods. Pertinent negatives include no chest pain, focal sensory loss, focal weakness, leg pain, myalgias or shortness of breath. Current antihyperlipidemic treatment includes statins (taking it every other day). The current treatment provides significant improvement of lipids. Compliance problems include adherence to diet and adherence to exercise.  Risk factors for coronary artery disease include hypertension, dyslipidemia, a sedentary lifestyle, post-menopausal and diabetes mellitus.     Past medical, surgical, social, and family history reviewed along with medications.     Review of Systems   Constitutional: Negative for appetite change, chills, diaphoresis, fatigue and fever.   Eyes: Negative for blurred vision.   Respiratory: Negative for cough, chest tightness, shortness of breath and wheezing.    Cardiovascular: Negative for chest pain, palpitations and leg swelling.   Gastrointestinal: Negative for abdominal pain, nausea and vomiting.   Endocrine: Negative for polydipsia and polyuria.   Musculoskeletal: Negative for myalgias and neck pain.   Skin: Negative for pallor and rash.   Neurological: Negative for dizziness, focal weakness, weakness, light-headedness and headaches.   All other systems reviewed and are negative.      Objective:   Physical Exam  Vitals and nursing note reviewed.   Constitutional:       General: She is not in acute distress.     Appearance: Normal  appearance. She  is well-developed, well-groomed and overweight. She is not ill-appearing, toxic-appearing or diaphoretic.   HENT:      Head: Normocephalic and atraumatic.   Cardiovascular:      Rate and Rhythm: Normal rate and regular rhythm.      Pulses: Normal pulses.      Heart sounds: Normal heart sounds. No murmur heard.      Pulmonary:      Effort: Pulmonary effort is normal.      Breath sounds: Normal breath sounds. No wheezing or rhonchi.   Musculoskeletal:      Right lower leg: No edema.      Left lower leg: No edema.   Skin:     General: Skin is warm and dry.   Neurological:      General: No focal deficit present.      Mental Status: She is alert and oriented to person, place, and time.      Gait: Gait normal.   Psychiatric:         Mood and Affect: Mood normal.         Behavior: Behavior normal.         Thought Content: Thought content normal.         Judgment: Judgment normal.             BP 118/66    Pulse 54    Resp (!) 98    Ht 5\' 4"  (1.626 m)    Wt 153 lb 13.6 oz (69.8 kg)    BMI 26.41 kg/m??     Assessment:       Diagnosis Orders   1. Controlled type 2 diabetes mellitus without complication, without long-term current use of insulin (HCC)  POCT glycosylated hemoglobin (Hb A1C)    CBC    Comprehensive Metabolic Panel    Lipid, Fasting   2. Essential hypertension     3. Vitamin D insufficiency  Vitamin D 25 Hydroxy   4. Pure hypercholesterolemia  atorvastatin (LIPITOR) 10 MG tablet           Plan:       a1c well controlled, 6.3  Blood work is fasting overnight, 10-12 hours. Advised the patient they can have water in the morning but no food or sugary drinks.   Will reduce down lipitor to 10mg  every other day, she states she wants to eventually get off of it. Lipid is well controlled  Medication: continue current meds and refills as above.   Follow up: 6 months and as needed.   Complete ordrs as above.   Spent 10 mins answering all questions.   Patient received counseling on the following healthy behaviors: diabetic  diet  Patient given educational materials per AVS  Discussed use, benefit, and side effects of prescribed medications. Barriers to medication compliance addressed. All patient questions answered. Pt voiced understanding.           [name], APRN - CNP

## 2020-04-01 NOTE — Progress Notes (Signed)
Patient in office for DM follow up.     Visit Information    Have you changed or started any medications since your last visit including any over-the-counter medicines, vitamins, or herbal medicines? no   Have you stopped taking any of your medications? Is so, why? -  no  Are you having any side effects from any of your medications? - no    Have you seen any other physician or provider since your last visit?  no   Have you had any other diagnostic tests since your last visit?  no   Have you been seen in the emergency room and/or had an admission in a hospital since we last saw you?  no   Have you had your routine dental cleaning in the past 6 months?  no     Do you have an active MyChart account? If no, what is the barrier?  Yes    Patient Care Team:  [name], APRN - CNP as PCP - General (Nurse Practitioner Adult Health)  [name], APRN - CNP as PCP - [facility] Empaneled Provider  Feliberto Gottron, MD as Consulting Physician (Gastroenterology)    Medical History Review  Past Medical, Family, and Social History reviewed and does contribute to the patient presenting condition    Health Maintenance   Topic Date Due   ??? Depression Screen  Never done   ??? DTaP/Tdap/Td vaccine (1 - Tdap) Never done   ??? Shingles Vaccine (1 of 2) Never done   ??? COVID-19 Vaccine (2 - Booster for Janssen series) 07/06/2019   ??? Diabetic retinal exam  08/12/2019   ??? Annual Wellness Visit (AWV)  09/28/2019   ??? Flu vaccine (1) Never done   ??? A1C test (Diabetic or Prediabetic)  09/28/2020   ??? Diabetic microalbuminuria test  09/28/2020   ??? Lipid screen  09/28/2020   ??? Potassium monitoring  11/27/2020   ??? Creatinine monitoring  11/27/2020   ??? Diabetic foot exam  01/25/2021   ??? Breast cancer screen  07/06/2021   ??? Colon cancer screen colonoscopy  04/24/2022   ??? DEXA (modify frequency per FRAX score)  Completed   ??? Pneumococcal 65+ years Vaccine  Completed   ??? Hepatitis C screen  Completed   ??? Hepatitis A vaccine  Aged Out   ??? Hib  vaccine  Aged Out   ??? Meningococcal (ACWY) vaccine  Aged Out

## 2020-04-01 NOTE — Patient Instructions (Signed)
Patient Education        Learning About Meal Planning for Diabetes  Why plan your meals?     Meal planning can be a key part of managing diabetes. Planning meals and snacks with the right balance of carbohydrate, protein, and fat can help you keep your blood sugar at the target level you set with your doctor.  You don't have to eat special foods. You can eat what your family eats, including sweets once in a while. But you do have to pay attention to how often you eat and how much you eat of certain foods.  You may want to work with a dietitian or a certified diabetes educator. He or she can give you tips and meal ideas and can answer your questions about meal planning. This health professional can also help you reach a healthy weight if that is one of your goals.  What plan is right for you?  Your dietitian or diabetes educator may suggest that you start with the plate format or carbohydrate counting.  The plate format  The plate format is a simple way to help you manage how you eat. You plan meals by learning how much space each food should take on a plate. Using the plate format helps you spread carbohydrate throughout the day. It can make it easier to keep your blood sugar level within your target range. It also helps you see if you're eating healthy portion sizes.  To use the plate format, you put non-starchy vegetables on half your plate. Add meat or meat substitutes on one-quarter of the plate. Put a grain or starchy vegetable (such as brown rice or a potato) on the final quarter of the plate. You can add a small piece of fruit and some low-fat or fat-free milk or yogurt, depending on your carbohydrate goal for each meal.  Here are some tips for using the plate format:  ?? Make sure that you are not using an oversized plate. A 9-inch plate is best. Many restaurants use larger plates.  ?? Get used to using the plate format at home. Then you can use it when you eat out.  ?? Write down your questions about using  the plate format. Talk to your doctor, a dietitian, or a diabetes educator about your concerns.  Carbohydrate counting  With carbohydrate counting, you plan meals based on the amount of carbohydrate in each food. Carbohydrate raises blood sugar higher and more quickly than any other nutrient. It is found in desserts, breads and cereals, and fruit. It's also found in starchy vegetables such as potatoes and corn, grains such as rice and pasta, and milk and yogurt. Spreading carbohydrate throughout the day helps keep your blood sugar levels within your target range.  Your daily amount depends on several things, including your weight, how active you are, which diabetes medicines you take, and what your goals are for your blood sugar levels. A registered dietitian or diabetes educator can help you plan how much carbohydrate to include in each meal and snack.  A guideline for your daily amount of carbohydrate is:  ?? 45 to 60 grams at each meal. That's about the same as 3 to 4 carbohydrate servings.  ?? 15 to 20 grams at each snack. That's about the same as 1 carbohydrate serving.  The Nutrition Facts label on packaged foods tells you how much carbohydrate is in a serving of the food. First, look at the serving size on the food label. Is that   the amount you eat in a serving? All of the nutrition information on a food label is based on that serving size. So if you eat more or less than that, you'll need to adjust the other numbers. Total carbohydrate is the next thing you need to look for on the label. If you count carbohydrate servings, one serving of carbohydrate is 15 grams.  For foods that don't come with labels, such as fresh fruits and vegetables, you'll need a guide that lists carbohydrate in these foods. Ask your doctor, dietitian, or diabetes educator about books or other nutrition guides you can use.  If you take insulin, you need to know how many grams of carbohydrate are in a meal. This lets you know how much  rapid-acting insulin to take before you eat. If you use an insulin pump, you get a constant rate of insulin during the day. So the pump must be programmed at meals to give you extra insulin to cover the rise in blood sugar after meals.  When you know how much carbohydrate you will eat, you can take the right amount of insulin. Or, if you always use the same amount of insulin, you need to make sure that you eat the same amount of carbohydrate at meals.  If you need more help to understand carbohydrate counting and food labels, ask your doctor, dietitian, or diabetes educator.  How can you plan healthy meals?  Here are some tips to get started:  ?? Plan your meals a week at a time. Don't forget to include snacks too.  ?? Use cookbooks or online recipes to plan several main meals. Plan some quick meals for busy nights. You also can double some recipes that freeze well. Then you can save half for other busy nights when you don't have time to cook.  ?? Make sure you have the ingredients you need for your recipes. If you're running low on basic items, put these items on your shopping list too.  ?? List foods that you use to make breakfasts, lunches, and snacks. List plenty of fruits and vegetables.  ?? Post this list on the refrigerator. Add to it as you think of more things you need.  ?? Take the list to the store to do your weekly shopping.  Follow-up care is a key part of your treatment and safety. Be sure to make and go to all appointments, and call your doctor if you are having problems. It's also a good idea to know your test results and keep a list of the medicines you take.  Where can you learn more?  Go to [email or web address] and sign in to your MyChart account. Enter X936 in the Search Health Information box to learn more about "Learning About Meal Planning for Diabetes."     If you do not have an account, please click on the "Sign Up Now" link.  Current as of: November 12, 2019??????????????????????????????Content Version: 13.1  ?? 2006-2021 [employer].   Care instructions adapted under license by [facility]. If you have questions about a medical condition or this instruction, always ask your healthcare professional. [employer] Inc[employer] disclaims any warranty or liability for your use of this information.

## 2020-04-02 LAB — COMPREHENSIVE METABOLIC PANEL
ALT: 20 U/L (ref 5–33)
AST: 24 U/L (ref <32)
Albumin/Globulin Ratio: 1.5 (ref 1.0–2.5)
Albumin: 4.7 g/dL (ref 3.5–5.2)
Alkaline Phosphatase: 84 U/L (ref 35–104)
Anion Gap: 17 mmol/L (ref 9–17)
BUN: 29 mg/dL — ABNORMAL HIGH (ref 8–23)
CO2: 22 mmol/L (ref 20–31)
Calcium: 10 mg/dL (ref 8.6–10.4)
Chloride: 102 mmol/L (ref 98–107)
Creatinine: 0.74 mg/dL (ref 0.50–0.90)
GFR African American: >60 mL/min (ref >60)
GFR Non-African American: >60 mL/min (ref >60)
Glucose: 113 mg/dL — ABNORMAL HIGH (ref 70–99)
Potassium: 5.2 mmol/L (ref 3.7–5.3)
Sodium: 141 mmol/L (ref 135–144)
Total Bilirubin: 0.32 mg/dL (ref 0.3–1.2)
Total Protein: 7.8 g/dL (ref 6.4–8.3)

## 2020-04-02 LAB — LIPID, FASTING
Chol/HDL Ratio: 4 (ref <5)
Cholesterol, Fasting: 172 mg/dL (ref <200)
HDL: 43 mg/dL (ref >40)
LDL Cholesterol: 105 mg/dL (ref 0–130)
Triglyceride, Fasting: 122 mg/dL (ref <150)

## 2020-04-02 LAB — VITAMIN D 25 HYDROXY: Vit D, 25-Hydroxy: 36.4 ng/mL (ref 30.0–100.0)

## 2020-04-02 LAB — CBC
Hematocrit: 40.9 % (ref 36.3–47.1)
Hemoglobin: 13.4 g/dL (ref 11.9–15.1)
MCH: 29.4 pg (ref 25.2–33.5)
MCHC: 32.8 g/dL (ref 28.4–34.8)
MCV: 89.7 fL (ref 82.6–102.9)
MPV: 10.8 fL (ref 8.1–13.5)
NRBC Automated: 0 per 100 WBC
Platelets: 271 10*3/uL (ref 138–453)
RBC: 4.56 m/uL (ref 3.95–5.11)
RDW: 13.1 % (ref 11.8–14.4)
WBC: 7.3 10*3/uL (ref 3.5–11.3)

## 2020-04-02 NOTE — Telephone Encounter (Signed)
From: Artelia Laroche  To: [name]  Sent: 04/01/2020 6:58 PM EST  Subject: Question regarding MICROALBUMIN, UR    Is that good

## 2020-05-31 ENCOUNTER — Encounter

## 2020-05-31 NOTE — Telephone Encounter (Signed)
Last seen 04/01/2020    Next Visit Date:  Future Appointments   Date Time Provider [employer]   09/29/2020 11:45 AM [name], [place] Maintenance   Topic Date Due   ??? DTaP/Tdap/Td vaccine (1 - Tdap) Never done   ??? Shingles Vaccine (1 of 2) Never done   ??? Diabetic retinal exam  08/12/2019   ??? Annual Wellness Visit (AWV)  09/28/2019   ??? COVID-19 Vaccine (2 - Booster for Janssen series) 09/29/2020 (Originally 07/06/2019)   ??? Flu vaccine (1) 04/01/2021 (Originally 11/05/2019)   ??? Diabetic microalbuminuria test  09/28/2020   ??? Diabetic foot exam  01/25/2021   ??? A1C test (Diabetic or Prediabetic)  04/01/2021   ??? Lipid screen  04/01/2021   ??? Depression Screen  04/01/2021   ??? Potassium monitoring  04/01/2021   ??? Creatinine monitoring  04/01/2021   ??? Breast cancer screen  07/06/2021   ??? Colorectal Cancer Screen  04/24/2022   ??? DEXA (modify frequency per FRAX score)  Completed   ??? Pneumococcal 65+ years Vaccine  Completed   ??? Hepatitis C screen  Completed   ??? Hepatitis A vaccine  Aged Out   ??? Hib vaccine  Aged Out   ??? Meningococcal (ACWY) vaccine  Aged Out       Hemoglobin A1C (%)   Date Value   04/01/2020 6.3   09/29/2019 6.4   04/03/2019 6.4             ( goal A1C is < 7)   Microalb/Crt. Ratio (mcg/mg creat)   Date Value   09/29/2019 CANNOT BE CALCULATED     LDL Cholesterol (mg/dL)   Date Value   04/01/2020 105     LDL Calculated (mg/dL)   Date Value   03/24/2016 143 (H)       (goal LDL is <100)   AST (U/L)   Date Value   04/01/2020 24     ALT (U/L)   Date Value   04/01/2020 20     BUN (mg/dL)   Date Value   04/01/2020 29 (H)     BP Readings from Last 3 Encounters:   04/01/20 118/66   09/29/19 116/64   04/03/19 138/70          (goal 120/80)    All Future Testing planned in [facility]  Lab Frequency Next Occurrence               Patient Active Problem List:     Hyperlipidemia     HTN (hypertension)     OA (osteoarthritis)     Anxiety     Tubular adenoma     Dermatophytosis of  nail     Benign neoplasm of skin     Diverticulosis of colon     Gastroesophageal reflux disease     History of gastric polyp     Controlled type 2 diabetes mellitus without complication, without long-term current use of insulin ([place])

## 2020-06-01 MED ORDER — OMEPRAZOLE 40 MG PO CPDR
40 MG | ORAL_CAPSULE | Freq: Every day | ORAL | 1 refills | Status: DC
Start: 2020-06-01 — End: 2021-06-15

## 2020-06-01 MED ORDER — METFORMIN HCL ER 500 MG PO TB24
500 MG | ORAL_TABLET | ORAL | 1 refills | Status: DC
Start: 2020-06-01 — End: 2021-01-03

## 2020-06-01 NOTE — Telephone Encounter (Signed)
Last seen 04/01/2020    Next Visit Date:  Future Appointments   Date Time Provider [place]   09/29/2020 11:45 AM [name], [place] Maintenance   Topic Date Due   ??? DTaP/Tdap/Td vaccine (1 - Tdap) Never done   ??? Shingles Vaccine (1 of 2) Never done   ??? Diabetic retinal exam  08/12/2019   ??? Annual Wellness Visit (AWV)  09/28/2019   ??? COVID-19 Vaccine (2 - Booster for Janssen series) 09/29/2020 (Originally 07/06/2019)   ??? Flu vaccine (1) 04/01/2021 (Originally 11/05/2019)   ??? Diabetic microalbuminuria test  09/28/2020   ??? Diabetic foot exam  01/25/2021   ??? A1C test (Diabetic or Prediabetic)  04/01/2021   ??? Lipid screen  04/01/2021   ??? Depression Screen  04/01/2021   ??? Potassium monitoring  04/01/2021   ??? Creatinine monitoring  04/01/2021   ??? Breast cancer screen  07/06/2021   ??? Colorectal Cancer Screen  04/24/2022   ??? DEXA (modify frequency per FRAX score)  Completed   ??? Pneumococcal 65+ years Vaccine  Completed   ??? Hepatitis C screen  Completed   ??? Hepatitis A vaccine  Aged Out   ??? Hib vaccine  Aged Out   ??? Meningococcal (ACWY) vaccine  Aged Out       Hemoglobin A1C (%)   Date Value   04/01/2020 6.3   09/29/2019 6.4   04/03/2019 6.4             ( goal A1C is < 7)   Microalb/Crt. Ratio (mcg/mg creat)   Date Value   09/29/2019 CANNOT BE CALCULATED     LDL Cholesterol (mg/dL)   Date Value   04/01/2020 105     LDL Calculated (mg/dL)   Date Value   03/24/2016 143 (H)       (goal LDL is <100)   AST (U/L)   Date Value   04/01/2020 24     ALT (U/L)   Date Value   04/01/2020 20     BUN (mg/dL)   Date Value   04/01/2020 29 (H)     BP Readings from Last 3 Encounters:   04/01/20 118/66   09/29/19 116/64   04/03/19 138/70          (goal 120/80)    All Future Testing planned in [facility]  Lab Frequency Next Occurrence               Patient Active Problem List:     Hyperlipidemia     HTN (hypertension)     OA (osteoarthritis)     Anxiety     Tubular adenoma     Dermatophytosis of  nail     Benign neoplasm of skin     Diverticulosis of colon     Gastroesophageal reflux disease     History of gastric polyp     Controlled type 2 diabetes mellitus without complication, without long-term current use of insulin ([place])

## 2020-06-02 ENCOUNTER — Inpatient Hospital Stay: Admit: 2020-06-02 | Payer: MEDICARE | Primary: Adult Health

## 2020-06-02 ENCOUNTER — Inpatient Hospital Stay: Payer: MEDICARE | Primary: Adult Health

## 2020-06-02 ENCOUNTER — Ambulatory Visit: Admit: 2020-06-02 | Discharge: 2020-06-02 | Payer: MEDICARE | Attending: Adult Health | Primary: Adult Health

## 2020-06-02 DIAGNOSIS — R109 Unspecified abdominal pain: Secondary | ICD-10-CM

## 2020-06-02 DIAGNOSIS — R1084 Generalized abdominal pain: Secondary | ICD-10-CM

## 2020-06-02 MED ORDER — PROBIOTIC ACIDOPHILUS PO TABS
ORAL_TABLET | Freq: Every day | ORAL | 5 refills | Status: AC
Start: 2020-06-02 — End: 2020-07-02

## 2020-06-02 MED ORDER — COLESTIPOL HCL 1 G PO TABS
1 | ORAL_TABLET | Freq: Two times a day (BID) | ORAL | 3 refills | Status: DC
Start: 2020-06-02 — End: 2021-09-01

## 2020-06-02 NOTE — Progress Notes (Signed)
Subjective:      Chief Complaint   Patient presents with   ??? Abdominal Pain       Patient ID: Marissa Sawyer is a 70 y.o. female.    Abdominal Pain  This is a chronic problem. The current episode started more than 1 year ago (worsening over the last week). The onset quality is undetermined. The problem occurs daily. The problem has been gradually worsening. The pain is located in the generalized abdominal region, epigastric region and right flank. The quality of the pain is burning. The abdominal pain does not radiate. Associated symptoms include diarrhea (sometimes its watery but she states it can be solid at times) and myalgias (muscle aching in the back). Pertinent negatives include no belching, constipation, dysuria, fever, nausea or vomiting. Exacerbated by: dairy products. The pain is relieved by bowel movements. Treatments tried: pepto. The treatment provided mild relief. Her past medical history is significant for abdominal surgery (+ chole) and GERD. + diverticulitis   she has been using omeprazole every other day. She states she hasnt paid attention to see if it makes her feel better.    She has seen Dr. Towanda Sawyer in the past. Most recently 2017 - EDG completed. Last colo was in 2014. Tubular adenoma noted.   + gerd history  + diverticulosis noted    Past medical, surgical, social, and family history reviewed along with medications.     Review of Systems   Constitutional: Negative for appetite change, chills, diaphoresis, fatigue and fever.   Gastrointestinal: Positive for abdominal distention (bloating), abdominal pain and diarrhea (sometimes its watery but she states it can be solid at times). Negative for anal bleeding, blood in stool, constipation, nausea, rectal pain and vomiting.   Genitourinary: Positive for flank pain. Negative for decreased urine volume, difficulty urinating, dysuria, pelvic pain and urgency.   Musculoskeletal: Positive for back pain and myalgias (muscle aching in the back).   All  other systems reviewed and are negative.      Objective:   Physical Exam  Vitals and nursing note reviewed.   Constitutional:       General: She is not in acute distress.     Appearance: Normal appearance. She is normal weight. She is not ill-appearing, toxic-appearing or diaphoretic.   HENT:      Head: Normocephalic and atraumatic.   Pulmonary:      Effort: Pulmonary effort is normal.   Abdominal:      General: Abdomen is flat. Bowel sounds are normal. There is no distension.      Palpations: Abdomen is soft. There is no mass.      Tenderness: There is no abdominal tenderness. There is no right CVA tenderness, left CVA tenderness, guarding or rebound.   Skin:     General: Skin is warm.   Neurological:      General: No focal deficit present.      Mental Status: She is alert and oriented to person, place, and time.   Psychiatric:         Mood and Affect: Mood normal.         Behavior: Behavior normal.         Thought Content: Thought content normal.             BP (!) 146/60    Pulse 62    Ht 5\' 4"  (1.626 m)    Wt 156 lb 9.6 oz (71 kg)    SpO2 98%    BMI 26.88  kg/m??     Assessment:       Diagnosis Orders   1. Generalized abdominal pain  CBC    Comprehensive Metabolic Panel    Lipase    Probiotic Acidophilus [facility]) TABS    Marissa Sawyer, [place], MD, Gastroenterology, [place]   2. Right flank pain  XR ABDOMEN (KUB) (SINGLE AP VIEW)    Urinalysis with Reflex to Culture   3. Loose stools  colestipol (COLESTID) 1 g tablet    [place] - Sawyer, [place], MD, Gastroenterology, [place]   4. Tubular adenoma  [place] - Sawyer, Marissa Counts, MD, Gastroenterology, [place]   5. Gastroesophageal reflux disease, unspecified whether esophagitis present   - Marissa Sawyer, [place], MD, Gastroenterology, Marissa Sawyer           Plan:      Urine, labs and xray ordered  Colestid ordered to help with bowel movements  Probiotic ordered.  Referral back to GI placed  Start food diary   It is okay for her to start omeprazole daily to see if the  symptoms while she is waiting to follow up with GI.  We will contact with results and further instructions.  Patient verbalized understanding of all  RTC if symptoms fail to improve or worsen  Keep all routine follow up appointments          [name], APRN - CNP

## 2020-06-02 NOTE — Progress Notes (Signed)
Patient in office for abd pain and bloating radiates to back. Started this past week.      Visit Information    Have you changed or started any medications since your last visit including any over-the-counter medicines, vitamins, or herbal medicines? no   Have you stopped taking any of your medications? Is so, why? -  no  Are you having any side effects from any of your medications? - no    Have you seen any other physician or provider since your last visit?  no   Have you had any other diagnostic tests since your last visit?  no   Have you been seen in the emergency room and/or had an admission in a hospital since we last saw you?  no   Have you had your routine dental cleaning in the past 6 months?  no     Do you have an active MyChart account? If no, what is the barrier?  Yes    Patient Care Team:  [name], APRN - CNP as PCP - General (Nurse Practitioner Adult Health)  [name], APRN - CNP as PCP - [facility] Empaneled Provider  Feliberto Gottron, MD as Consulting Physician (Gastroenterology)    Medical History Review  Past Medical, Family, and Social History reviewed and does not contribute to the patient presenting condition    Health Maintenance   Topic Date Due   ??? DTaP/Tdap/Td vaccine (1 - Tdap) Never done   ??? Shingles Vaccine (1 of 2) Never done   ??? Diabetic retinal exam  08/12/2019   ??? Annual Wellness Visit (AWV)  09/28/2019   ??? COVID-19 Vaccine (2 - Booster for Janssen series) 09/29/2020 (Originally 07/06/2019)   ??? Flu vaccine (1) 04/01/2021 (Originally 11/05/2019)   ??? Diabetic microalbuminuria test  09/28/2020   ??? Diabetic foot exam  01/25/2021   ??? A1C test (Diabetic or Prediabetic)  04/01/2021   ??? Lipid screen  04/01/2021   ??? Depression Screen  04/01/2021   ??? Potassium monitoring  04/01/2021   ??? Creatinine monitoring  04/01/2021   ??? Breast cancer screen  07/06/2021   ??? Colorectal Cancer Screen  04/24/2022   ??? DEXA (modify frequency per FRAX score)  Completed   ??? Pneumococcal 65+ years  Vaccine  Completed   ??? Hepatitis C screen  Completed   ??? Hepatitis A vaccine  Aged Out   ??? Hib vaccine  Aged Out   ??? Meningococcal (ACWY) vaccine  Aged Out

## 2020-06-02 NOTE — Patient Instructions (Signed)
Patient Education        Gastroesophageal Reflux Disease (GERD): Care Instructions  Overview     Gastroesophageal reflux disease (GERD) is the backward flow of stomach acid into the esophagus. The esophagus is the tube that leads from your throat to your stomach. A one-way valve prevents the stomach acid from backing up into this tube. But when you have GERD, this valve does not close tightly enough. This can also cause pain and swelling in your esophagus. (This is calledesophagitis.)  If you have mild GERD symptoms including heartburn, you may be able to control the problem with antacids or over-the-counter medicine. You can also make lifestyle changes to help reduce your symptoms. These include changing yourdiet and eating habits, such as not eating late at night and losing weight.  Follow-up care is a key part of your treatment and safety. Be sure to make and go to all appointments, and call your doctor if you are having problems. It's also a good idea to know your test results and keep alist of the medicines you take.  How can you care for yourself at home?  ??? Take your medicines exactly as prescribed. Call your doctor if you think you are having a problem with your medicine.  ??? Your doctor may recommend over-the-counter medicine. For mild or occasional indigestion, antacids, such as Tums, Gaviscon, Mylanta, or Maalox, may help. Your doctor also may recommend over-the-counter acid reducers, such as famotidine (Pepcid AC), cimetidine (Tagamet HB), or omeprazole (Prilosec). Read and follow all instructions on the label. If you use these medicines often, talk with your doctor.  ??? Change your eating habits.  ? It's best to eat several small meals instead of two or three large meals.  ? After you eat, wait 2 to 3 hours before you lie down.  ? Avoid foods that make your symptoms worse. These may include chocolate, mint, alcohol, pepper, spicy foods, high-fat foods, or drinks with caffeine in them, such as tea,  coffee, colas, or energy drinks. If your symptoms are worse after you eat a certain food, you may want to stop eating it to see if your symptoms get better.  ??? Do not smoke or chew tobacco. Smoking can make GERD worse. If you need help quitting, talk to your doctor about stop-smoking programs and medicines. These can increase your chances of quitting for good.  ??? If you have GERD symptoms at night, raise the head of your bed 6 to 8 inches by putting the frame on blocks or placing a foam wedge under the head of your mattress. (Adding extra pillows does not work.)  ??? Do not wear tight clothing around your middle.  ??? Lose weight if you need to. Losing just 5 to 10 pounds can help.  When should you call for help?   Call your doctor now or seek immediate medical care if:  ?? ??? You have new or different belly pain.   ?? ??? Your stools are black and tarlike or have streaks of blood.   Watch closely for changes in your health, and be sure to contact your doctor if:  ?? ??? Your symptoms have not improved after 2 days.   ?? ??? Food seems to catch in your throat or chest.   Where can you learn more?  Go to [email or web address] and sign in to your MyChart account. Enter T927 in the Search Health Information box to learn more about "Gastroesophageal Reflux Disease (GERD): Care Instructions."       If you do not have an account, please click on the "Sign Up Now" link.  Current as of: November 12, 2019??????????????????????????????Content Version: 13.2  ?? 2006-2022 Healthwise, Incorporated.   Care instructions adapted under license by [facility]. If you have questions about a medical condition or this instruction, always ask your healthcare professional. [place] any warranty or liability for your use of this information.         Patient Education        Abdominal Pain: Care Instructions  Your Care Instructions     Abdominal pain has many possible causes. Some aren't serious and get better on their own in a  few days. Others need more testing and treatment. If your pain continues or gets worse, you need to be rechecked and may need more tests tofind out what is wrong. You may need surgery to correct the problem.  Don't ignore new symptoms, such as fever, nausea and vomiting, urination problems, pain that gets worse, and dizziness. These may be signs of a moreserious problem.  Your doctor may have recommended a follow-up visit in the next 8 to 12 hours.If you are not getting better, you may need more tests or treatment.  The doctor has checked you carefully, but problems can develop later. If you notice any problems or new symptoms, get medical treatment right away.  Follow-up care is a key part of your treatment and safety. Be sure to make and go to all appointments, and call your doctor if you are having problems. It's also a good idea to know your test results and keep alist of the medicines you take.  How can you care for yourself at home?  ??? Rest until you feel better.  ??? To prevent dehydration, drink plenty of fluids. Choose water and other clear liquids until you feel better. If you have kidney, heart, or liver disease and have to limit fluids, talk with your doctor before you increase the amount of fluids you drink.  ??? If your stomach is upset, eat mild foods, such as rice, dry toast or crackers, bananas, and applesauce. Try eating several small meals instead of two or three large ones.  ??? Wait until 48 hours after all symptoms have gone away before you have spicy foods, alcohol, and drinks that contain caffeine.  ??? Do not eat foods that are high in fat.  ??? Avoid anti-inflammatory medicines such as aspirin, ibuprofen (Advil, Motrin), and naproxen (Aleve). These can cause stomach upset. Talk to your doctor if you take daily aspirin for another health problem.  When should you call for help?   Call 911 anytime you think you may need emergency care. For example, call if:  ?? ??? You passed out (lost consciousness).   ??  ??? You pass maroon or very bloody stools.   ?? ??? You vomit blood or what looks like coffee grounds.   ?? ??? You have new, severe belly pain.   Call your doctor now or seek immediate medical care if:  ?? ??? Your pain gets worse, especially if it becomes focused in one area of your belly.   ?? ??? You have a new or higher fever.   ?? ??? Your stools are black and look like tar, or they have streaks of blood.   ?? ??? You have unexpected vaginal bleeding.   ?? ??? You have symptoms of a urinary tract infection. These may include:  ? Pain when you urinate.  ? Urinating more often  than usual.  ? Blood in your urine.   ?? ??? You are dizzy or lightheaded, or you feel like you may faint.   Watch closely for changes in your health, and be sure to contact your doctor if:  ?? ??? You are not getting better after 1 day (24 hours).   Where can you learn more?  Go to [email or web address] and sign in to your MyChart account. Enter [phone] in the [place] box to learn more about "Abdominal Pain: Care Instructions."     If you do not have an account, please click on the "Sign Up Now" link.  Current as of: September 04, 2019??????????????????????????????Content Version: 13.2  ?? 2006-2022 Healthwise, Incorporated.   Care instructions adapted under license by [facility]. If you have questions about a medical condition or this instruction, always ask your healthcare professional. [place] any warranty or liability for your use of this information.

## 2020-06-03 LAB — URINALYSIS WITH REFLEX TO CULTURE
Bilirubin Urine: NEGATIVE
Glucose, Ur: NEGATIVE
Ketones, Urine: NEGATIVE
Leukocyte Esterase, Urine: NEGATIVE
Nitrite, Urine: NEGATIVE
Protein, UA: NEGATIVE
Specific Gravity, UA: 1.02 (ref 1.005–1.030)
Urine Hgb: NEGATIVE
Urobilinogen, Urine: NORMAL
pH, UA: 5 (ref 5.0–8.0)

## 2020-06-03 LAB — COMPREHENSIVE METABOLIC PANEL
ALT: 19 U/L (ref 5–33)
AST: 26 U/L (ref <32)
Albumin/Globulin Ratio: 1.3 (ref 1.0–2.5)
Albumin: 4.4 g/dL (ref 3.5–5.2)
Alkaline Phosphatase: 76 U/L (ref 35–104)
Anion Gap: 14 mmol/L (ref 9–17)
BUN: 24 mg/dL — ABNORMAL HIGH (ref 8–23)
CO2: 23 mmol/L (ref 20–31)
Calcium: 9.3 mg/dL (ref 8.6–10.4)
Chloride: 105 mmol/L (ref 98–107)
Creatinine: 0.8 mg/dL (ref 0.50–0.90)
GFR African American: >60 mL/min (ref >60)
GFR Non-African American: >60 mL/min (ref >60)
Glucose: 137 mg/dL — ABNORMAL HIGH (ref 70–99)
Potassium: 4.6 mmol/L (ref 3.7–5.3)
Sodium: 142 mmol/L (ref 135–144)
Total Bilirubin: 0.34 mg/dL (ref 0.3–1.2)
Total Protein: 7.9 g/dL (ref 6.4–8.3)

## 2020-06-03 LAB — CBC
Hematocrit: 41.1 % (ref 36.3–47.1)
Hemoglobin: 12.8 g/dL (ref 11.9–15.1)
MCH: 28.7 pg (ref 25.2–33.5)
MCHC: 31.1 g/dL (ref 28.4–34.8)
MCV: 92.2 fL (ref 82.6–102.9)
MPV: 10.6 fL (ref 8.1–13.5)
NRBC Automated: 0 per 100 WBC
Platelets: 274 10*3/uL (ref 138–453)
RBC: 4.46 m/uL (ref 3.95–5.11)
RDW: 13.3 % (ref 11.8–14.4)
WBC: 6.5 10*3/uL (ref 3.5–11.3)

## 2020-06-03 LAB — MICROSCOPIC URINALYSIS
Casts UA: 10 /LPF (ref 0–2)
Epithelial Cells UA: 5 /HPF (ref 0–5)
RBC, UA: 0 /HPF (ref 0–2)
WBC, UA: 2 /HPF (ref 0–5)

## 2020-06-03 LAB — LIPASE: Lipase: 31 U/L (ref 13–60)

## 2020-06-04 ENCOUNTER — Encounter

## 2020-06-16 ENCOUNTER — Encounter

## 2020-06-16 ENCOUNTER — Inpatient Hospital Stay: Admit: 2020-06-16 | Payer: MEDICARE | Primary: Adult Health

## 2020-06-16 DIAGNOSIS — N201 Calculus of ureter: Secondary | ICD-10-CM

## 2020-06-17 NOTE — Telephone Encounter (Signed)
From: Artelia Laroche  To: [name]  Sent: 06/16/2020 12:50 PM EDT  Subject: Question regarding CT Abdomen Pelvis Without Contrast    So what does that exactly mean no kidney stones and what about the colon

## 2020-06-30 ENCOUNTER — Encounter: Payer: MEDICARE | Attending: Family | Primary: Adult Health

## 2020-07-01 NOTE — Telephone Encounter (Signed)
Est pt last seen 2017- Generalized abdominal pain- [employer] ins. Scheduled appt for may 4th

## 2020-07-06 ENCOUNTER — Inpatient Hospital Stay: Payer: MEDICARE | Primary: Adult Health

## 2020-07-06 ENCOUNTER — Ambulatory Visit: Admit: 2020-07-06 | Discharge: 2020-07-06 | Payer: MEDICARE | Attending: Gastroenterology | Primary: Adult Health

## 2020-07-06 DIAGNOSIS — R194 Change in bowel habit: Secondary | ICD-10-CM

## 2020-07-06 DIAGNOSIS — K219 Gastro-esophageal reflux disease without esophagitis: Secondary | ICD-10-CM

## 2020-07-06 LAB — C-REACTIVE PROTEIN: CRP: <3 mg/L (ref 0.0–5.0)

## 2020-07-06 LAB — TSH WITH REFLEX: TSH: 1.06 u[IU]/mL (ref 0.30–5.00)

## 2020-07-06 MED ORDER — POLYETHYLENE GLYCOL 3350 17 GM/SCOOP PO POWD
17 GM/SCOOP | ORAL | 0 refills | Status: AC
Start: 2020-07-06 — End: 2021-03-28

## 2020-07-06 MED ORDER — BISACODYL EC 5 MG PO TBEC
5 MG | ORAL_TABLET | ORAL | 0 refills | Status: AC
Start: 2020-07-06 — End: 2021-09-01
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
## 2020-07-07 LAB — TISSUE TRANSGLUTAMINASE, IGA: Tissue Transglutaminase IgA: 1.2 U/mL (ref <7.0)

## 2020-07-07 NOTE — Telephone Encounter (Signed)
From: Artelia Laroche  To: [name]  Sent: 07/07/2020 9:33 AM EDT  Subject: Question regarding C-REACTIVE PROTEIN    So what does this mean

## 2020-07-08 NOTE — Telephone Encounter (Signed)
From: Artelia Laroche  To: [name]  Sent: 07/07/2020 6:27 PM EDT  Subject: Question regarding TISSUE TRANSGLUTAMINASE IgA    Hi what???s this mean thank you

## 2020-07-10 ENCOUNTER — Inpatient Hospital Stay: Payer: MEDICARE | Primary: Adult Health

## 2020-07-10 DIAGNOSIS — R194 Change in bowel habit: Secondary | ICD-10-CM

## 2020-07-12 LAB — GIARDIA ANTIGEN: Direct Exam: NEGATIVE

## 2020-07-12 NOTE — Telephone Encounter (Signed)
From: Artelia Laroche  To: Dr. Elvina Mattes Pangulur  Sent: 07/12/2020 8:22 AM EDT  Subject: Question regarding GIARDIA ANTIGEN    So it was good and what is giardia antigen thank you

## 2020-07-14 ENCOUNTER — Inpatient Hospital Stay: Admit: 2020-07-14 | Payer: MEDICARE | Primary: Adult Health

## 2020-07-14 LAB — PANCREATIC ELASTASE, FECAL: Fecal Pancreatic Elastase-1: >800 ug/g (ref >=100)

## 2020-07-14 NOTE — Telephone Encounter (Signed)
Pt called and lvm stating she misplaced her paperwork and would like them resent.

## 2020-07-14 NOTE — Progress Notes (Signed)
Pre-op Instructions For Out-Patient Surgery    Medication Instructions:  ?? Please stop herbs and any supplements now (includes vitamins and minerals).    ?? Please contact your surgeon and prescribing physician for pre-op instructions for any blood thinners.     ?? If you have inhalers/aerosol treatments at home, please use them the morning of your surgery and bring the inhalers with you to the hospital.    ?? Please take the following medications the morning of your surgery with a sip of water:    Lisinopril     Surgery Instructions:  1. After midnight before surgery:  Do not eat or drink anything, including water, mints, gum, and hard candy.  You may brush your teeth without swallowing.  No smoking, chewing tobacco, or street drugs.    2. Please shower or bathe before surgery.      3. Please do not wear any cologne, lotion, powder, deodorant, jewelry, piercings, perfume, makeup, nail polish, hair accessories, or hair spray on the day of surgery.  Wear loose comfortable clothing.    4. Leave your valuables at home.  Bring a storage case for any glasses/contacts.    5. An adult who is responsible for you MUST drive you home and should be with you for the first 24 hours after surgery.     6. If having out-patient knee and foot surgeries, please arrange for planned crutches, walker, or wheelchair before arriving to the hospital.    The Day of Surgery:  ?? Arrive at [facility]tr[facility] at the time directed by your surgeon and check in at the desk.     ?? If you have a living will or healthcare power of attorney, please bring a copy.    ?? You will be taken to the pre-op holding area where you will be prepared for surgery.  A physical assessment will be performed by a nurse practitioner or house officer.  Your IV will be started and you will meet your anesthesiologist.    ?? When you go to surgery, your family will be directed to the surgical waiting room, where the doctor  should speak with them after your surgery.    ?? After surgery, you will be taken to the recovery room then when you are awake and stable you will go to the short stay unit for preparation to be discharged.     ?? If you use a Bi-PAP or C-PAP machine, please bring it with you and leave it in the car in case it is needed in recovery room.    Instructions read to [facility] and understanding verbalized.     07/28/20 EGD & Colonoscopy

## 2020-07-14 NOTE — Telephone Encounter (Signed)
Called pt back; informed her that instructions have been sent via mychart.

## 2020-07-28 ENCOUNTER — Inpatient Hospital Stay: Payer: MEDICARE

## 2020-07-28 LAB — POC GLUCOSE FINGERSTICK: POC Glucose: 121 mg/dL — ABNORMAL HIGH (ref 65–105)

## 2020-07-28 MED ORDER — SODIUM CHLORIDE 0.9 % IV SOLN
0.9 | INTRAVENOUS | Status: DC | PRN
Start: 2020-07-28 — End: 2020-07-28

## 2020-07-28 MED ORDER — LIDOCAINE HCL 2 % IJ SOLN
2 % | INTRAMUSCULAR | Status: DC | PRN
Start: 2020-07-28 — End: 2020-07-28
  Administered 2020-07-28: 14:00:00 100 via INTRAVENOUS

## 2020-07-28 MED ORDER — PROPOFOL 200 MG/20ML IV EMUL
200 MG/20ML | INTRAVENOUS | Status: DC | PRN
Start: 2020-07-28 — End: 2020-07-28
  Administered 2020-07-28: 14:00:00 via INTRAVENOUS

## 2020-07-28 MED ORDER — NORMAL SALINE FLUSH 0.9 % IV SOLN
0.9 % | Freq: Two times a day (BID) | INTRAVENOUS | Status: DC
Start: 2020-07-28 — End: 2020-07-28

## 2020-07-28 MED ORDER — PROPOFOL 200 MG/20ML IV EMUL
200 MG/20ML | INTRAVENOUS | Status: DC | PRN
Start: 2020-07-28 — End: 2020-07-28
  Administered 2020-07-28: 14:00:00 150 via INTRAVENOUS

## 2020-07-28 MED ORDER — SODIUM CHLORIDE 0.9 % IV SOLN
0.9 % | INTRAVENOUS | Status: DC
Start: 2020-07-28 — End: 2020-07-28
  Administered 2020-07-28: 12:00:00 via INTRAVENOUS

## 2020-07-28 MED ORDER — NORMAL SALINE FLUSH 0.9 % IV SOLN
0.9 % | INTRAVENOUS | Status: DC | PRN
Start: 2020-07-28 — End: 2020-07-28

## 2020-07-28 MED ORDER — LIDOCAINE HCL (PF) 1 % IJ SOLN
1 % | Freq: Once | INTRAMUSCULAR | Status: DC | PRN
Start: 2020-07-28 — End: 2020-07-28

## 2020-07-28 NOTE — Anesthesia Post-Procedure Evaluation (Signed)
POST- ANESTHESIA EVALUATION       Pt Name: Marissa Sawyer  MRN: [id number]  Birthdate: Oct 26, 1950  Date of evaluation: 07/28/2020  Time:  11:32 AM      BP (!) 108/58    Pulse 56    Temp 97 ??F (36.1 ??C) (Infrared)    Resp 16    Ht 5\' 4"  (1.626 m)    Wt 154 lb (69.9 kg)    SpO2 100%    BMI 26.43 kg/m??      Consciousness Level  Awake  Cardiopulmonary Status  Stable  Pain Adequately Treated YES  Nausea / Vomiting  NO  Adequate Hydration  YES  Anesthesia Related Complications NONE      Electronically signed by Bennye Alm, MD on 07/28/2020 at 11:32 AM       Department of Anesthesiology  Postprocedure Note    Patient: Marissa Sawyer  MRN: [id number]  Birthdate: 01/25/1951  Date of evaluation: 07/28/2020  Time:  11:32 AM     Procedure Summary     Date: 07/28/20 Room / Location: [facility] / [facility]    Anesthesia Start: 1007 Anesthesia Stop: 6073    Procedures:       EGD BIOPSY OF ESOPHAGUS (N/A Esophagus)      COLONOSCOPY WITH RANDOM COLON BIOPSY AND SIGMOID POLYP REMOVAL WITH SNARE (N/A ) Diagnosis: (BOWEL HABIT CHANGES / DYSPEPSIA)    Surgeons: Feliberto Gottron, MD Responsible Provider: Bennye Alm, MD    Anesthesia Type: general ASA Status: 2          Anesthesia Type: No value filed.    Aldrete Phase I:      Aldrete Phase II: Aldrete Score: 5    Last vitals: Reviewed and per EMR flowsheets.       Anesthesia Post Evaluation
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
## 2020-07-28 NOTE — Op Note (Signed)
ESOPHAGOGASTRODUODENOSCOPY   ( EGD )  DATE OF PROCEDURE: 07/28/2020     SURGEON: Jane Canary, MD    ASSISTANT: None    PREOPERATIVE DIAGNOSIS: History of chronic GERD and dyspepsia.  Procedure performed evaluate upper GI lesions    POSTOPERATIVE DIAGNOSIS: No abnormality seen up to the end of second part of the duodenum    OPERATION: Upper GI endoscopy with Biopsy    ANESTHESIA: MAC    ESTIMATED BLOOD LOSS: None    COMPLICATIONS: None.     SPECIMENS:  Was Obtained: Multiple random biopsies from the esophagus to evaluate microscopic esophagitis    HISTORY: The patient is a 70 y.o. year old female with history of above preop diagnosis.  I recommended esophagogastroduodenoscopy with possible biopsy and I explained the risk, benefits, expected outcome, and alternatives to the procedure.  Risks included but are not limited to bleeding, infection, respiratory distress, hypotension, and perforation of the esophagus, stomach, or duodenum.  Patient understands and is in agreement.      PROCEDURE: The patient was given IV conscious sedation.  The patient's SPO2 remained above 90% throughout the procedure. Cetacaine spray given.  Patient placed in left lateral position.  Olympus GIF 160 videogastroscope was inserted orally under vision into the esophagus without difficulty and advanced into the stomach then through the pylorus up to the second part of duodenum.      Findings:    Retropharyngeal area was grossly normal appearing    Esophagus: normal.  No signs of peptic esophagitis or Barrett's mucosa seen.  Squamocolumnar junction at about 35 cm and lower esophageal sphincter opens normally no hiatal hernia  Multiple random biopsies taken from the esophagus to rule out microscopic esophagitis     stomach:    Fundus and Cardia Examined in Retroflexed View: normal    Body: normal    Antrum: normal    Duodenum:     Descending: normal    Bulb: normal    While withdrawing the scope the above findings were verified and the  scope was removed.  The patient has tolerated the procedure without unusual events.         Recommendations/Plan:   1. F/U Biopsies  2. F/U In Office as instructed  3. Discussed with the family                   Electronically signed by Jane Canary, MD  on 07/28/2020 at 10:17 AM

## 2020-07-28 NOTE — Op Note (Signed)
COLONOSCOPY    DATE OF PROCEDURE: 07/28/2020    SURGEON: Jane Canary, MD    ASSISTANT: None    PREOPERATIVE DIAGNOSIS: Patient has bowel habit change.  Procedure for prompt evaluate colonic lesions    POSTOPERATIVE DIAGNOSIS: Polyp sigmoid colon.  Diverticulosis    OPERATION: Total colonoscopy, snare polypectomy sigmoid colon, random biopsies from the colon to evaluate microscopic colitis    ANESTHESIA: MAC    ESTIMATED BLOOD LOSS: None    COMPLICATIONS: None     SPECIMENS:  Was Obtained: Polyp sigmoid colon    Random biopsies from the right, transverse, left colon to evaluate microscopic colitis    HISTORY: The patient is a 70 y.o. year old female with history of above preop diagnosis.  I recommended colonoscopy with possible biopsy or polypectomy and I explained the risk, benefits, expected outcome, and alternatives to the procedure.  Risks included but are not limited to bleeding, infection, respiratory distress, hypotension, and perforation of the colon and possibility of missing a lesion.  The patient understands and is in agreement.      PROCEDURE:  The patient's SPO2 remained above 90% throughout the procedure. Digital rectal exam was normal.  The colonoscope was inserted through the anus into the rectum and advanced under direct vision to the cecum without difficulty.  Terminal ileum was examined for approximately 2 inches.  The prep was good.      Findings:  Terminal ileum: normal    Cecum/Ascending colon: normal, also examined in the retroflexed view    Transverse colon: normal    Descending/Sigmoid colon: abnormal: Has a diverticulosis      5 to 7 mm flat polyp seen at about 40 cm from the anus, removed with cold snare       Rectum: Normal.  Also examined in the retroflexed view.        The colon was decompressed and the scope was removed.  The patient tolerated the procedure without unusual events.     During the procedure, the patient's blood pressure, pulse and oxygen saturation remained stable  and documented. No unusual events occurred during the procedure. Patient was transferred to recovery room and will be discharged when criteria is met.     Recommendations/Plan:   1. F/U Biopsies  2. F/U In Office as instructed  3. Discussed with the family  4. High fiber diet   5. Precautions to avoid constipation     Next colonoscopy: 5 years.  If Colonoscopy is less than 10 years the recommended reason is [id number]    Electronically signed by Jane Canary, MD  on 07/28/2020 at 10:19 AM

## 2020-07-28 NOTE — Progress Notes (Signed)
Blood sugar 140 this am

## 2020-07-28 NOTE — Discharge Instructions (Addendum)
Good nutrition is important when healing from an illness, injury, or surgery.  Follow any nutrition recommendations given to you during your hospital stay.   If you were given an oral nutrition supplement while in the hospital, continue to take this supplement at home.  You can take it with meals, in-between meals, and/or before bedtime. These supplements can be purchased at most local grocery stores, pharmacies, and chain super-stores.   If you have any questions about your diet or nutrition, call the hospital and ask for the dietitian.  High-Fiber Diet     What Is Fiber?   Dietary fiber is a form of carbohydrate found in plants that cannot be digested by humans. All plants contain fiber, including fruits, vegetables, grains, and legumes. Fiber is often classified into two categories: soluble and insoluble.   Soluble fiber draws water into the bowel and can help slow digestion. Examples of foods that are high in soluble fiber include oatmeal, oat bran, barley, legumes (eg, beans and peas), apples, and strawberries.   Insoluble fiber speeds digestion and can add bulk to the stool. Examples of foods that are high in insoluble fiber include whole-wheat products, wheat bran, cauliflower, green beans, and potatoes.   Why Follow a High-Fiber Diet?   A high-fiber diet is often recommended to prevent and treat constipation , hemorrhoids , diverticulitis , and irritable bowel syndrome . Eating a high-fiber diet can also help improve your cholesterol levels, lower your risk of coronary heart disease , reduce your risk of type 2 diabetes , and lower your weight. For people with type 1 or 2 diabetes, a high-fiber diet can also help stabilize blood sugar levels.   How Much Fiber Should I Eat?   A high-fiber diet should contain  20-35 grams  of fiber a day. This is actually the amount recommended for the general adult population; however, most Americans eat only 15 grams of fiber per day.   Digestion of Fiber   Eating a higher  fiber diet than usual can take some getting used to by your body's digestive system. To avoid the side effects of sudden increases in dietary fiber (eg, gas, cramping, bloating, and diarrhea), increase fiber gradually and be sure to drink plenty of fluids every day.   Tips for Increasing Fiber Intake   Whenever possible, choose whole grains over refined grains (eg, brown rice instead of white rice, whole-wheat bread instead of white bread).    Include a variety of grains in your diet, such as wheat, rye, barley, oats, quinoa, and bulgur.    Eat more vegetarian-based meals. Here are some ideas: black bean burgers, eggplant lasagna, and veggie tofu stir-fry.    Choose high-fiber snacks, such as fruits, popcorn, whole-grain crackers, and nuts.    Make whole-grain cereal or whole-grain toast part of your daily breakfast regime.    When eating out, whether ordering a sandwich or dinner, ask for extra vegetables.    When baking, replace part of the white flour with whole-wheat flour. Whole-wheat flour is particularly easy to incorporate into a recipe.    High-Fiber Diet Eating Guide   Food Category   Foods Recommended   Notes   Grains   Whole-grain breads, muffins, bagels, or pita bread Rye bread Whole-wheat crackers or crisp breads Whole-grain or bran cereals Oatmeal, oat bran, or grits Wheat germ Whole-wheat pasta and brown rice   Read the ingredients list on food labels. Look for products that list "whole" as the first ingredient (eg, whole-wheat,   whole oats). Choose cereals with at least 2 grams of fiber per serving.   Vegetables   All vegetables, especially asparagus, bean sprouts, broccoli, Brussels sprouts, cabbage, carrots, cauliflower, celery, corn, greens, green beans, green pepper, onions, peas, potatoes (with skin), snow peas, spinach, squash, sweet potatoes, tomatoes, zucchini   For maximum fiber intake, eat the peels of fruits and vegetablesjust be sure to wash them well first.   Fruits   All fruits,  especially apples, berries, grapefruits, mangoes, nectarines, oranges, peaches, pears, dried fruits (figs, dates, prunes, raisins)   Choose raw fruits and vegetables over juice, cooked, or cannedraw fruit has more fiber. Dried fruit is also a good source of fiber.   Milk   With the exception of yogurt containing inulin (a type of fiber), dairy foods provide little fiber.   Add more fiber by topping your yogurt or cottage cheese with fresh fruit, whole grain or bran cereals, nuts, or seeds.   Meats and Beans   All beans and peas, especially Garbanzo beans, kidney beans, lentils, lima beans, split peas, and pinto beans All nuts and seeds, especially almonds, peanuts, Brazil nuts, cashews, peanut butter, walnuts, sesame and sunflower seeds All meat, poultry, fish, and eggs   Increase fiber in meat dishes by adding pinto beans, kidney beans, black-eyed peas, bran, or oatmeal. If you are following a low-fat diet, use nuts and seeds only in moderation.   Fats and Oils   All in moderation   Fats and oils do not provide fiber   Snacks, Sweets, and Condiments   Fruit Nuts Popcorn, whole-wheat pretzels, or trail mix made with dried fruits, nuts, and seeds Cakes, breads, and cookies made with oatmeal or whole-wheat flour   Most snack foods do not provide much fiber. Choose snacks with at least 2 grams of fiber per serving.     Last Reviewed: March 2011 Maria Adams, MS, MPH, RD   Updated: 06/01/2009
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
## 2020-07-28 NOTE — Discharge Instructions (Signed)
To see in the office in the next 2 to 3 weeks      EGD DISCHARGE INSTRUCTIONS    Activity:  Rest today. No driving, operating machinery, or making any important decisions today. May resume normal activity tomorrow.    Diet:  Following EGD eat slowly, chew food well, cut food into small pieces-Avoid greasy, spicy, "crunchy" foods X 48 hours.     Call your Doctor if you have any of the following:  -Passing blood rectally or vomiting blood (it may be red or black).  -Persistent nausea/vomiting  -Severe abdominal or chest pain not relieved with passing gas  -Fever of 100 degrees or more  -Redness or swelling at the IV site  -Severe sore throat or neck pain      Colonoscopy: What to Expect at [facility]  After you have a colonoscopy, you will stay at the clinic for 1 to 2 hours until the medicines wear off. Then you can go home, but you will need to arrange for a ride. Your doctor will tell you when you can eat and do your other usual activities.  Your doctor will talk to you about when you will need your next colonoscopy. The results of your test and your risk for colorectal cancer will help your doctor decide how often you need to be checked.  After the test, you may be bloated or have gas pains. You may need to pass gas. If a biopsy was done or a polyp was removed, you may have streaks of blood in your stool (feces) for a few days.  This care sheet gives you a general idea about how long it will take for you to recover. But each person recovers at a different pace. Follow the steps below to get better as quickly as possible.  How can you care for yourself at home?  Activity   Rest as much as you need to after you go home.   You should be able to go back to your usual activities the day after the test.  Diet   Follow your doctor's directions for eating.   Drink plenty of fluids (unless your doctor has told you not to) to replace the fluids that were lost during the colon prep.   Do not drink  alcohol.  Medicines   If polyps were removed or a biopsy was done during the test, your doctor may tell you not to take aspirin or other anti-inflammatory medicines, such as ibuprofen (Advil, Motrin) and naproxen (Aleve), for a few days.  Other instructions   For your safety, you should not drive or operate machinery until the medicine effects are gone and you can think clearly. Your doctor may tell you not to drive or operate machinery until the day after your test.   Do not sign legal documents or make major decisions until the medicine effects are gone and you can think clearly. The anesthesia medicine can make it hard for you to fully understand what you are agreeing to.  Follow-up care is a key part of your treatment and safety. Be sure to make and go to all appointments, and call your doctor if you are having problems. It's also a good idea to know your test results and keep a list of the medicines you take.    Call your Doctor if you have any of the following:             Passing blood rectally or vomiting blood (it  may be red or black).      Persistent nausea or vomiting.      Severe abdominal or chest pain, not relieved by passing gas.      Fever of 100 or more, chills or excessive sweating.      Redness or swelling at the IV site.     If you experience shortness of breath or severe chest pain, call 911.           Where can you learn more?   Go to [email or web address] and sign in to your MyChart account. Enter E264 in the [place] box to learn more about "Colonoscopy: What to Expect at Home."    If you do not have an account, please click on the "Sign Up Now" link.      2006-2013 [employer]. Care instructions adapted under license by [facility]. This care instruction is for use with your licensed healthcare professional. If you have questions about a medical condition or this instruction, always ask your healthcare professional. [facility] any warranty or liability for your use of this information.  Content Version: 9.9.209917; Last Revised: April 26, 2011        PATIENT INSTRUCTIONS  DIVERTICULOSIS    FOLLOW-UP:  Please make an appointment with your physician as directed.  Call your physician immediately if you have any fevers greater than 102.5,  increasing abdominal pain, GI bleeding (from the colon or rectum),or nausea/vomiting.      CAUSE:  Some people may have congenital diverticulosis, but most people develop diverticulosis around or after age 35 due to a low-fiber, high-fat diet, along with inadequate fluid intake. This is very prevalent in the industrialized world where we eat a lot of processed, low fiber foods.  Diverticuli develop due to firm stool and high pressures in the colon, along with secondary spasm, which causes outpouchings to occus where the small arteries penetrate the wall of the colon to feed the internal lining.  These occur most commonly on the left side and lower portions of the colon.  Diverticuli can then cause bleeding from the arteries at these sites of weakness when they rupture or the diveritculi can get blocked with stool and debris and become obstructed, causing diverticulosis, which is due to an infection.  When these are infected, diverticulitis, it is often treated with antibiotics and bowel rest, but when severe, recurrent, or if rupture of the colon occurs, it may require surgery.  Following appropriate dietary changes and taking the proper precautions is therefore very important.    DIET:  You should increase your dietary fiber intake and take a fiber supplement twice a day.  Make sure that you are taking a supplement that is just fiber and is not a laxative, which should be noted on the package.  Starting a fiber supplement may cause increased gas, more frequent bowel movements, and distension at first but this should improve after a couple of weeks.  Try to eat whole wheat breads  and pasta, more fruits and vegetables, along with brown rice and plenty of fluids.  Avoid small undigestible food items that could get stuck in these outpouchings, such as unpopped popcorn kernals, whole corn, small undigestible seeds, etc..    ACTIVITY:  Exercise is also a great way to prevent constipation and is encouraged.  It may also help prevent progression of your diverticulosis.  Always make sure you take in plenty of fluids when exercising.    MEDICATIONS:  Take an  over-the-counter fiber supplement as noted above twice daily.  If your symptoms don't improve with fiber and dietary changes alone your physician may also recommend psyllium or methylcellulose as well.  If your physician has placed you on an antibiotic it is critical that you take the full course of these, even if your symptoms have improved, and that you not miss any doses.    QUESTIONS:  Please feel free to call your physician or the hospital operator if you have any questions, and they will be glad to assist you.  If you have further questions it may also be helpful to meet with a dietitician.            Colon Polypectomy   (Colon Polyp Removal)       Definition   A colon polypectomy is the removal of polyps from the inside lining of the colon (large intestine). A polyp is a mass of tissue. Some types of polyps have the potential to develop into cancer. Most polyps can be removed during a colonoscopy or sigmoidoscopy .     A Colon Polyp        2011 [place].   Reasons for Procedure   The purpose of the surgery is to remove a polyp. It is done for cancer prevention.   In rare cases, larger polyps can cause troublesome symptoms, such as rectal bleeding, abdominal pain, and bowel irregularities. A polyp removal will relieve these symptoms.   Possible Complications   Complications are rare, but no procedure is completely free of risk. If you are planning to have a polypectomy, your doctor will review a list of possible  complications, which may include:    Damage to the colon wall    Bleeding    Infection    Adverse reaction to the sedative   Factors that may increase the risk of complications include:    Type, size, and location of the polyp    Patient factors, such as blood-clotting disorders, substance abuse, or other diseases (eg, obesity , diabetes )   What to Expect   Prior to Procedure    Your doctor will likely do the following:    Physical exam and health history    Review of medicines    Test your stool for hidden blood (called "occult blood")    X-rays an exam that uses small amounts of radiation to make a picture of the inside of the body    Barium enema x-ray exam that uses contrast to help better see the colon    Diagnostic colonoscopy or sigmoidoscopyexamination of the inside of the intestine with an endoscope   Your colon must be completely cleaned before the procedure. Any stool left in the intestine will block the view. This preparation may start several days before the procedure. Follow your doctor's instructions, which may include any of the following cleansing methods:    Enemas fluid introduced into the rectum to stimulate a bowel movement    Laxativesmedicines that cause you to have soft bowel movements    A clear-liquid diet    Oral cathartic medicinesa large container of fluid to drink, which stimulates a bowel movement   Leading up to your procedure:    Talk to your doctor about your medicines. You may be asked to stop taking some medicines up to one week before the procedure, like:    Anti-inflammatory drugs (eg, aspirin)    Blood thinners, like clopidogrel (Plavix) or warfarin (Coumadin)  Iron supplements or vitamins containing iron.    The night before, eat a light meal. Do not eat or drink anything after midnight.    Wear comfortable clothing.    If you have diabetes, ask your doctor if you need to adjust your insulin dose.    Arrange for a ride home after the procedure.    Anesthesia    You will receive a sedative. This will help you relax. You will be drowsy but awake.   Description of the Procedure    You will be asked to lie on your side or on your back. A scope, a long flexible tube with a camera on the end, will be inserted through the anus. It will be slowly pushed through the rectum to the colon. The scope will also add air to open the colon.   Using the scope, the doctor will locate the polyp. The polyp will be snipped off with a wire snare from the scope. In some cases, the polyp may be destroyed with an electric current. The electric current is also used to close the wound and stop bleeding. The polyps will then be removed for lab testing. When the doctor is finished, the scope will be slowly removed.   For larger polyps, a laparoscopic surgical procedure may be needed. Special surgical tools will be inserted through small incisions in the abdomen. The tools will be used to locate and remove the polyp.   How Long Will It Take?    30-60 minutes   Will It Hurt?    The special cleaning solution, laxatives, and/or enemas often cause discomfort. During and following the procedure, there is little or no pain. You may feel pressure, bloating, and/or cramping because of the air passed into the colon. This discomfort will go away with the passing of gas. Your doctor may prescribe pain medicine. If not, you can take non-prescription pain relievers for discomfort.   Post-procedure Care   At the [facility]    The polyps will be sent to a lab for testing.   At Home    Expect a complete recovery within two weeks. To ensure a smooth recovery, be sure to follow your doctor's instructions , which may include:    The sedative will make you drowsy. Do not drive, operate machinery, or make important decisions the day of the procedure.    Return to your normal diet the same or next day. Avoid tea, coffee, cola drinks, alcohol, and spicy foods for at least 2-3 days following surgery. These  can irritate the digestive system.    To speed healing, resume normal activities as soon as you feel able. Most people feel well enough by the next day.    Ask your doctor when you can participate in any rigorous exercise.    Ask your doctor about when it is safe to shower, bathe, or soak in water.    You will be scheduled for a follow-up colonoscopy in the future. It will be important to check for recurrence of polyps.   Your doctor will discuss the results with you either the day of surgery or the following day.   Call Your Doctor   After arriving home, contact your doctor if any of the following occurs:    Signs of infection, including fever and chills    Redness, swelling, increasing pain, excessive bleeding, or discharge from the rectum (Up to cup of blood per day can be expected for up to 3-4 days following your  polypectomy.)    Black, tarry stools    Severe abdominal pain    Hard, swollen abdomen    Inability to pass gas or stool    Cough , shortness of breath, chest pain, or severe nausea or vomiting    New, unexplained symptoms   In case of emergency,  CALL 911  .     Last Reviewed: December 2010 Tonia Ghent, MD   Updated: 06/10/2009       Sedation or General Anesthesia, Adult  Care After  Refer to this sheet in the next 24 hours. These instructions provide you with information on caring for yourself after your procedure. Your caregiver may also give you more specific instructions. Your treatment has been planned according to current medical practices, but problems sometimes occur. Call your caregiver if you have any problems or questions after your procedure.   HOME CARE INSTRUCTIONS    Do not participate in any activities that require you to be alert or coordinated. Do not:   Drive.   Swim.   Ride a bicycle.   Operate heavy machinery.   Cook.   Use power tools.   Climb ladders.   Work at [employer].   Take a bath.   Do not drink alcohol.   Do not make any important decisions or sign  legal documents.   Stay with an adult.   The first meal following your procedure should be light and small. Avoid solid foods if you feel sick to your stomach (nauseous) or if you throw up (vomit).   Drink enough fluids to keep your urine clear or pale yellow.   Only take your usual medicines or new medicines if your caregiver approves them.   Only take over-the-counter or prescription medicines for pain, discomfort, or fever as directed by your caregiver.   Keep all follow-up appointments as directed by your caregiver.  SEEK IMMEDIATE MEDICAL CARE IF:    You are not feeling normal or behaving normally after 24 hours.   You have persistent nausea and vomiting.   You are unable to drink fluids or eat food.   You have difficulty urinating.   You have difficulty breathing or speaking.   You have blue or gray skin.   There is difficulty waking or you cannot be woken up.   You have heavy bleeding, redness, or a lot of swelling where the sedative or anesthesia entered your skin (intravenous site).   You have a rash.  MAKE SURE YOU:   Understand these instructions.   Will watch your condition.   Will get help right away if you are not doing well or get worse.  Document Released: 02/20/2005 Document Revised: 08/22/2011 Document Reviewed: 06/21/2011  [facility] Patient Information 2013 [place].

## 2020-07-29 LAB — SURGICAL PATHOLOGY REPORT

## 2020-07-30 ENCOUNTER — Encounter

## 2020-07-30 MED ORDER — LISINOPRIL 10 MG PO TABS
10 MG | ORAL_TABLET | ORAL | 1 refills | Status: DC
Start: 2020-07-30 — End: 2021-01-03

## 2020-08-27 ENCOUNTER — Ambulatory Visit: Admit: 2020-08-27 | Discharge: 2020-08-27 | Payer: MEDICARE | Attending: Nurse Practitioner | Primary: Adult Health

## 2020-08-27 DIAGNOSIS — K5730 Diverticulosis of large intestine without perforation or abscess without bleeding: Secondary | ICD-10-CM
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
## 2020-09-07 NOTE — Telephone Encounter (Signed)
Patient called into the office stating she has only been getting 50 strips from pharmacy and she has been running out. Patient did state some days she does check more than once if she is not feeling right. Patient is unsure if there is a way for her to get more strips.     rx in chart states 100 strips but patient denied getting that amount that she only gets 50.

## 2020-09-07 NOTE — Telephone Encounter (Signed)
If I'm writing for 100 and she is only getting 58, it is likely because her insurance will only cover that amount. They typically only cover once a day testing if patient's are not on insulin

## 2020-09-07 NOTE — Telephone Encounter (Signed)
Patient notified

## 2020-09-09 ENCOUNTER — Inpatient Hospital Stay: Admit: 2020-09-09 | Payer: MEDICARE | Primary: Adult Health

## 2020-09-09 ENCOUNTER — Encounter

## 2020-09-09 DIAGNOSIS — Z1231 Encounter for screening mammogram for malignant neoplasm of breast: Secondary | ICD-10-CM

## 2020-09-28 ENCOUNTER — Encounter

## 2020-09-28 MED ORDER — ATORVASTATIN CALCIUM 20 MG PO TABS
20 MG | ORAL_TABLET | ORAL | 3 refills | Status: AC
Start: 2020-09-28 — End: 2021-10-19

## 2020-09-28 NOTE — Telephone Encounter (Signed)
Next Visit Date:  Future Appointments   Date Time Provider [place]   09/29/2020 11:45 AM [name], [place]       Last Visit Date:  [id number]    Health Maintenance   Topic Date Due    DTaP/Tdap/Td vaccine (1 - Tdap) Never done    Shingles vaccine (1 of 2) Never done    Diabetic retinal exam  08/12/2019    Annual Wellness Visit (AWV)  09/28/2019    Diabetic microalbuminuria test  09/28/2020    COVID-19 Vaccine (2 - Booster for Janssen series) 09/29/2020 (Originally 07/06/2019)    Flu vaccine (1) 11/04/2020    Diabetic foot exam  01/25/2021    A1C test (Diabetic or Prediabetic)  04/01/2021    Lipids  04/01/2021    Depression Screen  04/01/2021    Breast cancer screen  09/10/2022    Colorectal Cancer Screen  07/28/2025    DEXA (modify frequency per FRAX score)  Completed    Pneumococcal 65+ years Vaccine  Completed    Hepatitis C screen  Completed    Hepatitis A vaccine  Aged Out    Hib vaccine  Aged Out    Meningococcal (ACWY) vaccine  Aged Out       Hemoglobin A1C (%)   Date Value   04/01/2020 6.3   09/29/2019 6.4   04/03/2019 6.4             ( goal A1C is < 7)   Microalb/Crt. Ratio (mcg/mg creat)   Date Value   09/29/2019 CANNOT BE CALCULATED     LDL Cholesterol (mg/dL)   Date Value   04/01/2020 105     LDL Calculated (mg/dL)   Date Value   03/24/2016 143 (H)       (goal LDL is <100)   AST (U/L)   Date Value   06/02/2020 26     ALT (U/L)   Date Value   06/02/2020 19     BUN (mg/dL)   Date Value   06/02/2020 24 (H)     BP Readings from Last 3 Encounters:   08/27/20 138/72   07/28/20 116/71   07/06/20 138/80          (goal 120/80)    All Future Testing planned in [facility]  Lab Frequency Next Occurrence   COLONOSCOPY W/ OR W/O BIOPSY Once 10/06/2020   EGD Once 10/06/2020   Pancreatic elastase, fecal Once 10/06/2020   Giardia antigen Once 10/06/2020               Patient Active Problem List:     Hyperlipidemia     HTN (hypertension)     OA (osteoarthritis)     Anxiety     Tubular  adenoma     Dermatophytosis of nail     Benign neoplasm of skin     Diverticulosis of colon     Chronic GERD     History of gastric polyp     Controlled type 2 diabetes mellitus without complication, without long-term current use of insulin ([place])

## 2020-09-29 ENCOUNTER — Encounter: Admit: 2020-09-29 | Discharge: 2020-09-29 | Payer: MEDICARE | Attending: Adult Health | Primary: Adult Health

## 2020-09-29 ENCOUNTER — Inpatient Hospital Stay: Payer: MEDICARE | Primary: Adult Health

## 2020-09-29 DIAGNOSIS — Z0000 Encounter for general adult medical examination without abnormal findings: Secondary | ICD-10-CM

## 2020-09-29 DIAGNOSIS — E7800 Pure hypercholesterolemia, unspecified: Secondary | ICD-10-CM

## 2020-09-29 LAB — POCT GLYCOSYLATED HEMOGLOBIN (HGB A1C): Hemoglobin A1C: 6.5 %

## 2020-09-29 NOTE — Patient Instructions (Signed)
Personalized Preventive Plan for Marissa Sawyer - 09/29/2020  Medicare offers a range of preventive health benefits. Some of the tests and screenings are paid in full while other may be subject to a deductible, co-insurance, and/or copay.    Some of these benefits include a comprehensive review of your medical history including lifestyle, illnesses that may run in your family, and various assessments and screenings as appropriate.    After reviewing your medical record and screening and assessments performed today your provider may have ordered immunizations, labs, imaging, and/or referrals for you.  A list of these orders (if applicable) as well as your Preventive Care list are included within your After Visit Summary for your review.    Other Preventive Recommendations:    A preventive eye exam performed by an eye specialist is recommended every 1-2 years to screen for glaucoma; cataracts, macular degeneration, and other eye disorders.  A preventive dental visit is recommended every 6 months.  Try to get at least 150 minutes of exercise per week or 10,000 steps per day on a pedometer .  Order or download the FREE "Exercise & Physical Activity: Your Everyday Guide" from [employer]. Call 580-789-0741 or search [employer] online.  You need 1200-1500 mg of calcium and 1000-2000 IU of vitamin D per day. It is possible to meet your calcium requirement with diet alone, but a vitamin D supplement is usually necessary to meet this goal.  When exposed to the sun, use a sunscreen that protects against both UVA and UVB radiation with an SPF of 30 or greater. Reapply every 2 to 3 hours or after sweating, drying off with a towel, or swimming.  Always wear a seat belt when traveling in a car. Always wear a helmet when riding a bicycle or motorcycle.

## 2020-09-29 NOTE — Progress Notes (Signed)
Medicare Annual Wellness Visit    Marissa Sawyer is here for Medicare AWV    Assessment & Plan   Medicare annual wellness visit, subsequent  Controlled type 2 diabetes mellitus without complication, without long-term current use of insulin ([place])  -     POCT glycosylated hemoglobin (Hb A1C)  -     Microalbumin, Ur; Future  Counseling regarding advance directives and goals of care  -     [place] Referral to ACP Clinical Specialist  Pure hypercholesterolemia  -     Lipid Panel; Future  -     CBC with Auto Differential; Future  -     Comprehensive Metabolic Panel; Future  Essential hypertension  Vitamin D insufficiency  -     Vitamin D 25 Hydroxy; Future    Recommendations for Preventive Services Due: see orders and patient instructions/AVS.  Recommended screening schedule for the next 5-10 years is provided to the patient in written form: see Patient Instructions/AVS.     Return in 6 months (on 04/01/2021), or DM, for Medicare Annual Wellness Visit in 1 year.    Blood work is fasting overnight, 10-12 hours. Advised the patient they can have water in the morning but no food or sugary drinks.   Will contact with results and further instructions  Continue current treatment plan  RTC in 6 months, sooner if needed     Subjective   The following acute and/or chronic problems were also addressed today:  A1c still stable at 6.5.     Patient's complete Health Risk Assessment and screening values have been reviewed and are found in Flowsheets. The following problems were reviewed today and where indicated follow up appointments were made and/or referrals ordered.    Positive Risk Factor Screenings with Interventions:             General Health and ACP:  General  In general, how would you say your health is?: Good  In the past 7 days, have you experienced any of the following: New or Increased Pain, New or Increased Fatigue, Loneliness, Social Isolation, Stress or Anger?: (!) Yes  Select all that apply: (!) Stress  Do you get the  social and emotional support that you need?: Yes  Do you have a Living Will?: (!) No    Advance Directives       Power of Attorney Living Will ACP-Advance Directive ACP-Power of Attorney    Not on File Not on File Not on File Not on File        General Health Risk Interventions:  Stress: patient's comments regarding reasons for stress and/or anger: she states it is not increased, she states it is her normal stress. She doesn't need anything for it.   No Living Will: Patient referred to ACP Clinical Specialist              Objective   Vitals:    09/29/20 1205   BP: 132/84   Pulse: 50   SpO2: 98%   Weight: 155 lb (70.3 kg)   Height: 5' 4" (1.626 m)      Body mass index is 26.61 kg/m??.      General Appearance: alert and oriented to person, place and time, well-developed and well-nourished, in no acute distress  Skin: warm and dry, no rash or erythema  Head: normocephalic and atraumatic  Pulmonary/Chest: clear to auscultation bilaterally- no wheezes, rales or rhonchi, normal air movement, no respiratory distress  Cardiovascular: normal rate, regular rhythm, normal S1  and S2, no murmurs, and no gallops  Extremities: no edema  Neurologic: no cranial nerve deficit, gait and coordination normal, and speech normal       No Known Allergies  Prior to Visit Medications    Medication Sig Taking? Authorizing Provider   atorvastatin (LIPITOR) 20 MG tablet TAKE 1 TABLET BY MOUTH EVERY OTHER DAY Yes [name], APRN - CNP   lisinopril (PRINIVIL;ZESTRIL) 10 MG tablet TAKE 1 TABLET BY MOUTH DAILY Yes [name], APRN - CNP   polyethylene glycol (GLYCOLAX) 17 GM/SCOOP powder Follow instructions provided to you from physician's office. Yes Feliberto Gottron, MD   bisacodyl (BISACODYL) 5 MG EC tablet Follow instructions provided given by the physician's office. Yes Feliberto Gottron, MD   colestipol (COLESTID) 1 g tablet Take 1 tablet by mouth 2 times daily Yes [name], APRN - CNP   metFORMIN  (GLUCOPHAGE-XR) 500 MG extended release tablet TAKE 1 TABLET BY MOUTH DAILY WITH BREAKFAST Yes [name], APRN - CNP   omeprazole (PRILOSEC) 40 MG delayed release capsule TAKE 1 CAPSULE BY MOUTH DAILY Yes [name], APRN - CNP   blood glucose test strips (ONETOUCH ULTRA) strip use 1 TEST STRIP to TEST BLOOD SUGAR once daily Yes [name], APRN - CNP   busPIRone (BUSPAR) 10 MG tablet Take 1 tablet by mouth 3 times daily as needed (anxiety) Yes [name], APRN - CNP   Blood Pressure Monitor DEVI Use daily and prn to check blood pressure d/t htn Yes [name], APRN - CNP   Lancets MISC Test blood sugar once daily. Yes Kris Mouton, MD   Blood Glucose Monitoring Suppl [facility] BLOOD GLUCOSE METER) w/Device KIT Test daily and as needed for DM Yes [name], APRN - CNP       CareTeam (Including outside providers/suppliers regularly involved in providing care):   Patient Care Team:  [name], APRN - CNP as PCP - General (Nurse Practitioner Adult Health)  [name], APRN - CNP as PCP - [facility] Empaneled Provider  Feliberto Gottron, MD as Consulting Physician (Gastroenterology)     Reviewed and updated this visit:  Tobacco   Meds   Med Hx   Surg Hx   Soc Hx   Fam Hx

## 2020-09-30 LAB — COMPREHENSIVE METABOLIC PANEL
ALT: 16 U/L (ref 5–33)
AST: 25 U/L (ref <32)
Albumin/Globulin Ratio: 1.4 (ref 1.0–2.5)
Albumin: 4.5 g/dL (ref 3.5–5.2)
Alkaline Phosphatase: 74 U/L (ref 35–104)
Anion Gap: 14 mmol/L (ref 9–17)
BUN: 20 mg/dL (ref 8–23)
CO2: 24 mmol/L (ref 20–31)
Calcium: 9.5 mg/dL (ref 8.6–10.4)
Chloride: 105 mmol/L (ref 98–107)
Creatinine: 0.78 mg/dL (ref 0.50–0.90)
GFR African American: >60 mL/min (ref >60)
GFR Non-African American: >60 mL/min (ref >60)
Glucose: 105 mg/dL — ABNORMAL HIGH (ref 70–99)
Potassium: 4.3 mmol/L (ref 3.7–5.3)
Sodium: 143 mmol/L (ref 135–144)
Total Bilirubin: 0.3 mg/dL (ref 0.3–1.2)
Total Protein: 7.8 g/dL (ref 6.4–8.3)

## 2020-09-30 LAB — LIPID PANEL
Chol/HDL Ratio: 4.7 (ref <5)
Cholesterol: 196 mg/dL (ref <200)
HDL: 42 mg/dL (ref >40)
LDL Cholesterol: 117 mg/dL (ref 0–130)
Triglycerides: 187 mg/dL — ABNORMAL HIGH (ref <150)

## 2020-09-30 LAB — CBC WITH AUTO DIFFERENTIAL
Absolute Eos #: 0.32 10*3/uL (ref 0.00–0.44)
Absolute Immature Granulocyte: <0.03 10*3/uL (ref 0.00–0.30)
Absolute Lymph #: 1.93 10*3/uL (ref 1.10–3.70)
Absolute Mono #: 0.6 10*3/uL (ref 0.10–1.20)
Basophils Absolute: 0.04 10*3/uL (ref 0.00–0.20)
Basophils: 1 % (ref 0–2)
Eosinophils %: 5 % — ABNORMAL HIGH (ref 1–4)
Hematocrit: 42.2 % (ref 36.3–47.1)
Hemoglobin: 13.2 g/dL (ref 11.9–15.1)
Immature Granulocytes: 0 %
Lymphocytes: 31 % (ref 24–43)
MCH: 28.9 pg (ref 25.2–33.5)
MCHC: 31.3 g/dL (ref 28.4–34.8)
MCV: 92.5 fL (ref 82.6–102.9)
MPV: 10.7 fL (ref 8.1–13.5)
Monocytes: 10 % (ref 3–12)
NRBC Automated: 0 per 100 WBC
Platelets: 258 10*3/uL (ref 138–453)
RBC: 4.56 m/uL (ref 3.95–5.11)
RDW: 13.2 % (ref 11.8–14.4)
Seg Neutrophils: 53 % (ref 36–65)
Segs Absolute: 3.42 10*3/uL (ref 1.50–8.10)
WBC: 6.3 10*3/uL (ref 3.5–11.3)

## 2020-09-30 LAB — MICROALBUMIN, UR
Creatinine, Ur: 88.6 mg/dL (ref 28.0–217.0)
Microalb, Ur: <12 mg/L (ref <21)

## 2020-09-30 LAB — VITAMIN D 25 HYDROXY: Vit D, 25-Hydroxy: 36.9 ng/mL (ref >29.9)

## 2020-09-30 NOTE — Telephone Encounter (Signed)
Faxed request to [place] in [place]. Fax number [phone]

## 2020-09-30 NOTE — Telephone Encounter (Signed)
-----   Message from [name], APRN - CNP sent at 09/29/2020 12:22 PM EDT -----  Regarding: eye exam  [facility] for [facility] exam

## 2020-09-30 NOTE — ACP (Advance Care Planning) (Signed)
Advance Care Planning   Ambulatory ACP Specialist Patient Outreach    Date:  09/30/2020  ACP Specialist:  Dionicio Stall    Outreach call to patient in follow-up to ACP Specialist referral from: [name], APRN - CNP    '[x]'$  PCP  '[]'$  Provider   '[]'$  Ambulatory Care Management '[]'$  Other for Reason:    '[x]'$  Advance Directive Assistance  '[]'$  Code Status Discussion  '[]'$  Complete Portable DNR Order  '[]'$  Discuss Goals of Care  '[]'$  Complete POST/MOST  '[]'$  Early ACP Decision-Making  '[]'$  Other    Date Referral Received:09/29/20    Today's Outreach:  '[x]'$  First   '[]'$  Second  '[]'$  Third                               First outreach made by '[x]'$   phone  '[]'$  email '[]'$    MyChart     Intervention:  '[]'$  Spoke with Patient  '[x]'$  Left VM requesting return call      Outcome: Left detailed VM for Patient to return call.      Next Step:   '[]'$  ACP scheduled conversation  '[x]'$  Outreach again in two week               '[]'$  Email / [place]  '[]'$  Email / Mail Advance Directive            '[]'$  Close Referral. Routing closure to referring provider/staff and to ACP Personal assistant.             '[]'$  Closure Letter mailed to Patient with Invitation to Contact ACP Specialist if/when ready.    Thank you for this referral.

## 2020-10-14 NOTE — ACP (Advance Care Planning) (Signed)
Advance Care Planning   Ambulatory ACP Specialist Patient Outreach    Date:  10/14/2020  ACP Specialist:  Dionicio Stall    Outreach call to patient in follow-up to ACP Specialist referral from: [name], APRN - CNP    '[x]'$  PCP  '[]'$  Provider   '[]'$  Ambulatory Care Management '[]'$  Other for Reason:    '[x]'$  Advance Directive Assistance  '[]'$  Code Status Discussion  '[]'$  Complete Portable DNR Order  '[]'$  Discuss Goals of Care  '[]'$  Complete POST/MOST  '[]'$  Early ACP Decision-Making  '[]'$  Other    Date Referral Received:09/29/20    Today's Outreach:  '[]'$  First   '[x]'$  Second  '[]'$  Third                               Second outreach made by '[x]'$   phone  '[x]'$  email '[]'$    MyChart     Intervention:  '[]'$  Spoke with Patient  '[x]'$  Left VM requesting return call      Outcome:  Left detailed VM for Patient to return call. Sent Email w/ACP Packet included.       Next Step:   '[]'$  ACP scheduled conversation  '[x]'$  Outreach again in two week               '[x]'$  Email / Mail ACP Info Sheets  '[x]'$  Email / Mail Advance Directive            '[]'$  Close Referral. Routing closure to referring provider/staff and to ACP [employer].             '[]'$  Closure Letter mailed to Patient with Invitation to Contact ACP Specialist if/when ready.    Thank you for this referral.

## 2020-11-01 NOTE — ACP (Advance Care Planning) (Signed)
Advance Care Planning   Ambulatory ACP Specialist Patient Outreach    Date:  11/01/2020  ACP Specialist:  Dionicio Stall    Outreach call to patient in follow-up to ACP Specialist referral from: [name], APRN - CNP    '[x]'$  PCP  '[]'$  Provider   '[]'$  Ambulatory Care Management '[]'$  Other for Reason:    '[x]'$  Advance Directive Assistance  '[]'$  Code Status Discussion  '[]'$  Complete Portable DNR Order  '[]'$  Discuss Goals of Care  '[]'$  Complete POST/MOST  '[]'$  Early ACP Decision-Making  '[]'$  Other    Date Referral Received: 09/29/20    Today's Outreach:  '[]'$  First   '[]'$  Second  '[x]'$  Third                               Third outreach made by '[x]'$   phone  '[x]'$  email '[x]'$    MyChart     Intervention:  '[]'$  Spoke with Patient  '[x]'$  Left VM requesting return call      Outcome: Third Outreach. Left detailed VM for Patient to return call. Sent Closure Letter via Email w/ACP Packet included. [employer].          Next Step:   '[]'$  ACP scheduled conversation  '[]'$  Outreach again in one week               '[x]'$  Email / [place]  '[x]'$  Email / Mail Advance Directive            '[x]'$  Close Referral. Routing closure to referring provider/staff and to ACP [address]             '[]'$  Closure Letter mailed to Patient with Invitation to Contact ACP Specialist if/when ready.    Thank you for this referral.

## 2021-01-03 ENCOUNTER — Encounter

## 2021-01-03 MED ORDER — LISINOPRIL 10 MG PO TABS
10 MG | ORAL_TABLET | ORAL | 1 refills | Status: DC
Start: 2021-01-03 — End: 2021-04-04

## 2021-01-03 MED ORDER — METFORMIN HCL ER 500 MG PO TB24
500 MG | ORAL_TABLET | ORAL | 1 refills | Status: DC
Start: 2021-01-03 — End: 2021-07-25

## 2021-01-03 NOTE — Telephone Encounter (Signed)
Last seen 09/29/2020    Next Visit Date:  Future Appointments   Date Time Provider [place]   04/04/2021 10:15 AM [name], [place] [facility]   Topic Date Due    DTaP/Tdap/Td vaccine (1 - Tdap) Never done    Shingles vaccine (1 of 2) Never done    COVID-19 Vaccine (2 - Booster for Janssen series) 07/06/2019    Flu vaccine (1) Never done    Diabetic foot exam  01/25/2021    Diabetic retinal exam  06/14/2021    A1C test (Diabetic or Prediabetic)  09/29/2021    Diabetic microalbuminuria test  09/29/2021    Lipids  09/29/2021    Depression Screen  09/29/2021    Annual Wellness Visit (AWV)  09/30/2021    Breast cancer screen  09/10/2022    Colorectal Cancer Screen  07/28/2025    DEXA (modify frequency per FRAX score)  Completed    Pneumococcal 65+ years Vaccine  Completed    Hepatitis C screen  Completed    Hepatitis A vaccine  Aged Out    Hib vaccine  Aged Out    Meningococcal (ACWY) vaccine  Aged Out       Hemoglobin A1C (%)   Date Value   09/29/2020 6.5   04/01/2020 6.3   09/29/2019 6.4             ( goal A1C is < 7)   Microalb/Crt. Ratio (mcg/mg creat)   Date Value   09/29/2020 Can not be calculated     LDL Cholesterol (mg/dL)   Date Value   09/29/2020 117     LDL Calculated (mg/dL)   Date Value   03/24/2016 143 (H)       (goal LDL is <100)   AST (U/L)   Date Value   09/29/2020 25     ALT (U/L)   Date Value   09/29/2020 16     BUN (mg/dL)   Date Value   09/29/2020 20     BP Readings from Last 3 Encounters:   09/29/20 132/84   08/27/20 138/72   07/28/20 116/71          (goal 120/80)    All Future Testing planned in [facility]  Lab Frequency Next Occurrence   Giardia antigen Once 10/06/2020               Patient Active Problem List:     Hyperlipidemia     HTN (hypertension)     OA (osteoarthritis)     Anxiety     Tubular adenoma     Dermatophytosis of nail     Benign neoplasm of skin     Diverticulosis of colon     Chronic GERD     History of gastric polyp      Controlled type 2 diabetes mellitus without complication, without long-term current use of insulin ([place])

## 2021-03-28 ENCOUNTER — Ambulatory Visit: Admit: 2021-03-28 | Discharge: 2021-03-28 | Payer: MEDICARE | Attending: Foot & Ankle Surgery | Primary: Adult Health

## 2021-03-28 DIAGNOSIS — B351 Tinea unguium: Secondary | ICD-10-CM

## 2021-03-28 NOTE — Progress Notes (Signed)
SUBJECTIVE: Marissa Sawyer is a 71 y.o. female who returns to the office with chief complaint of painful fungal toenails. Patient relates toe nails are thickened/difficult to trim as well as painful with ambulation and with shoe gear.   Chief Complaint   Patient presents with    Nail Problem     B/l nail trim/ last seen Eugene Gavia 09/29/2020    Diabetes     Last blood sugar 200     Review of Systems   Constitutional:  Negative for activity change, appetite change, chills, diaphoresis, fatigue and fever.   Respiratory:  Negative for shortness of breath.    Cardiovascular:  Negative for leg swelling.   Gastrointestinal:  Negative for diarrhea and nausea.   Endocrine: Negative for cold intolerance, heat intolerance and polyuria.   Musculoskeletal:  Positive for arthralgias. Negative for back pain, gait problem, joint swelling and myalgias.   Skin:  Negative for color change, pallor, rash and wound.   Allergic/Immunologic: Negative for environmental allergies and food allergies.   Neurological:  Negative for dizziness, weakness, light-headedness and numbness.   Hematological:  Does not bruise/bleed easily.   Psychiatric/Behavioral:  Negative for behavioral problems, confusion and self-injury. The patient is not nervous/anxious.    OBJECTIVE: Clinical evaluation of patient reveals nails 1,2,3,4,5 of the right foot and nails 1,2,3,4,5 of the left foot to present with thickness, elongation, discoloration, brittleness, and subungual debris. There was pain with palpation and debridement of the toenails of the bilateral feet. No open lesions noted to either foot today.   The right DP pulse is not palpable.   The left DP pulse is not palpable.   The right PT pulse is not palpable.   The left PT pulse is not palpable.   Protective sensation is present to the right plantar foot as noted with a 5.07 Semmes-Weinstein monofilament.   Protective sensation is present to the left plantar foot as noted with a 5.07 Semmes-Weinstein  monofilament.   Glucose: 200 mg/dl.    Class A Findings (1 needed)   []  Non-traumatic amputation of foot or integral skeleton portion thereof.   []  Q7.      Class B Findings (2 needed)   1. [x]  Absent posterior tibial pulse   2. [x]  Absent dorsalis pedis pulse   3. []  Advanced trophic changes; three of the following are required:   ??         []  hair growth (decrease or absence)   ??         []  nail changes (thickening)   ??         []  pigmentary changes (discoloration)   ??         []  skin texture (thin, shiny)   ??         []  skin color (rubor or redness)   [x]  Q8.      Class C Findings (1 Class B, 2 Class C needed)   1. []  Claudication   2. []  Temperature changes   3. []  Edema   4. []  Paresthesia   5. []  Burning   []  Q9.     ASSESSMENT:    Diagnosis Orders   1. Onychomycosis of toenail  PR DEBRIDEMENT NAIL ANY METHOD 6/>    HM DIABETES FOOT EXAM      2. Pain of toes of both feet  PR DEBRIDEMENT NAIL ANY METHOD 6/>    HM DIABETES FOOT EXAM      3. Type 2 diabetes  mellitus with peripheral vascular disease ([place])  PR DEBRIDEMENT NAIL ANY METHOD 6/>    HM DIABETES FOOT EXAM        PLAN: Toenails 1,2,3,4,5 of the right foot and 1,2,3,4,5 of the left foot were debrided in length and thickness using a nail nipper and a grinder. Return in about 9 weeks (around 05/30/2021) for At risk diabetic foot care.   03/28/2021      [name], DPM

## 2021-04-04 ENCOUNTER — Inpatient Hospital Stay: Payer: MEDICARE | Primary: Adult Health

## 2021-04-04 ENCOUNTER — Ambulatory Visit: Admit: 2021-04-04 | Discharge: 2021-04-04 | Payer: MEDICARE | Attending: Adult Health | Primary: Adult Health

## 2021-04-04 DIAGNOSIS — E119 Type 2 diabetes mellitus without complications: Secondary | ICD-10-CM

## 2021-04-04 DIAGNOSIS — E7800 Pure hypercholesterolemia, unspecified: Secondary | ICD-10-CM

## 2021-04-04 LAB — POCT GLYCOSYLATED HEMOGLOBIN (HGB A1C): Hemoglobin A1C: 6.6 %

## 2021-04-04 MED ORDER — LISINOPRIL 20 MG PO TABS
20 MG | ORAL_TABLET | ORAL | 1 refills | Status: DC
Start: 2021-04-04 — End: 2021-09-16

## 2021-04-04 NOTE — Progress Notes (Signed)
Patient in office for routine follow up. Abd pain, back pain.     Visit Information    Have you changed or started any medications since your last visit including any over-the-counter medicines, vitamins, or herbal medicines? no   Have you stopped taking any of your medications? Is so, why? -  no  Are you having any side effects from any of your medications? - no    Have you seen any other physician or provider since your last visit?  no   Have you had any other diagnostic tests since your last visit?  no   Have you been seen in the emergency room and/or had an admission in a hospital since we last saw you?  no   Have you had your routine dental cleaning in the past 6 months?  no     Do you have an active MyChart account? If no, what is the barrier?  Yes    Patient Care Team:  Eugene Gavia, APRN - CNP as PCP - General (Nurse Practitioner Adult Health)  Eugene Gavia, APRN - CNP as PCP - [facility] Empaneled Provider  Feliberto Gottron, MD as Consulting Physician (Gastroenterology)    Medical History Review  Past Medical, Family, and Social History reviewed and does contribute to the patient presenting condition    Health Maintenance   Topic Date Due    DTaP/Tdap/Td vaccine (1 - Tdap) Never done    Shingles vaccine (1 of 2) Never done    COVID-19 Vaccine (2 - Booster for Janssen series) 07/06/2019    Flu vaccine (1) Never done    Diabetic retinal exam  06/14/2021    A1C test (Diabetic or Prediabetic)  09/29/2021    Diabetic Alb to Cr ratio (uACR) test  09/29/2021    Lipids  09/29/2021    Depression Screen  09/29/2021    GFR test (Diabetes, CKD 3-4, OR last GFR 15-59)  09/29/2021    Annual Wellness Visit (AWV)  09/30/2021    Diabetic foot exam  03/28/2022    Breast cancer screen  09/10/2022    Colorectal Cancer Screen  07/28/2025    DEXA (modify frequency per FRAX score)  Completed    Pneumococcal 65+ years Vaccine  Completed    Hepatitis C screen  Completed    Hepatitis A vaccine  Aged Out    Hib vaccine  Aged Out     Meningococcal (ACWY) vaccine  Aged Out

## 2021-04-04 NOTE — Progress Notes (Signed)
Subjective:      Chief Complaint   Patient presents with    Diabetes       Patient ID: Marissa Sawyer is a 71 y.o. female.    Diabetes  She presents for her follow-up diabetic visit. She has type 2 diabetes mellitus. Her disease course has been stable. Hypoglycemia symptoms include nervousness/anxiousness. Pertinent negatives for hypoglycemia include no dizziness, headaches, pallor or tremors. Pertinent negatives for diabetes include no blurred vision, no chest pain, no fatigue, no foot paresthesias, no polydipsia, no polyuria and no weakness. There are no hypoglycemic complications. Symptoms are stable. There are no diabetic complications. Pertinent negatives for diabetic complications include no CVA, nephropathy or peripheral neuropathy. Risk factors for coronary artery disease include diabetes mellitus, dyslipidemia, hypertension, sedentary lifestyle, stress and post-menopausal. Current diabetic treatment includes diet and oral agent (monotherapy). She is compliant with treatment all of the time. Her weight is stable. She is following a generally healthy diet. Meal planning includes avoidance of concentrated sweets. She has had a previous visit with a dietitian. She participates in exercise intermittently. (140-150) An ACE inhibitor/angiotensin II receptor blocker is being taken. She sees a podiatrist.Eye exam is current.   Hypertension  This is a chronic problem. The current episode started more than 1 year ago. The problem has been gradually worsening since onset. The problem is uncontrolled. Pertinent negatives include no anxiety, blurred vision, chest pain, headaches, neck pain, palpitations, peripheral edema or shortness of breath. There are no associated agents to hypertension. Risk factors for coronary artery disease include diabetes mellitus, post-menopausal state, sedentary lifestyle and stress. Past treatments include ACE inhibitors. The current treatment provides mild improvement. Compliance problems  include diet and exercise.  There is no history of kidney disease, CAD/MI, CVA or heart failure. There is no history of chronic renal disease.   Hyperlipidemia  This is a chronic problem. The current episode started more than 1 year ago. The problem is controlled. Recent lipid tests were reviewed and are normal. Exacerbating diseases include diabetes. She has no history of chronic renal disease, hypothyroidism, liver disease, obesity or nephrotic syndrome. Factors aggravating her hyperlipidemia include fatty foods. Associated symptoms include myalgias. Pertinent negatives include no chest pain, focal sensory loss, focal weakness, leg pain or shortness of breath. Current antihyperlipidemic treatment includes statins (taking it every other day). The current treatment provides significant improvement of lipids. Compliance problems include adherence to diet and adherence to exercise.  Risk factors for coronary artery disease include hypertension, dyslipidemia, a sedentary lifestyle, post-menopausal and diabetes mellitus.   Abdominal Pain  This is a new problem. The current episode started 1 to 4 weeks ago. The onset quality is sudden. The problem occurs intermittently. The problem has been unchanged. The pain is located in the RUQ. Quality: twisting pain in the RUQ, burning pain in the back. The abdominal pain radiates to the back. Associated symptoms include myalgias. Pertinent negatives include no arthralgias, constipation, diarrhea, fever, headaches, nausea or vomiting. Nothing aggravates the pain. The pain is relieved by Nothing. She has tried nothing for the symptoms. Her past medical history is significant for abdominal surgery (+ chole).     Past medical, surgical, social, and family history reviewed along with medications.       Review of Systems   Constitutional:  Negative for chills, diaphoresis, fatigue and fever.   Eyes:  Negative for blurred vision.   Respiratory:  Negative for shortness of breath.     Cardiovascular:  Negative for chest pain and  palpitations.   Gastrointestinal:  Positive for abdominal pain. Negative for constipation, diarrhea, nausea and vomiting.   Endocrine: Negative for polydipsia and polyuria.   Musculoskeletal:  Positive for back pain and myalgias. Negative for arthralgias, gait problem, joint swelling and neck pain.   Skin:  Negative for pallor.   Neurological:  Negative for dizziness, tremors, focal weakness, weakness, light-headedness, numbness and headaches.   Psychiatric/Behavioral:  Negative for self-injury and suicidal ideas. The patient is nervous/anxious.    All other systems reviewed and are negative.    Objective:   Physical Exam  Vitals and nursing note reviewed.   Constitutional:       General: She is not in acute distress.     Appearance: Normal appearance. She is normal weight. She is not ill-appearing, toxic-appearing or diaphoretic.   HENT:      Head: Normocephalic and atraumatic.   Cardiovascular:      Rate and Rhythm: Normal rate and regular rhythm.      Pulses: Normal pulses.      Heart sounds: Normal heart sounds. No murmur heard.  Pulmonary:      Effort: Pulmonary effort is normal.      Breath sounds: Normal breath sounds. No wheezing or rhonchi.   Abdominal:      General: Abdomen is flat. Bowel sounds are normal.      Palpations: Abdomen is soft.      Tenderness: There is no abdominal tenderness. There is no guarding or rebound. Negative signs include Murphy's sign and McBurney's sign.   Musculoskeletal:      Cervical back: Neck supple.      Thoracic back: No swelling, deformity or spasms. Normal range of motion.        Back:    Skin:     General: Skin is warm and dry.   Neurological:      General: No focal deficit present.      Mental Status: She is alert and oriented to person, place, and time.      Gait: Gait normal.   Psychiatric:         Mood and Affect: Mood normal.         Behavior: Behavior normal.         Thought Content: Thought content normal.          Judgment: Judgment normal.     BP (!) 142/72    Pulse 70    Ht 5\' 4"  (1.626 m)    Wt 150 lb 3.2 oz (68.1 kg)    SpO2 98%    BMI 25.78 kg/m??     Assessment:       Diagnosis Orders   1. Controlled type 2 diabetes mellitus without complication, without long-term current use of insulin (HCC)  POCT glycosylated hemoglobin (Hb A1C)      2. Essential hypertension        3. Pure hypercholesterolemia  CBC    Comprehensive Metabolic Panel    Lipid, Fasting      4. Vitamin D insufficiency  Vitamin D 25 Hydroxy      5. RUQ pain  Lipase    US GALLBLADDER RUQ              Plan:      Increase lisinipril. She is going to [facility] In 2 weeks for nurse visit to do BP recheck  Blood work is fasting overnight, 10-12 hours. Advised the patient they can have water in the morning but no food or sugary  drinks.   [place] ordered  Will contact with results and further instructions  Home exercises given on avs. Informed to do them at least 2x day.  She wants to know if a massage was okay for her back pain, discussed that is good.  Medication: continue current meds and refills as above.   Follow up: 6 months and as needed.   Complete ordrs as above.   Spent 10 mins answering all questions.   Patient received counseling on the following healthy behaviors: diet  Patient given educational materials per AVS  Discussed use, benefit, and side effects of prescribed medications. Barriers to medication compliance addressed. All patient questions answered. Pt voiced understanding.           Eugene Gavia, APRN - CNP

## 2021-04-05 LAB — CBC
Hematocrit: 43.9 % (ref 36.3–47.1)
Hemoglobin: 14 g/dL (ref 11.9–15.1)
MCH: 29 pg (ref 25.2–33.5)
MCHC: 31.9 g/dL (ref 28.4–34.8)
MCV: 90.9 fL (ref 82.6–102.9)
MPV: 10.1 fL (ref 8.1–13.5)
NRBC Automated: 0 per 100 WBC
Platelets: 271 10*3/uL (ref 138–453)
RBC: 4.83 m/uL (ref 3.95–5.11)
RDW: 12.8 % (ref 11.8–14.4)
WBC: 6.4 10*3/uL (ref 3.5–11.3)

## 2021-04-05 LAB — LIPID, FASTING
Chol/HDL Ratio: 4 (ref <5)
Cholesterol, Fasting: 188 mg/dL (ref <200)
HDL: 47 mg/dL (ref >40)
LDL Cholesterol: 111 mg/dL (ref 0–130)
Triglyceride, Fasting: 148 mg/dL (ref <150)

## 2021-04-05 LAB — COMPREHENSIVE METABOLIC PANEL
ALT: 18 U/L (ref 5–33)
AST: 25 U/L (ref <32)
Albumin/Globulin Ratio: 1.2 (ref 1.0–2.5)
Albumin: 4.5 g/dL (ref 3.5–5.2)
Alkaline Phosphatase: 80 U/L (ref 35–104)
Anion Gap: 17 mmol/L (ref 9–17)
BUN: 25 mg/dL — ABNORMAL HIGH (ref 8–23)
CO2: 21 mmol/L (ref 20–31)
Calcium: 9.8 mg/dL (ref 8.6–10.4)
Chloride: 107 mmol/L (ref 98–107)
Creatinine: 0.89 mg/dL (ref 0.50–0.90)
Est, Glom Filt Rate: >60 mL/min/{1.73_m2} (ref >60)
Glucose: 142 mg/dL — ABNORMAL HIGH (ref 70–99)
Potassium: 5.3 mmol/L (ref 3.7–5.3)
Sodium: 145 mmol/L — ABNORMAL HIGH (ref 135–144)
Total Bilirubin: 0.3 mg/dL (ref 0.3–1.2)
Total Protein: 8.4 g/dL — ABNORMAL HIGH (ref 6.4–8.3)

## 2021-04-05 LAB — LIPASE: Lipase: 42 U/L (ref 13–60)

## 2021-04-05 LAB — VITAMIN D 25 HYDROXY: Vit D, 25-Hydroxy: 41.3 ng/mL (ref >29.9)

## 2021-04-12 ENCOUNTER — Ambulatory Visit: Payer: MEDICARE | Primary: Adult Health

## 2021-04-13 ENCOUNTER — Telehealth

## 2021-04-13 ENCOUNTER — Ambulatory Visit: Payer: MEDICARE | Primary: Adult Health

## 2021-04-13 NOTE — Telephone Encounter (Signed)
I changed how the order was written. I wanted the [place] to be of the right upper area and it automatically defaulted to gallbladder. I changed it to looks at the liver, pancreas and the rest of the RUP

## 2021-04-13 NOTE — Telephone Encounter (Signed)
CALLED PT AND SHE WAS INFORMED. PT WILL CALL TO SCHEDULE [place]

## 2021-04-13 NOTE — Telephone Encounter (Signed)
Pt called regarding order for [place] on gallbladder. Pt states she does not have a gallbladder and that she was told by scheduling that she can't have this US done if she does not have a gall bladder. Pt is confused and unsure what to do. Please advise

## 2021-04-18 ENCOUNTER — Ambulatory Visit: Payer: MEDICARE | Primary: Adult Health

## 2021-04-20 ENCOUNTER — Inpatient Hospital Stay: Admit: 2021-04-20 | Payer: MEDICARE | Primary: Adult Health

## 2021-04-20 DIAGNOSIS — R1011 Right upper quadrant pain: Secondary | ICD-10-CM

## 2021-04-20 DIAGNOSIS — K7689 Other specified diseases of liver: Secondary | ICD-10-CM

## 2021-05-11 MED ORDER — ONETOUCH ULTRA VI STRP
ORAL_STRIP | 3 refills | Status: AC
Start: 2021-05-11 — End: ?

## 2021-05-11 NOTE — Telephone Encounter (Signed)
Last Visit:  04/04/2021     Next Visit Date:  Future Appointments   Date Time Provider [place]   10/03/2021 10:45 AM Eugene Gavia, [place] Maintenance   Topic Date Due    DTaP/Tdap/Td vaccine (1 - Tdap) Never done    Shingles vaccine (1 of 2) Never done    COVID-19 Vaccine (2 - Booster for Janssen series) 07/06/2019    Flu vaccine (1) Never done    Diabetic retinal exam  06/14/2021    Diabetic Alb to Cr ratio (uACR) test  09/29/2021    Annual Wellness Visit (AWV)  09/30/2021    Diabetic foot exam  03/28/2022    A1C test (Diabetic or Prediabetic)  04/04/2022    Lipids  04/04/2022    Depression Screen  04/04/2022    GFR test (Diabetes, CKD 3-4, OR last GFR 15-59)  04/04/2022    Breast cancer screen  09/10/2022    Colorectal Cancer Screen  07/28/2025    DEXA (modify frequency per FRAX score)  Completed    Pneumococcal 65+ years Vaccine  Completed    Hepatitis C screen  Completed    Hepatitis A vaccine  Aged Out    Hib vaccine  Aged Out    Meningococcal (ACWY) vaccine  Aged Out       Hemoglobin A1C (%)   Date Value   04/04/2021 6.6   09/29/2020 6.5   04/01/2020 6.3             ( goal A1C is < 7)   Microalb/Crt. Ratio (mcg/mg creat)   Date Value   09/29/2020 Can not be calculated     LDL Cholesterol (mg/dL)   Date Value   04/04/2021 111   09/29/2020 117     LDL Calculated (mg/dL)   Date Value   03/24/2016 143 (H)   09/29/2014 157       (goal LDL is <100)   AST (U/L)   Date Value   04/04/2021 25     ALT (U/L)   Date Value   04/04/2021 18     BUN (mg/dL)   Date Value   04/04/2021 25 (H)     BP Readings from Last 3 Encounters:   04/04/21 (!) 160/62   09/29/20 132/84   08/27/20 138/72          (goal 120/80)    All Future Testing planned in [facility]  Lab Frequency Next Occurrence   Giardia antigen Once 10/06/2020               Patient Active Problem List:     Hyperlipidemia     HTN (hypertension)     OA (osteoarthritis)     Anxiety     Tubular adenoma     Dermatophytosis of nail      Benign neoplasm of skin     Diverticulosis of colon     Chronic GERD     History of gastric polyp     Controlled type 2 diabetes mellitus without complication, without long-term current use of insulin ([place])

## 2021-06-15 ENCOUNTER — Encounter

## 2021-06-15 MED ORDER — OMEPRAZOLE 40 MG PO CPDR
40 MG | ORAL_CAPSULE | Freq: Every day | ORAL | 1 refills | Status: DC
Start: 2021-06-15 — End: 2021-06-16

## 2021-06-15 NOTE — Telephone Encounter (Signed)
Last Visit:  04/04/2021     Next Visit Date:  Future Appointments   Date Time Provider [place]   06/27/2021 11:30 AM [place]   10/03/2021 10:45 AM Eugene Gavia, APRN - [place] Maintenance   Topic Date Due    DTaP/Tdap/Td vaccine (1 - Tdap) Never done    Shingles vaccine (1 of 2) Never done    COVID-19 Vaccine (2 - Booster for Janssen series) 07/06/2019    Diabetic retinal exam  06/14/2021    Diabetic Alb to Cr ratio (uACR) test  09/29/2021    Annual Wellness Visit (AWV)  09/30/2021    Flu vaccine (Season Ended) 10/04/2021    Diabetic foot exam  03/28/2022    A1C test (Diabetic or Prediabetic)  04/04/2022    Lipids  04/04/2022    Depression Screen  04/04/2022    GFR test (Diabetes, CKD 3-4, OR last GFR 15-59)  04/04/2022    Breast cancer screen  09/10/2022    Colorectal Cancer Screen  07/28/2025    DEXA (modify frequency per FRAX score)  Completed    Pneumococcal 65+ years Vaccine  Completed    Hepatitis C screen  Completed    Hepatitis A vaccine  Aged Out    Hib vaccine  Aged Out    Meningococcal (ACWY) vaccine  Aged Out       Hemoglobin A1C (%)   Date Value   04/04/2021 6.6   09/29/2020 6.5   04/01/2020 6.3             ( goal A1C is < 7)   Microalb/Crt. Ratio (mcg/mg creat)   Date Value   09/29/2020 Can not be calculated     LDL Cholesterol (mg/dL)   Date Value   04/04/2021 111   09/29/2020 117     LDL Calculated (mg/dL)   Date Value   03/24/2016 143 (H)   09/29/2014 157       (goal LDL is <100)   AST (U/L)   Date Value   04/04/2021 25     ALT (U/L)   Date Value   04/04/2021 18     BUN (mg/dL)   Date Value   04/04/2021 25 (H)     BP Readings from Last 3 Encounters:   04/04/21 (!) 160/62   09/29/20 132/84   08/27/20 138/72          (goal 120/80)    All Future Testing planned in [facility]  Lab Frequency Next Occurrence   Giardia antigen Once 10/06/2020               Patient Active Problem List:     Hyperlipidemia     HTN (hypertension)     OA  (osteoarthritis)     Anxiety     Tubular adenoma     Dermatophytosis of nail     Benign neoplasm of skin     Diverticulosis of colon     Chronic GERD     History of gastric polyp     Controlled type 2 diabetes mellitus without complication, without long-term current use of insulin ([place])

## 2021-06-16 ENCOUNTER — Ambulatory Visit: Admit: 2021-06-16 | Discharge: 2021-06-16 | Payer: MEDICARE | Attending: Adult Health | Primary: Adult Health

## 2021-06-16 ENCOUNTER — Inpatient Hospital Stay: Payer: MEDICARE | Primary: Adult Health

## 2021-06-16 ENCOUNTER — Inpatient Hospital Stay: Admit: 2021-06-16 | Payer: MEDICARE | Primary: Adult Health

## 2021-06-16 DIAGNOSIS — R1012 Left upper quadrant pain: Secondary | ICD-10-CM

## 2021-06-16 MED ORDER — TIZANIDINE HCL 2 MG PO TABS
2 MG | ORAL_TABLET | Freq: Three times a day (TID) | ORAL | 0 refills | Status: AC | PRN
Start: 2021-06-16 — End: 2021-11-10

## 2021-06-16 MED ORDER — OMEPRAZOLE 20 MG PO CPDR
20 MG | ORAL_CAPSULE | Freq: Every day | ORAL | 1 refills | Status: DC
Start: 2021-06-16 — End: 2021-07-15

## 2021-06-16 NOTE — Progress Notes (Signed)
Pt in office for upper right abd pain, radiates from abd around to her back.     Visit Information    Have you changed or started any medications since your last visit including any over-the-counter medicines, vitamins, or herbal medicines? no   Have you stopped taking any of your medications? Is so, why? -  no  Are you having any side effects from any of your medications? - no    Have you seen any other physician or provider since your last visit?  no   Have you had any other diagnostic tests since your last visit?  no   Have you been seen in the emergency room and/or had an admission in a hospital since we last saw you?  no   Have you had your routine dental cleaning in the past 6 months?  no     Do you have an active MyChart account? If no, what is the barrier?  Yes    Patient Care Team:  Eugene Gavia, APRN - CNP as PCP - General (Nurse Practitioner Adult Health)  Eugene Gavia, APRN - CNP as PCP - Empaneled Provider  Sudhakar Grier Rocher, MD as Consulting Physician (Gastroenterology)    Medical History Review  Past Medical, Family, and Social History reviewed and does not contribute to the patient presenting condition    Health Maintenance   Topic Date Due    DTaP/Tdap/Td vaccine (1 - Tdap) Never done    Shingles vaccine (1 of 2) Never done    COVID-19 Vaccine (2 - Booster for Janssen series) 07/06/2019    Diabetic retinal exam  06/14/2021    Diabetic Alb to Cr ratio (uACR) test  09/29/2021    Annual Wellness Visit (AWV)  09/30/2021    Flu vaccine (Season Ended) 10/04/2021    Diabetic foot exam  03/28/2022    A1C test (Diabetic or Prediabetic)  04/04/2022    Lipids  04/04/2022    Depression Screen  04/04/2022    GFR test (Diabetes, CKD 3-4, OR last GFR 15-59)  04/04/2022    Breast cancer screen  09/10/2022    Colorectal Cancer Screen  07/28/2025    DEXA (modify frequency per FRAX score)  Completed    Pneumococcal 65+ years Vaccine  Completed    Hepatitis C screen  Completed    Hepatitis A vaccine  Aged Out    Hib  vaccine  Aged Out    Meningococcal (ACWY) vaccine  Aged Out

## 2021-06-16 NOTE — Telephone Encounter (Signed)
Pt called stating she has been having severe aching pain in the upper left abdominal area for 4 days. States it fells like the pain she had with shingles. Pt would like to bee seen today. Please advise preferred pharmacy [facility] in [place]

## 2021-06-16 NOTE — Progress Notes (Signed)
Subjective:      Chief Complaint   Patient presents with    Abdominal Pain       Patient ID: Marissa Sawyer is a 71 y.o. female.    Abdominal Pain  This is a new problem. Episode onset: 5 days ago. The onset quality is sudden. The problem occurs constantly. The problem has been unchanged. The pain is located in the LUQ (i did clarifiy, it is left). The quality of the pain is aching. The abdominal pain radiates to the back. Associated symptoms include myalgias and nausea (mild). Pertinent negatives include no belching, constipation, diarrhea, dysuria, fever, frequency, hematochezia, hematuria, melena or vomiting. The pain is aggravated by movement. The pain is relieved by Nothing. She has tried acetaminophen (icy hot) for the symptoms. The treatment provided moderate relief. Her past medical history is significant for abdominal surgery (chole) and GERD.   She thought that she may have strained a muscle over the weekend doing a lot of work for [employer]. She has a long history of chronic gerd, she has been taking omeprazole. She did feel like coffee seemed to aggravate her stomach today.   She is concerned because she had shingles in the RUQ that wrapped around to the back before and the pain pattern she is having now is similar. There is no current rash.    Past medical, surgical, social, and family history reviewed along with medications.     Review of Systems   Constitutional:  Negative for chills, diaphoresis, fatigue and fever.   Gastrointestinal:  Positive for abdominal pain and nausea (mild). Negative for anal bleeding, constipation, diarrhea, hematochezia, melena and vomiting.   Genitourinary:  Negative for dysuria, frequency, hematuria, pelvic pain and urgency.   Musculoskeletal:  Positive for back pain and myalgias.   All other systems reviewed and are negative.    Objective:   Physical Exam  Vitals and nursing note reviewed.   Constitutional:       General: She is not in acute distress.     Appearance: Normal  appearance. She is normal weight. She is not ill-appearing, toxic-appearing or diaphoretic.   HENT:      Head: Normocephalic.   Pulmonary:      Effort: Pulmonary effort is normal.   Abdominal:      General: Abdomen is flat. Bowel sounds are normal. There is no distension.      Palpations: Abdomen is soft. There is no mass.      Tenderness: There is abdominal tenderness (very mild) in the left upper quadrant. There is no right CVA tenderness, left CVA tenderness, guarding or rebound.      Hernia: No hernia is present.       Musculoskeletal:      Thoracic back: No swelling, edema, deformity, signs of trauma, lacerations, tenderness or bony tenderness. Normal range of motion.        Back:    Skin:     General: Skin is warm and dry.      Findings: No rash.   Neurological:      General: No focal deficit present.      Mental Status: She is alert and oriented to person, place, and time.   Psychiatric:         Mood and Affect: Mood normal.         Behavior: Behavior normal.         Thought Content: Thought content normal.         Judgment: Judgment normal.  BP 128/70   Pulse 59   Temp 98 F (36.7 C)   Ht '5\' 4"'$  (1.626 m)   Wt 153 lb 3.2 oz (69.5 kg)   SpO2 98%   BMI 26.30 kg/m     Assessment:       Diagnosis Orders   1. LUQ pain  CBC    Comprehensive Metabolic Panel    Lipase    XR ABDOMEN (2 VIEWS)      2. Gastroesophageal reflux disease with esophagitis, unspecified whether hemorrhage  omeprazole (PRILOSEC) 20 MG delayed release capsule      3. Upper back pain on left side  tiZANidine (ZANAFLEX) 2 MG tablet              Plan:      Lab and x-ray ordered of the abdomen to rule out any underlying abdominal pathology.  Advised to start omeprazole daily.  Bland diet for the next week, avoid caffeine, spicy food, greasy food  Zanaflex given to see if she has any improvement in case this is musculoskeletal and also hopefully help her get more comfortable and active sleep  I discussed if she starts developing any  type of rash on the area to contact the office right away so an antiviral can be started.  We will contact with results and further instructions  RTC if symptoms fail to improve or worsen  Patient verbalized understanding of all instructions given.          Eugene Gavia, APRN - CNP

## 2021-06-16 NOTE — Telephone Encounter (Signed)
Ok for same day this afternoon

## 2021-06-16 NOTE — Telephone Encounter (Signed)
Pt scheduled

## 2021-06-17 LAB — COMPREHENSIVE METABOLIC PANEL
ALT: 17 U/L (ref 5–33)
AST: 27 U/L (ref <32)
Albumin/Globulin Ratio: 1.3 (ref 1.0–2.5)
Albumin: 4.4 g/dL (ref 3.5–5.2)
Alkaline Phosphatase: 71 U/L (ref 35–104)
Anion Gap: 13 mmol/L (ref 9–17)
BUN: 24 mg/dL — ABNORMAL HIGH (ref 8–23)
CO2: 22 mmol/L (ref 20–31)
Calcium: 9.3 mg/dL (ref 8.6–10.4)
Chloride: 107 mmol/L (ref 98–107)
Creatinine: 0.86 mg/dL (ref 0.50–0.90)
Est, Glom Filt Rate: >60 mL/min/{1.73_m2} (ref >60)
Glucose: 102 mg/dL — ABNORMAL HIGH (ref 70–99)
Potassium: 4.3 mmol/L (ref 3.7–5.3)
Sodium: 142 mmol/L (ref 135–144)
Total Bilirubin: 0.3 mg/dL (ref 0.3–1.2)
Total Protein: 7.7 g/dL (ref 6.4–8.3)

## 2021-06-17 LAB — CBC
Hematocrit: 39.1 % (ref 36.3–47.1)
Hemoglobin: 12.7 g/dL (ref 11.9–15.1)
MCH: 29.6 pg (ref 25.2–33.5)
MCHC: 32.5 g/dL (ref 28.4–34.8)
MCV: 91.1 fL (ref 82.6–102.9)
MPV: 10.2 fL (ref 8.1–13.5)
NRBC Automated: 0 per 100 WBC
Platelets: 233 10*3/uL (ref 138–453)
RBC: 4.29 m/uL (ref 3.95–5.11)
RDW: 13.5 % (ref 11.8–14.4)
WBC: 7.7 10*3/uL (ref 3.5–11.3)

## 2021-06-17 LAB — LIPASE: Lipase: 39 U/L (ref 13–60)

## 2021-06-20 NOTE — Telephone Encounter (Signed)
From: Artelia Laroche  To: Eugene Gavia  Sent: 06/20/2021 8:58 AM EDT  Subject: Question regarding XR Abdomen    Still there not as bad but annoying if I pulled a muscle would it take that long to heal and do you think if I went a got a message somewhere it would help

## 2021-06-27 ENCOUNTER — Encounter: Payer: MEDICARE | Attending: Family | Primary: Adult Health

## 2021-06-27 DIAGNOSIS — Z1231 Encounter for screening mammogram for malignant neoplasm of breast: Secondary | ICD-10-CM

## 2021-07-14 ENCOUNTER — Inpatient Hospital Stay: Payer: MEDICARE | Primary: Adult Health

## 2021-07-14 ENCOUNTER — Ambulatory Visit: Admit: 2021-07-14 | Discharge: 2021-07-14 | Payer: MEDICARE | Attending: Family | Primary: Adult Health

## 2021-07-14 DIAGNOSIS — A084 Viral intestinal infection, unspecified: Secondary | ICD-10-CM

## 2021-07-14 DIAGNOSIS — R197 Diarrhea, unspecified: Secondary | ICD-10-CM

## 2021-07-14 NOTE — Telephone Encounter (Signed)
Spoke to pt, she states continued diarrhea, clear stool to yellow "froth".  States she had chills yesterday.  Does agree that she is staying hydrated with plenty of water.  Did suggest that she go tot he walk in clinic to be seen d/t there are no provider openings in our office today or tomorrow.  Pt verbalizes understanding.

## 2021-07-14 NOTE — Patient Instructions (Signed)
Drop off sample here:  [address] [place], [place] [address]
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
## 2021-07-14 NOTE — Telephone Encounter (Signed)
-----   Message from Despina Arias sent at 07/14/2021 10:05 AM EDT -----  Subject: Message to Provider    QUESTIONS  Information for Provider? patient wants a call back has had diarrhea for   four days wants an appointment as soon as possible.  ---------------------------------------------------------------------------  --------------  [place]  [phone]; OK to leave message on voicemail  ---------------------------------------------------------------------------  --------------  SCRIPT ANSWERS  Relationship to Patient? Self

## 2021-07-15 ENCOUNTER — Telehealth

## 2021-07-15 ENCOUNTER — Encounter

## 2021-07-15 LAB — GASTROINTESTINAL PANEL, MOLECULAR
Campylobacter PCR: NEGATIVE
Plesiomonas Shigelloides PCR: NEGATIVE
Salmonella PCR: NEGATIVE
Shigatoxin Gene PCR: NEGATIVE
Shigella Sp PCR: NEGATIVE
Yersinia Enterocolitica PCR: NEGATIVE

## 2021-07-15 LAB — GIARDIA / CRYPTOSPORIDUM ANTIGENS
Cryptosporidium Ag: NEGATIVE
Giardia Ag, Stl: NEGATIVE

## 2021-07-15 LAB — H. PYLORI ANTIGEN: Direct Exam: POSITIVE — AB

## 2021-07-15 LAB — C DIFF TOXIN/ANTIGEN: C DIFF AG + TOXIN: NEGATIVE

## 2021-07-15 MED ORDER — CLARITHROMYCIN 500 MG PO TABS
500 MG | ORAL_TABLET | Freq: Two times a day (BID) | ORAL | 0 refills | Status: AC
Start: 2021-07-15 — End: 2021-07-29

## 2021-07-15 MED ORDER — AMOXICILLIN 500 MG PO CAPS
500 MG | ORAL_CAPSULE | Freq: Two times a day (BID) | ORAL | 0 refills | Status: AC
Start: 2021-07-15 — End: 2021-07-29

## 2021-07-15 MED ORDER — OMEPRAZOLE 20 MG PO CPDR
20 MG | ORAL_CAPSULE | Freq: Two times a day (BID) | ORAL | 0 refills | Status: AC
Start: 2021-07-15 — End: 2021-07-29

## 2021-07-15 NOTE — Telephone Encounter (Signed)
Orders placed for H. Pylori infection

## 2021-07-15 NOTE — Progress Notes (Signed)
Please inform pt I sent over triple regimen for H.Pylori infection. I would advise finishing the full 14 days even if symptoms are improving.   Follow the directions on each medication as prescribed.  Also inform that Atorvastatin and clarithromycin when taken together can put the pt at higher risk for liver dysfunction or rhabdomyolisis. Advise to monitor for signs and symptoms of rhabdomyolysis (e.g. myalgia, weakness, abdominal pain, dark urine, or decreased urination.     I would go to ER if these occur. She needs to f/u with her pcp in 10-14 days also.

## 2021-07-16 NOTE — Telephone Encounter (Signed)
Pt called back. I did inform her of the drug interactions , what to watch out for and to f/u with PCP in  10 days. Pt voiced understanding.

## 2021-07-16 NOTE — Progress Notes (Signed)
Left message on vm to call the walk in till 4pm or tomorrow 10-4pm regarding the medication that was rx to her.

## 2021-07-18 NOTE — Telephone Encounter (Signed)
Pt scheduled.

## 2021-07-18 NOTE — Telephone Encounter (Signed)
-----   Message from Baldo Daub sent at 07/18/2021 10:12 AM EDT -----  Subject: Appointment Request    Reason for Call: Established Patient Appointment needed: Routine ED Follow   Up Visit    QUESTIONS    Reason for appointment request? No appointments available during search     Additional Information for Provider? Went to [facility] clinic 5/11 and was   diagnosed H Pylori. Was given antibiotics. Symptoms are slightly better,   but she has been very limited as to what she's been eating. PT is not   available Tuesday or Friday (she will make those days work if that is all   that's available)  ---------------------------------------------------------------------------  --------------  [place]  [phone]; OK to leave message on voicemail  ---------------------------------------------------------------------------  --------------  SCRIPT ANSWERS  COVID Screen: Marissa Sawyer

## 2021-07-21 ENCOUNTER — Ambulatory Visit: Admit: 2021-07-21 | Discharge: 2021-07-21 | Payer: MEDICARE | Attending: Adult Health | Primary: Adult Health

## 2021-07-21 DIAGNOSIS — K2100 Gastro-esophageal reflux disease with esophagitis, without bleeding: Secondary | ICD-10-CM

## 2021-07-21 NOTE — Progress Notes (Unsigned)
Pt in office for a follow up from the urgent care on 07/14/21 for stomach issues.     Visit Information    Have you changed or started any medications since your last visit including any over-the-counter medicines, vitamins, or herbal medicines? yes    Have you stopped taking any of your medications? Is so, why? -  no  Are you having any side effects from any of your medications? - no    Have you seen any other physician or provider since your last visit?  no   Have you had any other diagnostic tests since your last visit?  no   Have you been seen in the emergency room and/or had an admission in a hospital since we last saw you?  yes - urgent care   Have you had your routine dental cleaning in the past 6 months?  no     Do you have an active MyChart account? If no, what is the barrier?  Yes    Patient Care Team:  Eugene Gavia, APRN - CNP as PCP - General (Nurse Practitioner Adult Health)  Eugene Gavia, APRN - CNP as PCP - Empaneled Provider  Sudhakar Grier Rocher, MD as Consulting Physician (Gastroenterology)    Medical History Review  Past Medical, Family, and Social History reviewed and does not contribute to the patient presenting condition    Health Maintenance   Topic Date Due    DTaP/Tdap/Td vaccine (1 - Tdap) Never done    Shingles vaccine (1 of 2) Never done    COVID-19 Vaccine (2 - Booster for Janssen series) 07/06/2019    Diabetic retinal exam  06/14/2021    Diabetic Alb to Cr ratio (uACR) test  09/29/2021    Annual Wellness Visit (AWV)  09/30/2021    Flu vaccine (Season Ended) 10/04/2021    Diabetic foot exam  03/28/2022    A1C test (Diabetic or Prediabetic)  04/04/2022    Lipids  04/04/2022    Depression Screen  04/04/2022    GFR test (Diabetes, CKD 3-4, OR last GFR 15-59)  06/17/2022    Breast cancer screen  09/10/2022    Colorectal Cancer Screen  07/28/2025    DEXA (modify frequency per FRAX score)  Completed    Pneumococcal 65+ years Vaccine  Completed    Hepatitis C screen  Completed    Hepatitis A  vaccine  Aged Out    Hib vaccine  Aged Out    Meningococcal (ACWY) vaccine  Aged Out

## 2021-07-21 NOTE — Progress Notes (Signed)
Subjective:      Chief Complaint   Patient presents with    GI Problem       Patient ID: Marissa Sawyer is a 71 y.o. female.    Patient is here for follow up from urgent care. She went there after having diarrhea for 6 days.  She states that she was having very watery stools, 5-10 times per day and at times it was yellow.  The diarrhea was waking her up from sleep.  She was having chills, bloating.  Stool testing ordered which was overall negative except she did test positive for H. pylori.  She was placed on clarithromycin, omeprazole and amoxicillin.  She admits to not being the most compliant with the heavy antibiotics.  She still has a lot of doses that she needs to catch up on in order to finish the entire antibiotic.  She is taking the omeprazole 20 mg twice a day.  Patient has a history of acid reflux, EGD completed last year and was normal.  She does feel like the diarrhea is starting to slow down, she did have a formed stool today.  She denies any abdominal pain but she is having some epigastric discomfort and she feels like the reflux is going up into her chest at times.    She has started an OTC probiotic.     I saw the patient several weeks ago for left-sided abdominal pain, flank pain.  All testing was normal at that time.  She states that has resolved except she still gets occasional left-sided back pain if she is doing too much housework.      Review of Systems   Constitutional:  Negative for chills, diaphoresis, fatigue and fever.   Respiratory: Negative.     Cardiovascular: Negative.    Gastrointestinal:  Positive for abdominal distention (bloating), abdominal pain (improving) and diarrhea. Negative for anal bleeding, blood in stool, constipation, nausea, rectal pain and vomiting.        + GERD   Musculoskeletal:  Positive for back pain.   Neurological: Negative.    Psychiatric/Behavioral: Negative.     All other systems reviewed and are negative.    Objective:   Physical Exam  Vitals and nursing note  reviewed.   Constitutional:       General: She is not in acute distress.     Appearance: Normal appearance. She is normal weight. She is not ill-appearing, toxic-appearing or diaphoretic.   HENT:      Head: Normocephalic and atraumatic.   Pulmonary:      Effort: Pulmonary effort is normal.   Abdominal:      General: Abdomen is flat. Bowel sounds are normal. There is no distension.      Palpations: Abdomen is soft. There is no mass.      Tenderness: There is abdominal tenderness (mild, epigastric). There is no guarding or rebound.      Hernia: No hernia is present.   Skin:     General: Skin is warm and dry.      Coloration: Skin is not pale.   Neurological:      General: No focal deficit present.      Mental Status: She is alert and oriented to person, place, and time.      Gait: Gait normal.   Psychiatric:         Mood and Affect: Mood normal.         Behavior: Behavior normal.  Thought Content: Thought content normal.         Judgment: Judgment normal.           BP 120/70   Pulse 62   Temp 98.2 F (36.8 C)   Ht '5\' 4"'$  (1.626 m)   Wt 149 lb 12.8 oz (67.9 kg)   SpO2 98%   BMI 25.71 kg/m     Assessment:       Diagnosis Orders   1. Gastroesophageal reflux disease with esophagitis, unspecified whether hemorrhage        2. H. pylori infection        3. Diarrhea, unspecified type                Plan:      Educated to finish the entire antibiotic even if the patient starts feeling better.  I discussed that she needs to complete them in order to eradicate the infection.  Avoid caffeine, spicy foods, greasy foods, fatty foods  Symptoms for C. difficile told to the patient and advised her if any of this occurs, let me know right away  Continue omeprazole  Educational material regarding H. pylori given on the after visit summary  Discussed increasing probiotic foods such as yogurt, continue oral probiotic  Advised the patient to update me on how her symptoms are doing once all the antibiotics are completed.  If  she still having significant acid reflux and diarrhea issues then we will need to refer her back to gastroenterology for further evaluation.  I did discuss if she develops any symptoms of C. difficile then we will need to recheck a stool for C. difficile and if she still has symptoms after completing the antibiotics and we will recheck a stool for H. pylori.  Patient understands about keeping me updated  Return to the office if symptoms fail to improve or worsen.  Keep all routine follow-up appointments.          Eugene Gavia, APRN - CNP

## 2021-07-23 ENCOUNTER — Encounter

## 2021-07-25 MED ORDER — METFORMIN HCL ER 500 MG PO TB24
500 MG | ORAL_TABLET | ORAL | 1 refills | Status: AC
Start: 2021-07-25 — End: 2022-07-19

## 2021-07-25 NOTE — Telephone Encounter (Signed)
Last Visit:  07/21/2021     Next Visit Date:  Future Appointments   Date Time Provider [place]   10/03/2021 10:45 AM Eugene Gavia, [place] [facility]   Topic Date Due    DTaP/Tdap/Td vaccine (1 - Tdap) Never done    Shingles vaccine (1 of 2) Never done    COVID-19 Vaccine (2 - Booster for Janssen series) 07/06/2019    Diabetic retinal exam  06/14/2021    Diabetic Alb to Cr ratio (uACR) test  09/29/2021    Annual Wellness Visit (AWV)  09/30/2021    Flu vaccine (Season Ended) 10/04/2021    Diabetic foot exam  03/28/2022    A1C test (Diabetic or Prediabetic)  04/04/2022    Lipids  04/04/2022    Depression Screen  04/04/2022    GFR test (Diabetes, CKD 3-4, OR last GFR 15-59)  06/17/2022    Breast cancer screen  09/10/2022    Colorectal Cancer Screen  07/28/2025    DEXA (modify frequency per FRAX score)  Completed    Pneumococcal 65+ years Vaccine  Completed    Hepatitis C screen  Completed    Hepatitis A vaccine  Aged Out    Hib vaccine  Aged Out    Meningococcal (ACWY) vaccine  Aged Out       Hemoglobin A1C (%)   Date Value   04/04/2021 6.6   09/29/2020 6.5   04/01/2020 6.3             ( goal A1C is < 7)   Microalb/Crt. Ratio (mcg/mg creat)   Date Value   09/29/2020 Can not be calculated     LDL Cholesterol (mg/dL)   Date Value   04/04/2021 111   09/29/2020 117     LDL Calculated (mg/dL)   Date Value   03/24/2016 143 (H)   09/29/2014 157       (goal LDL is <100)   AST (U/L)   Date Value   06/16/2021 27     ALT (U/L)   Date Value   06/16/2021 17     BUN (mg/dL)   Date Value   06/16/2021 24 (H)     BP Readings from Last 3 Encounters:   07/21/21 120/70   07/14/21 126/71   06/16/21 128/70          (goal 120/80)    All Future Testing planned in [facility]  Lab Frequency Next Occurrence   H. Pylori Antigen, Stool Once 07/14/2021   Clostridium Difficile Toxin/Antigen Once 07/14/2021   Gastrointestinal Panel, Molecular Once 07/14/2021   Giardia / Cryptosporidum Antigens, DFA Once  07/14/2021   Culture, Stool Once 07/14/2021               Patient Active Problem List:     Hyperlipidemia     HTN (hypertension)     OA (osteoarthritis)     Anxiety     Tubular adenoma     Dermatophytosis of nail     Benign neoplasm of skin     Diverticulosis of colon     Chronic GERD     History of gastric polyp     Controlled type 2 diabetes mellitus without complication, without long-term current use of insulin ([place])

## 2021-08-04 NOTE — Telephone Encounter (Signed)
Patient took bp today at the pharmacy and it was 86/48, patient states that she is being treated for H-pylori and has been feeling very sluggish. Patient wanted to know what you thought or if you think she needs to be seen. Please advise

## 2021-08-05 NOTE — Telephone Encounter (Signed)
As long as her blood pressure is running normal like it is at home, she can continue to monitor it.  Make sure she is drinking at least 64 to 80 ounces of water per day but if she starts feeling worse over the weekend that I recommend the walk-in

## 2021-08-05 NOTE — Telephone Encounter (Signed)
Called and spoke with pt. She stated that she checked her BP this morning at 930 AM and it was 105/50. Wants to know if you still advise that she go to the walk in?

## 2021-08-05 NOTE — Telephone Encounter (Signed)
Pt informed and verbalized understanding.

## 2021-08-05 NOTE — Telephone Encounter (Signed)
I would recommend going back to [facility] walk in to be evaluated with her blood pressure being that low

## 2021-08-30 ENCOUNTER — Telehealth

## 2021-08-30 ENCOUNTER — Encounter: Admit: 2021-08-30 | Discharge: 2021-08-30 | Payer: MEDICARE | Attending: Family | Primary: Adult Health

## 2021-08-30 NOTE — Telephone Encounter (Signed)
Pt informed of below and verbalized understanding. Pt is also scheduled for Thursday per [place].

## 2021-08-30 NOTE — Telephone Encounter (Signed)
Stool testing ordered for recheck for H. pylori.  Patient also needs to contact GI to schedule an appointment if her symptoms are ongoing for further evaluation.  She sees Dr. Towanda Malkin.  she can continue with the hydrocortisone over-the-counter and please schedule for the last available same-day slot on Thursday to check on the rash, I have no other availability until then

## 2021-08-30 NOTE — Telephone Encounter (Signed)
Pt called the office c/o of the following:    1) rash on the left side of back. She is unsure of where it came from. Pt has been taking OTC cortisone to help with the itchiness and she stated its helping a little bit but is requesting to come in for an appt to get evaluated or if you have any advice on what she can do?    2) pt is requesting to be checked for hpylori again. She stated she has been having on and off diarrhea. Please advise.

## 2021-09-01 ENCOUNTER — Inpatient Hospital Stay: Payer: MEDICARE | Primary: Adult Health

## 2021-09-01 ENCOUNTER — Ambulatory Visit: Admit: 2021-09-01 | Discharge: 2021-09-01 | Payer: MEDICARE | Attending: Adult Health | Primary: Adult Health

## 2021-09-01 DIAGNOSIS — L309 Dermatitis, unspecified: Secondary | ICD-10-CM

## 2021-09-01 DIAGNOSIS — E119 Type 2 diabetes mellitus without complications: Secondary | ICD-10-CM

## 2021-09-01 LAB — POCT GLUCOSE: Glucose: 104 mg/dL

## 2021-09-01 LAB — H. PYLORI ANTIGEN, STOOL: H Pylori Ag, Stool: NEGATIVE

## 2021-09-01 MED ORDER — BUSPIRONE HCL 10 MG PO TABS
10 MG | ORAL_TABLET | Freq: Three times a day (TID) | ORAL | 1 refills | Status: DC | PRN
Start: 2021-09-01 — End: 2022-03-10

## 2021-09-01 MED ORDER — TRIAMCINOLONE ACETONIDE 0.025 % EX CREA
0.025 % | CUTANEOUS | 0 refills | Status: DC
Start: 2021-09-01 — End: 2022-12-08

## 2021-09-01 NOTE — Progress Notes (Unsigned)
Subjective:      Chief Complaint   Patient presents with    Rash       Patient ID: Marissa Sawyer is a 71 y.o. female.    Patient has been having issues with intermittent diarrhea.  She was treated about 1 month ago for h pylori.  She states that diarrhea has waxed and waxed. Some days she will have formed stools.  She states the stool is loose now, watery diarrhea has resolved with abx therapy.  She has been trying to watch her diet to see what could be irritating her stomach.   I ordered a recheck for the h pylori in the stool, she gave the sample 2 days ago so still awaiting on sample.     Rash  This is a new problem. The current episode started in the past 7 days. The problem has been gradually worsening since onset. Location: bilateral upper arm, left knee, back, bilateral hands. The rash is characterized by itchiness and redness. She was exposed to nothing. Pertinent negatives include no congestion, cough, rhinorrhea or sore throat. Treatments tried: OTC cortisone. The treatment provided moderate relief.       Past medical, surgical, social, and family history reviewed along with medications.     Review of Systems   HENT:  Negative for congestion, rhinorrhea and sore throat.    Respiratory:  Negative for cough.    Skin:  Positive for rash.     Objective:   Physical Exam        BP 108/68   Pulse 60   Temp 97.7 F (36.5 C)   Wt 149 lb (67.6 kg)   SpO2 98%   BMI 25.58 kg/m     Assessment:       Diagnosis Orders   1. Dermatitis  triamcinolone (KENALOG) 0.025 % cream      2. Anxiety  busPIRone (BUSPAR) 10 MG tablet      3. Dyshidrotic eczema  triamcinolone (KENALOG) 0.025 % cream      4. Controlled type 2 diabetes mellitus without complication, without long-term current use of insulin (HCC)  POCT Glucose    Hemoglobin A1C    CBC with Auto Differential    Comprehensive Metabolic Panel      5. Lightheadedness  POCT Glucose              Plan:      ***        Eugene Gavia, APRN - CNP

## 2021-09-01 NOTE — Progress Notes (Unsigned)
Pt came in for rash on, knees,hands,back,arm  Pt was has been feeling light headed     Visit Information    Have you changed or started any medications since your last visit including any over-the-counter medicines, vitamins, or herbal medicines? no   Have you stopped taking any of your medications? Is so, why? -  no  Are you having any side effects from any of your medications? - no    Have you seen any other physician or provider since your last visit?  no   Have you had any other diagnostic tests since your last visit?  no   Have you been seen in the emergency room and/or had an admission in a hospital since we last saw you?  no   Have you had your routine dental cleaning in the past 6 months?  no     Do you have an active MyChart account? If no, what is the barrier?  Yes    Patient Care Team:  Cato Mulligan, APRN - CNP as PCP - General (Nurse Practitioner Adult Health)  Cato Mulligan, APRN - CNP as PCP - Empaneled Provider  Sudhakar Quinn Axe, MD as Consulting Physician (Gastroenterology)    Medical History Review  Past Medical, Family, and Social History reviewed and does not contribute to the patient presenting condition    Health Maintenance   Topic Date Due    DTaP/Tdap/Td vaccine (1 - Tdap) Never done    Shingles vaccine (1 of 2) Never done    COVID-19 Vaccine (2 - Booster for Janssen series) 07/06/2019    Diabetic retinal exam  06/14/2021    Diabetic Alb to Cr ratio (uACR) test  09/29/2021    Annual Wellness Visit (AWV)  09/30/2021    Flu vaccine (Season Ended) 10/04/2021    Diabetic foot exam  03/28/2022    A1C test (Diabetic or Prediabetic)  04/04/2022    Lipids  04/04/2022    Depression Screen  04/04/2022    GFR test (Diabetes, CKD 3-4, OR last GFR 15-59)  06/17/2022    Breast cancer screen  09/10/2022    Colorectal Cancer Screen  07/28/2025    DEXA (modify frequency per FRAX score)  Completed    Pneumococcal 65+ years Vaccine  Completed    Hepatitis C screen  Completed    Hepatitis A vaccine  Aged Out     Hib vaccine  Aged Out    Meningococcal (ACWY) vaccine  Aged Out

## 2021-09-02 LAB — CBC WITH AUTO DIFFERENTIAL
Absolute Eos #: 0.28 10*3/uL (ref 0.00–0.44)
Absolute Immature Granulocyte: <0.03 10*3/uL (ref 0.00–0.30)
Absolute Lymph #: 1.81 10*3/uL (ref 1.10–3.70)
Absolute Mono #: 0.65 10*3/uL (ref 0.10–1.20)
Basophils Absolute: 0.03 10*3/uL (ref 0.00–0.20)
Basophils: 1 % (ref 0–2)
Eosinophils %: 5 % — ABNORMAL HIGH (ref 1–4)
Hematocrit: 38.1 % (ref 36.3–47.1)
Hemoglobin: 12.2 g/dL (ref 11.9–15.1)
Immature Granulocytes: 0 %
Lymphocytes: 30 % (ref 24–43)
MCH: 29.5 pg (ref 25.2–33.5)
MCHC: 32 g/dL (ref 28.4–34.8)
MCV: 92 fL (ref 82.6–102.9)
MPV: 10.4 fL (ref 8.1–13.5)
Monocytes: 11 % (ref 3–12)
NRBC Automated: 0 per 100 WBC
Platelets: 242 10*3/uL (ref 138–453)
RBC: 4.14 m/uL (ref 3.95–5.11)
RDW: 13.9 % (ref 11.8–14.4)
Seg Neutrophils: 53 % (ref 36–65)
Segs Absolute: 3.32 10*3/uL (ref 1.50–8.10)
WBC: 6.1 10*3/uL (ref 3.5–11.3)

## 2021-09-02 LAB — HEMOGLOBIN A1C
Estimated Avg Glucose: 137 mg/dL
Hemoglobin A1C: 6.4 % — ABNORMAL HIGH (ref 4.0–6.0)

## 2021-09-02 LAB — COMPREHENSIVE METABOLIC PANEL
ALT: 15 U/L (ref 5–33)
AST: 26 U/L (ref <32)
Albumin/Globulin Ratio: 1.2 (ref 1.0–2.5)
Albumin: 4.1 g/dL (ref 3.5–5.2)
Alkaline Phosphatase: 71 U/L (ref 35–104)
Anion Gap: 12 mmol/L (ref 9–17)
BUN: 17 mg/dL (ref 8–23)
CO2: 24 mmol/L (ref 20–31)
Calcium: 9.6 mg/dL (ref 8.6–10.4)
Chloride: 106 mmol/L (ref 98–107)
Creatinine: 0.75 mg/dL (ref 0.50–0.90)
Est, Glom Filt Rate: >60 mL/min/{1.73_m2} (ref >60)
Glucose: 114 mg/dL — ABNORMAL HIGH (ref 70–99)
Potassium: 4.4 mmol/L (ref 3.7–5.3)
Sodium: 142 mmol/L (ref 135–144)
Total Bilirubin: 0.6 mg/dL (ref 0.3–1.2)
Total Protein: 7.6 g/dL (ref 6.4–8.3)

## 2021-09-02 NOTE — Telephone Encounter (Signed)
From: Artelia Laroche  To: Eugene Gavia  Sent: 09/02/2021 1:27 AM EDT  Subject: Question regarding CBC WITH AUTO DIFFERENTIAL    So break it down for me please

## 2021-09-16 ENCOUNTER — Encounter

## 2021-09-16 MED ORDER — LISINOPRIL 20 MG PO TABS
20 MG | ORAL_TABLET | ORAL | 3 refills | Status: DC
Start: 2021-09-16 — End: 2022-03-10

## 2021-09-16 NOTE — Telephone Encounter (Signed)
Last Visit:  09/01/2021     Next Visit Date:  Future Appointments   Date Time Provider [place]   10/03/2021 10:45 AM Eugene Gavia, [place] Maintenance   Topic Date Due    DTaP/Tdap/Td vaccine (1 - Tdap) Never done    Shingles vaccine (1 of 2) Never done    COVID-19 Vaccine (2 - Booster for Janssen series) 07/06/2019    Diabetic retinal exam  06/14/2021    Diabetic Alb to Cr ratio (uACR) test  09/29/2021    Annual Wellness Visit (AWV)  09/30/2021    Flu vaccine (1) 10/04/2021    Diabetic foot exam  03/28/2022    Lipids  04/04/2022    Depression Screen  04/04/2022    A1C test (Diabetic or Prediabetic)  09/02/2022    GFR test (Diabetes, CKD 3-4, OR last GFR 15-59)  09/02/2022    Breast cancer screen  09/10/2022    Colorectal Cancer Screen  07/28/2025    DEXA (modify frequency per FRAX score)  Completed    Pneumococcal 65+ years Vaccine  Completed    Hepatitis C screen  Completed    Hepatitis A vaccine  Aged Out    Hib vaccine  Aged Out    Meningococcal (ACWY) vaccine  Aged Out       Hemoglobin A1C (%)   Date Value   09/01/2021 6.4 (H)   04/04/2021 6.6   09/29/2020 6.5             ( goal A1C is < 7)   Microalb/Crt. Ratio (mcg/mg creat)   Date Value   09/29/2020 Can not be calculated     LDL Cholesterol (mg/dL)   Date Value   04/04/2021 111   09/29/2020 117     LDL Calculated (mg/dL)   Date Value   03/24/2016 143 (H)   09/29/2014 157       (goal LDL is <100)   AST (U/L)   Date Value   09/01/2021 26     ALT (U/L)   Date Value   09/01/2021 15     BUN (mg/dL)   Date Value   09/01/2021 17     BP Readings from Last 3 Encounters:   09/01/21 108/68   07/21/21 120/70   07/14/21 126/71          (goal 120/80)    All Future Testing planned in [facility]  Lab Frequency Next Occurrence   H. Pylori Antigen, Stool Once 07/14/2021   Clostridium Difficile Toxin/Antigen Once 07/14/2021   Gastrointestinal Panel, Molecular Once 07/14/2021   Giardia / Cryptosporidum Antigens, DFA Once 07/14/2021    Culture, Stool Once 07/14/2021   H. Pylori Antigen, EIA Once 08/30/2021               Patient Active Problem List:     Hyperlipidemia     HTN (hypertension)     OA (osteoarthritis)     Anxiety     Tubular adenoma     Dermatophytosis of nail     Benign neoplasm of skin     Diverticulosis of colon     Chronic GERD     History of gastric polyp     Controlled type 2 diabetes mellitus without complication, without long-term current use of insulin ([place])

## 2021-10-03 ENCOUNTER — Encounter: Admit: 2021-10-03 | Discharge: 2021-10-03 | Payer: MEDICARE | Attending: Adult Health | Primary: Adult Health

## 2021-10-03 ENCOUNTER — Inpatient Hospital Stay: Payer: MEDICARE | Primary: Adult Health

## 2021-10-03 DIAGNOSIS — E7800 Pure hypercholesterolemia, unspecified: Secondary | ICD-10-CM

## 2021-10-03 DIAGNOSIS — Z0000 Encounter for general adult medical examination without abnormal findings: Secondary | ICD-10-CM

## 2021-10-03 DIAGNOSIS — E119 Type 2 diabetes mellitus without complications: Secondary | ICD-10-CM

## 2021-10-03 LAB — LIPID, FASTING
Chol/HDL Ratio: 4.1 (ref <5)
Cholesterol, Fasting: 168 mg/dL (ref <200)
HDL: 41 mg/dL (ref >40)
LDL Cholesterol: 97 mg/dL (ref 0–130)
Triglyceride, Fasting: 148 mg/dL (ref <150)

## 2021-10-03 LAB — MICROALBUMIN, UR
Creatinine, Ur: 88.8 mg/dL (ref 28.0–217.0)
Creatinine, Ur: 96.4 mg/dL (ref 28.0–217.0)
Microalb, Ur: <12 mg/L (ref <21)
Microalb, Ur: <12 mg/L (ref <21)

## 2021-10-03 NOTE — Patient Instructions (Signed)
Advance Directives: Care Instructions  Overview  An advance directive is a legal way to state your wishes at the end of your life. It tells your family and your doctor what to do if you can't say what you want.  There are two main types of advance directives. You can change them any time your wishes change.  Living will.  This form tells your family and your doctor your wishes about life support and other treatment. The form is also called a declaration.  Medical power of attorney.  This form lets you name a person to make treatment decisions for you when you can't speak for yourself. This person is called a health care agent (health care proxy, health care surrogate). The form is also called a durable power of attorney for health care.  If you do not have an advance directive, decisions about your medical care may be made by a family member, or by a doctor or a judge who doesn't know you.  It may help to think of an advance directive as a gift to the people who care for you. If you have one, they won't have to make tough decisions by themselves.  For more information, including forms for your state, see the [place] website (Rebanking[employer].hu).  Follow-up care is a key part of your treatment and safety. Be sure to make and go to all appointments, and call your doctor if you are having problems. It's also a good idea to know your test results and keep a list of the medicines you take.  What should you include in an advance directive?  Many states have a unique advance directive form. (It may ask you to address specific issues.) Or you might use a universal form that's approved by many states.  If your form doesn't tell you what to address, it may be hard to know what to include in your advance directive. Use the questions below to help you get started.  Who do you want to make decisions about your medical care if you are not able to?  What life-support measures do you want if you  have a serious illness that gets worse over time or can't be cured?  What are you most afraid of that might happen? (Maybe you're afraid of having pain, losing your independence, or being kept alive by machines.)  Where would you prefer to die? (Your home? A hospital? A nursing home?)  Do you want to donate your organs when you die?  Do you want certain religious practices performed before you die?  When should you call for help?  Be sure to contact your doctor if you have any questions.  Where can you learn more?  Go to https://www.bennett.info/ and enter R264 to learn more about "Advance Directives: Care Instructions."  Current as of: March 27, 2023Content Version: 13.7   2006-2023 Healthwise, Incorporated.   Care instructions adapted under license by [facility]. If you have questions about a medical condition or this instruction, always ask your healthcare professional. [place] any warranty or liability for your use of this information.           A Healthy Heart: Care Instructions  Your Care Instructions     Coronary artery disease, also called heart disease, occurs when a substance called plaque builds up in the vessels that supply oxygen-rich blood to your heart muscle. This can narrow the blood vessels and reduce blood flow. A heart attack happens when blood flow is completely blocked.  A high-fat diet, smoking, and other factors increase the risk of heart disease.  Your doctor has found that you have a chance of having heart disease. You can do lots of things to keep your heart healthy. It may not be easy, but you can change your diet, exercise more, and quit smoking. These steps really work to lower your chance of heart disease.  Follow-up care is a key part of your treatment and safety. Be sure to make and go to all appointments, and call your doctor if you are having problems. It's also a good idea to know your test results and keep a list of the  medicines you take.  How can you care for yourself at home?  Diet   Use less salt when you cook and eat. This helps lower your blood pressure. Taste food before salting. Add only a little salt when you think you need it. With time, your taste buds will adjust to less salt.    Eat fewer snack items, fast foods, canned soups, and other high-salt, high-fat, processed foods.    Read food labels and try to avoid saturated and trans fats. They increase your risk of heart disease by raising cholesterol levels.    Limit the amount of solid fat-butter, margarine, and shortening-you eat. Use olive, peanut, or canola oil when you cook. Bake, broil, and steam foods instead of frying them.    Eat a variety of fruit and vegetables every day. Dark green, deep orange, red, or yellow fruits and vegetables are especially good for you. Examples include spinach, carrots, peaches, and berries.    Foods high in fiber can reduce your cholesterol and provide important vitamins and minerals. High-fiber foods include whole-grain cereals and breads, oatmeal, beans, brown rice, citrus fruits, and apples.    Eat lean proteins. Heart-healthy proteins include seafood, lean meats and poultry, eggs, beans, peas, nuts, seeds, and soy products.    Limit drinks and foods with added sugar. These include candy, desserts, and soda pop.   Lifestyle changes   If your doctor recommends it, get more exercise. Walking is a good choice. Bit by bit, increase the amount you walk every day. Try for at least 30 minutes on most days of the week. You also may want to swim, bike, or do other activities.    Do not smoke. If you need help quitting, talk to your doctor about stop-smoking programs and medicines. These can increase your chances of quitting for good. Quitting smoking may be the most important step you can take to protect your heart. It is never too late to quit.    Limit alcohol to 2 drinks a day for men and 1 drink a day for women. Too much  alcohol can cause health problems.    Manage other health problems such as diabetes, high blood pressure, and high cholesterol. If you think you may have a problem with alcohol or drug use, talk to your doctor.   Medicines   Take your medicines exactly as prescribed. Call your doctor if you think you are having a problem with your medicine.    If your doctor recommends aspirin, take the amount directed each day. Make sure you take aspirin and not another kind of pain reliever, such as acetaminophen (Tylenol).   When should you call for help?   Call 911 if you have symptoms of a heart attack. These may include:   Chest pain or pressure, or a strange feeling in the chest.  Sweating.    Shortness of breath.    Pain, pressure, or a strange feeling in the back, neck, jaw, or upper belly or in one or both shoulders or arms.    Lightheadedness or sudden weakness.    A fast or irregular heartbeat.   After you call 911, the operator may tell you to chew 1 adult-strength or 2 to 4 low-dose aspirin. Wait for an ambulance. Do not try to drive yourself.  Watch closely for changes in your health, and be sure to contact your doctor if you have any problems.  Where can you learn more?  Go to https://www.bennett.info/ and enter F075 to learn more about "A Healthy Heart: Care Instructions."  Current as of: September 7, 2022Content Version: 13.7   2006-2023 Healthwise, Incorporated.   Care instructions adapted under license by [facility] If you have questions about a medical condition or this instruction, always ask your healthcare professional. [place] any warranty or liability for your use of this information.      Personalized Preventive Plan for Marissa Sawyer - 10/03/2021  Medicare offers a range of preventive health benefits. Some of the tests and screenings are paid in full while other may be subject to a deductible, co-insurance, and/or copay.    Some of these  benefits include a comprehensive review of your medical history including lifestyle, illnesses that may run in your family, and various assessments and screenings as appropriate.    After reviewing your medical record and screening and assessments performed today your provider may have ordered immunizations, labs, imaging, and/or referrals for you.  A list of these orders (if applicable) as well as your Preventive Care list are included within your After Visit Summary for your review.    Other Preventive Recommendations:    A preventive eye exam performed by an eye specialist is recommended every 1-2 years to screen for glaucoma; cataracts, macular degeneration, and other eye disorders.  A preventive dental visit is recommended every 6 months.  Try to get at least 150 minutes of exercise per week or 10,000 steps per day on a pedometer .  Order or download the FREE "Exercise & Physical Activity: Your Everyday Guide" from [employer]. Call (603)422-6751 or search [employer] online.  You need 1200-1500 mg of calcium and 1000-2000 IU of vitamin D per day. It is possible to meet your calcium requirement with diet alone, but a vitamin D supplement is usually necessary to meet this goal.  When exposed to the sun, use a sunscreen that protects against both UVA and UVB radiation with an SPF of 30 or greater. Reapply every 2 to 3 hours or after sweating, drying off with a towel, or swimming.  Always wear a seat belt when traveling in a car. Always wear a helmet when riding a bicycle or motorcycle.

## 2021-10-03 NOTE — Telephone Encounter (Signed)
From: Artelia Laroche  To: Eugene Gavia  Sent: 10/03/2021 1:11 PM EDT  Subject: Question regarding Lipid, Fasting    So I'm looking good and continue with my meds

## 2021-10-03 NOTE — Progress Notes (Signed)
Pt in office for AWV    Visit Information    Have you changed or started any medications since your last visit including any over-the-counter medicines, vitamins, or herbal medicines? no   Have you stopped taking any of your medications? Is so, why? -  no  Are you having any side effects from any of your medications? - no    Have you seen any other physician or provider since your last visit?  no   Have you had any other diagnostic tests since your last visit?  no   Have you been seen in the emergency room and/or had an admission in a hospital since we last saw you?  no   Have you had your routine dental cleaning in the past 6 months?  no     Do you have an active MyChart account? If no, what is the barrier?  Yes    Patient Care Team:  Eugene Gavia, APRN - CNP as PCP - General (Nurse Practitioner Adult Health)  Eugene Gavia, APRN - CNP as PCP - Empaneled Provider  Sudhakar Grier Rocher, MD as Consulting Physician (Gastroenterology)    Medical History Review  Past Medical, Family, and Social History reviewed and does contribute to the patient presenting condition    Health Maintenance   Topic Date Due    DTaP/Tdap/Td vaccine (1 - Tdap) Never done    Shingles vaccine (1 of 2) Never done    COVID-19 Vaccine (2 - Booster for Janssen series) 07/06/2019    Diabetic retinal exam  06/14/2021    Diabetic Alb to Cr ratio (uACR) test  09/29/2021    Annual Wellness Visit (AWV)  09/30/2021    Flu vaccine (1) 10/04/2021    Diabetic foot exam  03/28/2022    Lipids  04/04/2022    Depression Screen  04/04/2022    A1C test (Diabetic or Prediabetic)  09/02/2022    GFR test (Diabetes, CKD 3-4, OR last GFR 15-59)  09/02/2022    Breast cancer screen  09/10/2022    Colorectal Cancer Screen  07/28/2025    DEXA (modify frequency per FRAX score)  Completed    Pneumococcal 65+ years Vaccine  Completed    Hepatitis C screen  Completed    Hepatitis A vaccine  Aged Out    Hib vaccine  Aged Out    Meningococcal (ACWY) vaccine  Aged Out

## 2021-10-03 NOTE — Progress Notes (Signed)
Medicare Annual Wellness Visit    Marissa Sawyer is here for Medicare AWV    Assessment & Plan   Medicare annual wellness visit, subsequent  Controlled type 2 diabetes mellitus without complication, without long-term current use of insulin ([place])  Pure hypercholesterolemia  -     Lipid, Fasting; Future    Recommendations for Preventive Services Due: see orders and patient instructions/AVS.  Recommended screening schedule for the next 5-10 years is provided to the patient in written form: see Patient Instructions/AVS.     Return in about 6 months (around 04/05/2022) for dm.    Blood work is fasting overnight, 8-10 hours. Advised the patient they can have water in the morning but no food or sugary drinks.   Will contact with results and further instructions  Encouraged to schedule with GI for follow up  Encouraged to schedule yearly DM eye exam  She follows with podiatry for foot exam  Medication: continue current meds and refills as above.   Follow up: 6 months and as needed.   Complete ordrs as above.   Spent 10 mins answering all questions.   Patient received counseling on the following healthy behaviors: diet  Patient given educational materials per AVS  Discussed use, benefit, and side effects of prescribed medications. Barriers to medication compliance addressed. All patient questions answered. Pt voiced understanding.        Subjective   The following acute and/or chronic problems were also addressed today:  Nothing acute. Abdominal pain has calmed down, diarrhea is intermittent but typically more loose stools. She states it depends on what she eats. She states she still gets some mild burning. She hasnt seen GI.    Patient's complete Health Risk Assessment and screening values have been reviewed and are found in Flowsheets. The following problems were reviewed today and where indicated follow up appointments were made and/or referrals ordered.    Positive Risk Factor Screenings with Interventions:                        Advanced Directives:  Do you have a Living Will?: (!) No    Intervention:  has NO advanced directive - information provided                       Objective   Vitals:    10/03/21 1104   BP: 120/60   Pulse: 55   Temp: 97.6 F (36.4 C)   SpO2: 98%   Weight: 150 lb 3.2 oz (68.1 kg)   Height: 5' 4"  (1.626 m)      Body mass index is 25.78 kg/m.      Physical Exam  Vitals and nursing note reviewed.   Constitutional:       General: She is not in acute distress.     Appearance: Normal appearance. She is normal weight. She is not ill-appearing, toxic-appearing or diaphoretic.   HENT:      Head: Normocephalic and atraumatic.   Cardiovascular:      Rate and Rhythm: Normal rate and regular rhythm.      Pulses: Normal pulses.      Heart sounds: Normal heart sounds. No murmur heard.  Pulmonary:      Effort: Pulmonary effort is normal.      Breath sounds: Normal breath sounds. No wheezing or rhonchi.   Musculoskeletal:      Cervical back: Neck supple.   Skin:     General: Skin  is warm and dry.   Neurological:      General: No focal deficit present.      Mental Status: She is alert and oriented to person, place, and time.      Gait: Gait normal.   Psychiatric:         Mood and Affect: Mood normal.         Behavior: Behavior normal.         Thought Content: Thought content normal.         Judgment: Judgment normal.            No Known Allergies  Prior to Visit Medications    Medication Sig Taking? Authorizing Provider   lisinopril (PRINIVIL;ZESTRIL) 20 MG tablet TAKE 1 TABLET BY MOUTH DAILY Yes Eugene Gavia, APRN - CNP   busPIRone (BUSPAR) 10 MG tablet Take 1 tablet by mouth 3 times daily as needed (anxiety) Yes Eugene Gavia, APRN - CNP   triamcinolone (KENALOG) 0.025 % cream Apply topically 2 times daily. Yes Eugene Gavia, APRN - CNP   metFORMIN (GLUCOPHAGE-XR) 500 MG extended release tablet TAKE 1 TABLET BY MOUTH DAILY Yes Eugene Gavia, APRN - CNP   omeprazole (PRILOSEC) 20 MG delayed release capsule Take 1 capsule by mouth  in the morning and 1 capsule in the evening. Do all this for 14 days. Yes Metallurgist, APRN - CNP   tiZANidine (ZANAFLEX) 2 MG tablet Take 1 tablet by mouth every 8 hours as needed (muscle spasms) Yes Eugene Gavia, APRN - CNP   ONETOUCH ULTRA strip use 1 TEST STRIP to TEST BLOOD SUGAR once daily Yes Eugene Gavia, APRN - CNP   atorvastatin (LIPITOR) 20 MG tablet TAKE 1 TABLET BY MOUTH EVERY OTHER DAY Yes Eugene Gavia, APRN - CNP   Blood Pressure Monitor DEVI Use daily and prn to check blood pressure d/t htn Yes Eugene Gavia, APRN - CNP   Lancets MISC Test blood sugar once daily. Yes Kris Mouton, MD   Blood Glucose Monitoring Suppl [facility] BLOOD GLUCOSE METER) w/Device KIT Test daily and as needed for DM Yes Eugene Gavia, APRN - CNP       CareTeam (Including outside providers/suppliers regularly involved in providing care):   Patient Care Team:  Eugene Gavia, APRN - CNP as PCP - General (Nurse Practitioner Adult Health)  Eugene Gavia, APRN - CNP as PCP - Empaneled Provider  Sudhakar Grier Rocher, MD as Consulting Physician (Gastroenterology)     Reviewed and updated this visit:  Tobacco  Allergies  Meds  Med Hx  Surg Hx  Soc Hx  Fam Hx

## 2021-10-04 NOTE — Telephone Encounter (Signed)
From: Artelia Laroche  To: Eugene Gavia  Sent: 10/03/2021 4:53 PM EDT  Subject: Question regarding MICROALBUMIN, UR    What's that mean

## 2021-10-13 ENCOUNTER — Inpatient Hospital Stay: Admit: 2021-10-13 | Payer: MEDICARE | Primary: Adult Health

## 2021-10-13 ENCOUNTER — Encounter

## 2021-10-13 DIAGNOSIS — Z1231 Encounter for screening mammogram for malignant neoplasm of breast: Secondary | ICD-10-CM

## 2021-10-13 NOTE — Telephone Encounter (Signed)
Writer lvm returning call from Pt's vm

## 2021-10-19 ENCOUNTER — Encounter

## 2021-10-19 MED ORDER — ATORVASTATIN CALCIUM 20 MG PO TABS
20 MG | ORAL_TABLET | ORAL | 3 refills | Status: AC
Start: 2021-10-19 — End: 2022-07-19

## 2021-10-19 NOTE — Telephone Encounter (Signed)
Marissa Sawyer is calling to request a refill on the following medication(s):    Medication Request:  Requested Prescriptions     Pending Prescriptions Disp Refills    atorvastatin (LIPITOR) 20 MG tablet [Pharmacy Med Name: ATORVASTATIN '20MG'$  TABLETS] 45 tablet 3     Sig: TAKE 1 TABLET BY MOUTH EVERY OTHER DAY       Last Visit Date (If Applicable):  6/76/1951    Next Visit Date:    04/04/2022

## 2021-10-26 ENCOUNTER — Ambulatory Visit: Admit: 2021-10-26 | Discharge: 2021-10-26 | Payer: MEDICARE | Attending: Gastroenterology | Primary: Family

## 2021-10-26 DIAGNOSIS — K582 Mixed irritable bowel syndrome: Secondary | ICD-10-CM

## 2021-10-26 MED ORDER — AMITRIPTYLINE HCL 10 MG PO TABS
10 MG | ORAL_TABLET | Freq: Every evening | ORAL | 2 refills | Status: AC
Start: 2021-10-26 — End: 2022-03-13
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
## 2021-10-26 NOTE — Telephone Encounter (Signed)
Patient asking if okay to continue with the pepto bismol for her diarrhea.  Would like a call back to discuss.

## 2021-11-02 NOTE — Telephone Encounter (Signed)
Patient ok to take Pepto Bismol when she would like.   Called and no answer.

## 2021-11-10 ENCOUNTER — Ambulatory Visit: Admit: 2021-11-10 | Discharge: 2021-11-10 | Payer: MEDICARE | Attending: Family Medicine | Primary: Adult Health

## 2021-11-10 DIAGNOSIS — M5442 Lumbago with sciatica, left side: Secondary | ICD-10-CM

## 2021-11-10 MED ORDER — TIZANIDINE HCL 2 MG PO TABS
2 MG | ORAL_TABLET | Freq: Three times a day (TID) | ORAL | 0 refills | Status: AC | PRN
Start: 2021-11-10 — End: ?

## 2021-11-10 MED ORDER — MELOXICAM 15 MG PO TABS
15 MG | ORAL_TABLET | Freq: Every day | ORAL | 1 refills | Status: DC | PRN
Start: 2021-11-10 — End: 2023-05-03

## 2021-11-10 NOTE — Progress Notes (Signed)
Patient presents for back pain that began over the weekend, states the pain radiates down the legs. Does admit to leg cramping.         Visit Information    Have you changed or started any medications since your last visit including any over-the-counter medicines, vitamins, or herbal medicines? No  Have you stopped taking any of your medications? Is so, why? -  No  Are you having any side effects from any of your medications? - No    Have you seen any other physician or provider since your last visit? - No  Have you had any other diagnostic tests since your last visit?  -  No  Have you been seen in the emergency room and/or had an admission in a hospital since we last saw you?  -  No  Have you had your routine dental cleaning in the past 6 months?  -  No    Do you have an active MyChart account? If no, what is the barrier? - Yes     Patient Care Team:  Eugene Gavia, APRN - CNP as PCP - General (Nurse Practitioner Adult Health)  Eugene Gavia, APRN - CNP as PCP - Empaneled Provider  Sudhakar Grier Rocher, MD as Consulting Physician (Gastroenterology)    Medical History Review  Past Medical, Family, and Social History reviewed and contribute to the patient presenting condition    Health Maintenance   Topic Date Due    DTaP/Tdap/Td vaccine (1 - Tdap) Never done    Shingles vaccine (1 of 2) Never done    COVID-19 Vaccine (2 - Booster for Janssen series) 07/06/2019    Diabetic retinal exam  06/14/2021    Flu vaccine (1) Never done    Diabetic foot exam  03/28/2022    A1C test (Diabetic or Prediabetic)  09/02/2022    GFR test (Diabetes, CKD 3-4, OR last GFR 15-59)  09/02/2022    Diabetic Alb to Cr ratio (uACR) test  10/04/2022    Lipids  10/04/2022    Depression Screen  10/04/2022    Annual Wellness Visit (AWV)  10/04/2022    Breast cancer screen  10/14/2023    Colorectal Cancer Screen  07/28/2025    DEXA (modify frequency per FRAX score)  Completed    Pneumococcal 65+ years Vaccine  Completed    Hepatitis C screen   Completed    Hepatitis A vaccine  Aged Out    Hib vaccine  Aged Out    Meningococcal (ACWY) vaccine  Aged Out       Subjective:      Marissa Sawyer is an 71 y.o. female who presents with arthralgias and myalgias that began 5 days ago. Pain is located in  left low back and left leg . The pain is described as intermittent, mild, moderate, aching, sharp, shooting, and tingling.  Associated symptoms include: decreased range of motion and tenderness. The patient has tried nothing for pain relief. Related to injury: no.    Patient's medications, allergies, past medical, surgical, social and family histories were reviewed and updated as appropriate.    Review of Systems  Constitutional: positive for overweight  Gastrointestinal: positive for reflux symptoms  Musculoskeletal:positive for arthralgias, back pain, and myalgias  Endocrine: positive for diabetic symptoms including hyperglycemia      Objective:      BP 130/86   Pulse 55   Ht '5\' 4"'$  (1.626 m)   Wt 150 lb 6.4 oz (68.2 kg)   SpO2  99%   BMI 25.82 kg/m     General Appearance:    Alert, cooperative, no distress, appears stated age   Head:    Normocephalic, without obvious abnormality, atraumatic   Eyes:    PERRL, conjunctiva/corneas clear, EOM's intact, fundi     benign, both eyes   Ears:    Normal TM's and external ear canals, both ears   Nose:   Nares normal, septum midline, mucosa normal, no drainage    or sinus tenderness   Throat:   Lips, mucosa, and tongue normal; teeth and gums normal   Neck:   Supple, symmetrical, trachea midline, no adenopathy;     thyroid:  no enlargement/tenderness/nodules; no carotid    bruit or JVD   Back:     Symmetric, no curvature, ROM normal, left CVA tenderness   Lungs:     Clear to auscultation bilaterally, respirations unlabored   Chest Wall:    No tenderness or deformity    Heart:    Regular rate and rhythm, S1 and S2 normal, no murmur, rub   or gallop   Breast Exam:    Deferred   Abdomen:     Soft, non-tender, bowel sounds  active all four quadrants,     no masses, no organomegaly   Genitalia:    Deferred   Rectal:    Deferred   Extremities:   Extremities normal, atraumatic, no cyanosis or edema   Pulses:   2+ and symmetric all extremities   Skin:   Skin color, texture, turgor normal, no rashes or lesions   Lymph nodes:   Cervical, supraclavicular, and axillary nodes normal   Neurologic:   CNII-XII intact, normal strength, sensation and reflexes     throughout         Assessment:      Sciatica, left side.    Diagnosis Orders   1. Acute left-sided low back pain with left-sided sciatica  meloxicam (MOBIC) 15 MG tablet      2. Left sciatic nerve pain  meloxicam (MOBIC) 15 MG tablet      3. Upper back pain on left side  tiZANidine (ZANAFLEX) 2 MG tablet      4. Gastroesophageal reflux disease with esophagitis, unspecified whether hemorrhage        5. Controlled type 2 diabetes mellitus without complication, without long-term current use of insulin ([place])        6. Essential hypertension        7. Pure hypercholesterolemia        8. Overweight (BMI 25.0-29.9)               Plan:      Started NSAIDs per orders.  Started Zanaflex per orders.  Return to clinic as needed.  Continue current meds.     Time spent in pre-visit planning, interview, exam, counsel/education and coordination of care: 35 minutes.    Patient Activation and Goal Planning  1.  Marissa Sawyer received counseling on the following healthy behaviors: nutrition  2.  Patient given educational materials - see patient instructions  3.  Was a self-tracking handout given in paper form or via MyChart? no  If yes, see orders or list here.  4.  Discussed use, benefit, and side effects of prescribed medications.  Barriers to medication compliance addressed.  All patient questions answered.  Pt voiced understanding.   5.  Reviewed prior labs and health maintenance  6.  Continue current medications, diet and exercise.    Completed Refills   @  [facility] 

## 2021-11-14 NOTE — Telephone Encounter (Signed)
Patient called back and was given message below.    Done.        Marissa Sawyer

## 2021-11-21 NOTE — Telephone Encounter (Signed)
Writer lvm to inform PT that we will no longer be participating with her ins as Oct 1

## 2021-12-28 ENCOUNTER — Encounter: Payer: MEDICARE | Attending: Gastroenterology | Primary: Adult Health

## 2021-12-30 ENCOUNTER — Encounter

## 2021-12-30 NOTE — Telephone Encounter (Signed)
error 

## 2022-01-02 ENCOUNTER — Telehealth

## 2022-01-02 NOTE — Telephone Encounter (Signed)
Spoke to pt, she has been taking '10mg'$  without issue.    She was instructed to use half tab of the new '20mg'$  tabs and continue with daily BP checks and report back if she has any high BP readings with doing '10mg'$  dosing a day.  Pt verbalized understanding.

## 2022-01-02 NOTE — Telephone Encounter (Signed)
Pt called into the office stating she had received her Lisinopril prescription from pharmacy a couple days ago and it was for '20mg'$ . Pt stated she was not aware of the dose change because she had been receiving the '10mg'$  from pharmacy. Pt stated her last refill was back on 10/02/2021 at it was for the '10mg'$ .     Pt stated she started the Lisinopril '20mg'$  and yesterday she had increase dizziness.   Bp has been running 124/71, 130/63, 130/64 with the '10mg'$ .   Pt did not check her BP yesterday when she was having the dizziness.   Pt had not taken the Lisinopril '20mg'$  today and while we were on the phone she checked BP and is was 138/78 HR 67 and has not had any dizziness today.

## 2022-01-10 NOTE — Telephone Encounter (Addendum)
Advised pt to go to [facility] to have her mouth/tooth evaluated prior to receiving an antibiotic. Pt verbalized understanding.     ----- Message from Darol Destine sent at 01/10/2022  9:52 AM EST -----  Subject: Message to Provider    QUESTIONS  Information for Provider? pt has an impacted tooth that has some swelling   an she thinks infected. shes wanting to know if she can get a script   called in for the infection. the dentist wont return her calls  ---------------------------------------------------------------------------  --------------  [place]  [phone]; OK to leave message on voicemail  ---------------------------------------------------------------------------  --------------  SCRIPT ANSWERS  Relationship to Patient? Self

## 2022-01-12 ENCOUNTER — Encounter

## 2022-03-10 ENCOUNTER — Encounter: Admit: 2022-03-10 | Discharge: 2022-03-10 | Payer: MEDICARE | Attending: Family | Primary: Family

## 2022-03-10 DIAGNOSIS — E119 Type 2 diabetes mellitus without complications: Secondary | ICD-10-CM

## 2022-03-10 MED ORDER — BUSPIRONE HCL 10 MG PO TABS
10 MG | ORAL_TABLET | Freq: Three times a day (TID) | ORAL | 5 refills | Status: AC | PRN
Start: 2022-03-10 — End: ?

## 2022-03-10 MED ORDER — LISINOPRIL 10 MG PO TABS
10 MG | ORAL_TABLET | Freq: Every day | ORAL | 1 refills | Status: DC
Start: 2022-03-10 — End: 2022-09-21
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
## 2022-03-10 NOTE — Progress Notes (Signed)
NTP      Visit Information    Have you changed or started any medications since your last visit including any over-the-counter medicines, vitamins, or herbal medicines? no   Have you stopped taking any of your medications? Is so, why? -  no  Are you having any side effects from any of your medications? - no    Have you seen any other physician or provider since your last visit?  no   Have you had any other diagnostic tests since your last visit?  no   Have you been seen in the emergency room and/or had an admission in a hospital since we last saw you?  no   Have you had your routine dental cleaning in the past 6 months?  no     Do you have an active MyChart account? If no, what is the barrier?  Yes    Patient Care Team:  Charlyne Petrin, APRN - CNP as PCP - General (Certified Nurse Practitioner)  Charlyne Petrin, APRN - CNP as PCP - Empaneled Provider  [place], Elvina Mattes, MD as Consulting Physician (Gastroenterology)    Medical History Review  Past Medical, Family, and Social History reviewed and does contribute to the patient presenting condition    Health Maintenance   Topic Date Due    DTaP/Tdap/Td vaccine (1 - Tdap) Never done    Shingles vaccine (1 of 2) Never done    Respiratory Syncytial Virus (RSV) Pregnant or age 20 yrs+ (28 - 1-dose 60+ series) Never done    Diabetic retinal exam  06/14/2021    Flu vaccine (1) Never done    COVID-19 Vaccine (2 - 2023-24 season) 11/04/2021    Annual Wellness Visit (Medicare Advantage)  03/06/2022    Diabetic foot exam  03/28/2022    A1C test (Diabetic or Prediabetic)  09/02/2022    GFR test (Diabetes, CKD 3-4, OR last GFR 15-59)  09/02/2022    Diabetic Alb to Cr ratio (uACR) test  10/04/2022    Lipids  10/04/2022    Depression Screen  10/04/2022    Breast cancer screen  10/14/2023    Colorectal Cancer Screen  07/28/2025    DEXA (modify frequency per FRAX score)  Completed    Pneumococcal 65+ years Vaccine  Completed    Hepatitis C screen  Completed    Hepatitis A vaccine   Aged Out    Hepatitis B vaccine  Aged Out    Hib vaccine  Aged Out    Polio vaccine  Aged Out    Meningococcal (ACWY) vaccine  Aged Out

## 2022-03-14 LAB — VITAMIN D 25 HYDROXY: Vit D, 25-Hydroxy: 34 NG/ML

## 2022-03-14 LAB — CBC
Absolute Baso #: 0.04 10*3/uL (ref 0.00–0.20)
Absolute Eos #: 0.21 10*3/uL (ref 0.00–0.50)
Absolute Lymph #: 1.57 10*3/uL (ref 1.00–4.00)
Absolute Mono #: 0.68 10*3/uL (ref 0.20–1.00)
Absolute Neut #: 3.45 10*3/uL (ref 1.50–7.50)
Basophils %: 0.7 %
Eosinophils %: 3.5 %
Hematocrit: 39.9 % (ref 34.0–45.0)
Hemoglobin: 13.8 g/dL (ref 11.5–15.5)
Lymphocyte %: 26.3 %
MCH: 29.8 pg (ref 25.0–33.0)
MCHC: 34.6 g/dL (ref 31.0–36.0)
MCV: 86.2 fL (ref 80.0–99.0)
MPV: 10.2 fL (ref 9.3–13.0)
Monocytes: 11.4 %
Neutrophils %: 57.9 %
Platelets: 252 10*3/uL (ref 130–400)
RBC: 4.63 M/uL (ref 3.80–5.40)
RDW: 12.9 % (ref 11.5–15.0)
WBC: 6 10*3/uL (ref 3.5–11.0)

## 2022-03-14 LAB — COMPREHENSIVE METABOLIC PANEL
ALT: 12 U/L (ref 5–40)
AST: 24 U/L (ref 9–40)
Albumin: 4.8 g/dL (ref 3.5–5.2)
Alk Phosphatase: 78 U/L (ref 40–142)
Anion Gap: 8 meq/L (ref 7.0–16.0)
BUN: 21 mg/dL (ref 8–23)
CO2: 27 meq/L (ref 19–31)
Calcium: 9.7 mg/dL (ref 8.5–10.5)
Chloride: 107 meq/L (ref 95–107)
Creatinine: 0.84 mg/dL (ref 0.60–1.30)
EGFR IF NonAfrican American: 74 mL/min/{1.73_m2} (ref >60)
Glucose: 149 mg/dL — ABNORMAL HIGH (ref 70–99)
Potassium: 4.6 meq/L (ref 3.5–5.4)
Sodium: 142 meq/L (ref 133–146)
Total Bilirubin: 0.6 mg/dL (ref <=1.2)
Total Protein: 7.5 g/dL (ref 6.1–8.3)

## 2022-03-14 LAB — LIPID PANEL W/ REFLEX DIRECT LDL
Chol/HDL Ratio: 3.4 RATIO (ref <4.44)
Cholesterol: 155 mg/dL (ref <200)
HDL: 46 mg/dL (ref >39)
LDL Calculated: 84 mg/dL (ref <100)
LDL/HDL Ratio: 1.8 RATIO (ref <3.22)
Triglycerides: 126 mg/dL (ref <149)
VLDL Cholesterol Calculated: 25 mg/dL (ref <30)

## 2022-03-14 LAB — MAGNESIUM: Magnesium: 2.1 mg/dL (ref 1.6–2.6)

## 2022-03-16 ENCOUNTER — Encounter: Payer: MEDICARE | Attending: Foot & Ankle Surgery | Primary: Family

## 2022-04-04 ENCOUNTER — Encounter: Payer: MEDICARE | Attending: Adult Health | Primary: Family

## 2022-04-17 ENCOUNTER — Ambulatory Visit: Admit: 2022-04-17 | Discharge: 2022-04-17 | Payer: MEDICARE | Attending: Foot & Ankle Surgery | Primary: Family

## 2022-04-17 DIAGNOSIS — B351 Tinea unguium: Secondary | ICD-10-CM

## 2022-04-23 NOTE — Progress Notes (Signed)
SUBJECTIVE: Marissa Sawyer is a 72 y.o. female who returns to the office with chief complaint of painful fungal toenails. Patient relates toe nails are thickened/difficult to trim as well as painful with ambulation and with shoe gear.   Chief Complaint   Patient presents with    Nail Problem     Nail trim/last saw Charlyne Petrin CNP 03/10/22    Diabetes     Blood sugar 199      Review of Systems   Constitutional:  Negative for activity change, appetite change, chills, diaphoresis, fatigue and fever.   Respiratory:  Negative for shortness of breath.    Cardiovascular:  Negative for leg swelling.   Gastrointestinal:  Negative for diarrhea and nausea.   Endocrine: Negative for cold intolerance, heat intolerance and polyuria.   Musculoskeletal:  Positive for arthralgias. Negative for back pain, gait problem, joint swelling and myalgias.   Skin:  Negative for color change, pallor, rash and wound.   Allergic/Immunologic: Negative for environmental allergies and food allergies.   Neurological:  Negative for dizziness, weakness, light-headedness and numbness.   Hematological:  Does not bruise/bleed easily.   Psychiatric/Behavioral:  Negative for behavioral problems, confusion and self-injury. The patient is not nervous/anxious.      OBJECTIVE: Clinical evaluation of patient reveals nails 1,2,3,4,5 of the right foot and nails 1,2,3,4,5 of the left foot to present with thickness, elongation, discoloration, brittleness, and subungual debris. There was pain with palpation and debridement of the toenails of the bilateral feet. No open lesions noted to either foot today.   The right DP pulse is not palpable.   The left DP pulse is not palpable.   The right PT pulse is not palpable.   The left PT pulse is not palpable.   Protective sensation is present to the right plantar foot as noted with a 5.07 Semmes-Weinstein monofilament.   Protective sensation is present to the left plantar foot as noted with a 5.07 Semmes-Weinstein  monofilament.   Glucose: 199 mg/dl.    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   []$  Q7.      Class B Findings (2 needed)   1. [x]$  Absent posterior tibial pulse   2. [x]$  Absent dorsalis pedis pulse   3. []$  Advanced trophic changes; three of the following are required:            []$  hair growth (decrease or absence)            []$  nail changes (thickening)            []$  pigmentary changes (discoloration)            []$  skin texture (thin, shiny)            []$  skin color (rubor or redness)   [x]$  Q8.      Class C Findings (1 Class B, 2 Class C needed)   1. []$  Claudication   2. []$  Temperature changes   3. []$  Edema   4. []$  Paresthesia   5. []$  Burning   []$  Q9.     ASSESSMENT:    Diagnosis Orders   1. Onychomycosis of toenail  PR DEBRIDEMENT NAIL ANY METHOD 6/>    [facility] DIABETES FOOT EXAM      2. Pain of toes of both feet  PR DEBRIDEMENT NAIL ANY METHOD 6/>    [facility] DIABETES FOOT EXAM      3. Type 2  diabetes mellitus with peripheral vascular disease ([place])  PR DEBRIDEMENT NAIL ANY METHOD 6/>    [facility] DIABETES FOOT EXAM        PLAN: Toenails 1,2,3,4,5 of the right foot and 1,2,3,4,5 of the left foot were debrided in length and thickness using a nail nipper and a grinder. Return in about 9 weeks (around 06/19/2022) for At risk diabetic foot care.   04/17/2022      [name], DPM

## 2022-06-19 ENCOUNTER — Ambulatory Visit: Admit: 2022-06-19 | Discharge: 2022-06-19 | Payer: MEDICARE | Attending: Foot & Ankle Surgery | Primary: Family

## 2022-06-19 DIAGNOSIS — B351 Tinea unguium: Secondary | ICD-10-CM

## 2022-06-19 NOTE — Progress Notes (Signed)
SUBJECTIVE: Marissa Sawyer is a 72 y.o. female who returns to the office with chief complaint of painful fungal toenails. Patient relates toe nails are thickened/difficult to trim as well as painful with ambulation and with shoe gear.   Chief Complaint   Patient presents with    Nail Problem     B/l nail trim/ last seen Doran Durand 03/10/22    Diabetes     Last A1c 6.4     Review of Systems   Constitutional:  Negative for activity change, appetite change, chills, diaphoresis, fatigue and fever.   Respiratory:  Negative for shortness of breath.    Cardiovascular:  Negative for leg swelling.   Gastrointestinal:  Negative for diarrhea and nausea.   Endocrine: Negative for cold intolerance, heat intolerance and polyuria.   Musculoskeletal:  Positive for arthralgias. Negative for back pain, gait problem, joint swelling and myalgias.   Skin:  Negative for color change, pallor, rash and wound.   Allergic/Immunologic: Negative for environmental allergies and food allergies.   Neurological:  Negative for dizziness, weakness, light-headedness and numbness.   Hematological:  Does not bruise/bleed easily.   Psychiatric/Behavioral:  Negative for behavioral problems, confusion and self-injury. The patient is not nervous/anxious.      OBJECTIVE: Clinical evaluation of patient reveals nails 1,2,3,4,5 of the right foot and nails 1,2,3,4,5 of the left foot to present with thickness, elongation, discoloration, brittleness, and subungual debris. There was pain with palpation and debridement of the toenails of the bilateral feet. No open lesions noted to either foot today.   The right DP pulse is not palpable.   The left DP pulse is not palpable.   The right PT pulse is not palpable.   The left PT pulse is not palpable.   Protective sensation is present to the right plantar foot as noted with a 5.07 Semmes-Weinstein monofilament.   Protective sensation is present to the left plantar foot as noted with a 5.07 Semmes-Weinstein  monofilament.   HbA1c: 6.4 %.    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    Q7.      Class B Findings (2 needed)   1.  Absent posterior tibial pulse   2.  Absent dorsalis pedis pulse   3.  Advanced trophic changes; three of the following are required:             hair growth (decrease or absence)             nail changes (thickening)             pigmentary changes (discoloration)             skin texture (thin, shiny)             skin color (rubor or redness)    Q8.      Class C Findings (1 Class B, 2 Class C needed)   1.  Claudication   2.  Temperature changes   3.  Edema   4.  Paresthesia   5.  Burning    Q9.     ASSESSMENT:    Diagnosis Orders   1. Onychomycosis of toenail  PR DEBRIDEMENT NAIL ANY METHOD 6/>    [facility] DIABETES FOOT EXAM      2. Pain of toes of both feet  PR DEBRIDEMENT NAIL ANY METHOD 6/>    HM DIABETES FOOT EXAM      3. Type 2  diabetes mellitus with peripheral vascular disease (HCC)  PR DEBRIDEMENT NAIL ANY METHOD 6/>    HM DIABETES FOOT EXAM        PLAN: Toenails 1,2,3,4,5 of the right foot and 1,2,3,4,5 of the left foot were debrided in length and thickness using a nail nipper and a grinder. Return in about 9 weeks (around 08/21/2022) for At risk diabetic foot care.   06/19/2022      Ronette Deter, DPM

## 2022-06-23 ENCOUNTER — Ambulatory Visit: Admit: 2022-06-23 | Discharge: 2022-06-23 | Payer: MEDICARE | Attending: Family | Primary: Family

## 2022-06-23 ENCOUNTER — Inpatient Hospital Stay: Payer: MEDICARE | Primary: Family

## 2022-06-23 DIAGNOSIS — R252 Cramp and spasm: Secondary | ICD-10-CM

## 2022-06-23 DIAGNOSIS — R5383 Other fatigue: Secondary | ICD-10-CM

## 2022-06-23 LAB — POCT GLYCOSYLATED HEMOGLOBIN (HGB A1C): Hemoglobin A1C: 7 %

## 2022-06-23 MED ORDER — ONDANSETRON 4 MG PO TBDP
4 MG | ORAL_TABLET | Freq: Three times a day (TID) | ORAL | 0 refills | Status: DC | PRN
Start: 2022-06-23 — End: 2022-07-13

## 2022-06-23 NOTE — Progress Notes (Signed)
Patient c/o low back pain and feeling sluggish      Visit Information    Have you changed or started any medications since your last visit including any over-the-counter medicines, vitamins, or herbal medicines? no   Have you stopped taking any of your medications? Is so, why? -  no  Are you having any side effects from any of your medications? - no    Have you seen any other physician or provider since your last visit?  no   Have you had any other diagnostic tests since your last visit?  no   Have you been seen in the emergency room and/or had an admission in a hospital since we last saw you?  no   Have you had your routine dental cleaning in the past 6 months?  no     Do you have an active MyChart account? If no, what is the barrier?  Yes    Patient Care Team:  Doran Durand, APRN - CNP as PCP - General (Certified Nurse Practitioner)  Doran Durand, APRN - CNP as PCP - Empaneled Provider  Pangulur, Marguerita Beards, MD as Consulting Physician (Gastroenterology)    Medical History Review  Past Medical, Family, and Social History reviewed and does contribute to the patient presenting condition    Health Maintenance   Topic Date Due    DTaP/Tdap/Td vaccine (1 - Tdap) Never done    Diabetic retinal exam  06/14/2021    Annual Wellness Visit (Medicare Advantage)  03/06/2022    Shingles vaccine (1 of 2) 06/23/2023 (Originally 11/30/1999)    COVID-19 Vaccine (2 - 2023-24 season) 06/23/2023 (Originally 11/04/2021)    Respiratory Syncytial Virus (RSV) Pregnant or age 105 yrs+ (1 - 1-dose 60+ series) 06/23/2023 (Originally 11/29/2009)    A1C test (Diabetic or Prediabetic)  09/02/2022    Diabetic Alb to Cr ratio (uACR) test  10/04/2022    Flu vaccine (Season Ended) 10/05/2022    Depression Screen  03/11/2023    Lipids  03/14/2023    GFR test (Diabetes, CKD 3-4, OR last GFR 15-59)  03/14/2023    Diabetic foot exam  06/19/2023    Breast cancer screen  10/14/2023    Colorectal Cancer Screen  07/28/2025    DEXA (modify frequency per  FRAX score)  Completed    Pneumococcal 65+ years Vaccine  Completed    Hepatitis C screen  Completed    Hepatitis A vaccine  Aged Out    Hepatitis B vaccine  Aged Out    Hib vaccine  Aged Out    Polio vaccine  Aged Out    Meningococcal (ACWY) vaccine  Aged Out

## 2022-06-23 NOTE — Patient Instructions (Signed)
Thank you for choosing [facility].  We know you have options when it comes to your healthcare; we appreciate that you chose us. Our goal is to provide exceptional service and world class care to every patient.  You will be receiving a survey via email or text message asking for your feedback.  Please take a few minutes to share your thoughts about your recent visit. Your comments helps us understand what we do well and ways we can improve.    We thank you in advance for your valuable feedback  -Candace Jewell, CNP and Keiry Kowal, RMA
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
## 2022-06-24 LAB — MICROSCOPIC URINALYSIS
Casts UA: 2 /LPF (ref 0–8)
Epithelial Cells UA: 2 /HPF (ref 0–5)
WBC, UA: 2 /HPF (ref 0–5)

## 2022-06-24 LAB — URINALYSIS WITH REFLEX TO CULTURE
Bilirubin Urine: NEGATIVE
Glucose, Ur: NEGATIVE mg/dL
Ketones, Urine: NEGATIVE mg/dL
Nitrite, Urine: NEGATIVE
Protein, UA: NEGATIVE mg/dL
Specific Gravity, UA: 1.01 (ref 1.005–1.030)
Urine Hgb: NEGATIVE
Urobilinogen, Urine: NORMAL EU/dL (ref 0.0–1.0)
pH, UA: 5 (ref 5.0–8.0)

## 2022-06-24 LAB — MAGNESIUM: Magnesium: 2 mg/dL (ref 1.6–2.4)

## 2022-06-24 LAB — VITAMIN B12 & FOLATE
Folate: >20 ng/mL (ref 4.8–24.2)
Vitamin B-12: 347 pg/mL (ref 232–1245)

## 2022-06-24 LAB — TSH WITH REFLEX: TSH: 0.76 u[IU]/mL (ref 0.27–4.20)

## 2022-07-13 ENCOUNTER — Ambulatory Visit: Admit: 2022-07-13 | Discharge: 2022-07-13 | Payer: MEDICARE | Attending: Family Medicine | Primary: Family

## 2022-07-13 DIAGNOSIS — K529 Noninfective gastroenteritis and colitis, unspecified: Secondary | ICD-10-CM

## 2022-07-13 MED ORDER — LOPERAMIDE HCL 2 MG PO CAPS
2 | ORAL_CAPSULE | Freq: Four times a day (QID) | ORAL | 0 refills | Status: AC | PRN
Start: 2022-07-13 — End: 2022-07-23
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
## 2022-07-19 ENCOUNTER — Encounter

## 2022-07-19 MED ORDER — ATORVASTATIN CALCIUM 20 MG PO TABS
20 | ORAL_TABLET | ORAL | 3 refills | 30.00000 days | Status: DC
Start: 2022-07-19 — End: 2023-10-01

## 2022-07-19 MED ORDER — METFORMIN HCL ER 500 MG PO TB24
500 MG | ORAL_TABLET | ORAL | 1 refills | Status: AC
Start: 2022-07-19 — End: 2023-01-11

## 2022-07-19 NOTE — Telephone Encounter (Signed)
Last Visit:  10/03/2021     Next Visit Date:  Future Appointments   Date Time Provider Department Center   08/21/2022  1:15 PM Ferradino, Everett E, DPM [email or web address]   09/13/2022 11:00 AM Jewell, Candace, APRN - CNP [facility]       Health Maintenance   Topic Date Due    DTaP/Tdap/Td vaccine (1 - Tdap) Never done    Diabetic retinal exam  06/14/2021    Annual Wellness Visit (Medicare Advantage)  03/06/2022    Shingles vaccine (1 of 2) 06/23/2023 (Originally 11/30/1999)    COVID-19 Vaccine (2 - 2023-24 season) 06/23/2023 (Originally 11/04/2021)    Respiratory Syncytial Virus (RSV) Pregnant or age 60 yrs+ (1 - 1-dose 60+ series) 06/23/2023 (Originally 11/29/2009)    Diabetic Alb to Cr ratio (uACR) test  10/04/2022    Flu vaccine (Season Ended) 10/05/2022    Lipids  03/14/2023    GFR test (Diabetes, CKD 3-4, OR last GFR 15-59)  03/14/2023    Diabetic foot exam  06/19/2023    A1C test (Diabetic or Prediabetic)  06/23/2023    Depression Screen  06/23/2023    Breast cancer screen  10/14/2023    Colorectal Cancer Screen  07/28/2025    DEXA (modify frequency per FRAX score)  Completed    Pneumococcal 65+ years Vaccine  Completed    Hepatitis C screen  Completed    Hepatitis A vaccine  Aged Out    Hepatitis B vaccine  Aged Out    Hib vaccine  Aged Out    Polio vaccine  Aged Out    Meningococcal (ACWY) vaccine  Aged Out       Hemoglobin A1C (%)   Date Value   06/23/2022 7.0   09/01/2021 6.4 (H)   04/04/2021 6.6             ( goal A1C is < 7)   No components found for: "LABMICR"  No components found for: "LDLCHOLESTEROL", "LDLCALC"    (goal LDL is <100)   AST (U/L)   Date Value   03/13/2022 24     ALT (U/L)   Date Value   03/13/2022 12     BUN (mg/dL)   Date Value   03/13/2022 21     BP Readings from Last 3 Encounters:   07/13/22 138/78   06/23/22 136/80   03/10/22 120/72          (goal 120/80)    All Future Testing planned in [facility]  Lab Frequency Next Occurrence   H. Pylori Antigen, EIA Once 08/30/2021   Lipid  Panel Once 03/11/2022   CBC with Auto Differential Once 03/11/2022   Comprehensive Metabolic Panel Once 03/11/2022   Vitamin D 25 Hydroxy Once 03/11/2022               Patient Active Problem List:     Hyperlipidemia     HTN (hypertension)     OA (osteoarthritis)     Anxiety     Tubular adenoma     Dermatophytosis of nail     Benign neoplasm of skin     Diverticulosis of colon     Chronic GERD     History of gastric polyp     Controlled type 2 diabetes mellitus without complication, without long-term current use of insulin (HCC)

## 2022-07-19 NOTE — Telephone Encounter (Signed)
Last Visit:  10/03/2021     Next Visit Date:  Future Appointments   Date Time Provider Department Center   08/21/2022  1:15 PM Ronette Deter, DPM [facility]   09/13/2022 11:00 AM Doran Durand, APRN - CNP [place]       Health Maintenance   Topic Date Due    DTaP/Tdap/Td vaccine (1 - Tdap) Never done    Diabetic retinal exam  06/14/2021    Annual Wellness Visit (Medicare Advantage)  03/06/2022    Shingles vaccine (1 of 2) 06/23/2023 (Originally 11/30/1999)    COVID-19 Vaccine (2 - 2023-24 season) 06/23/2023 (Originally 11/04/2021)    Respiratory Syncytial Virus (RSV) Pregnant or age 65 yrs+ (1 - 1-dose 60+ series) 06/23/2023 (Originally 11/29/2009)    Diabetic Alb to Cr ratio (uACR) test  10/04/2022    Flu vaccine (Season Ended) 10/05/2022    Lipids  03/14/2023    GFR test (Diabetes, CKD 3-4, OR last GFR 15-59)  03/14/2023    Diabetic foot exam  06/19/2023    A1C test (Diabetic or Prediabetic)  06/23/2023    Depression Screen  06/23/2023    Breast cancer screen  10/14/2023    Colorectal Cancer Screen  07/28/2025    DEXA (modify frequency per FRAX score)  Completed    Pneumococcal 65+ years Vaccine  Completed    Hepatitis C screen  Completed    Hepatitis A vaccine  Aged Out    Hepatitis B vaccine  Aged Out    Hib vaccine  Aged Out    Polio vaccine  Aged Out    Meningococcal (ACWY) vaccine  Aged Out       Hemoglobin A1C (%)   Date Value   06/23/2022 7.0   09/01/2021 6.4 (H)   04/04/2021 6.6             ( goal A1C is < 7)   No components found for: "LABMICR"  No components found for: "LDLCHOLESTEROL", "LDLCALC"    (goal LDL is <100)   AST (U/L)   Date Value   03/13/2022 24     ALT (U/L)   Date Value   03/13/2022 12     BUN (mg/dL)   Date Value   16/12/9602 21     BP Readings from Last 3 Encounters:   07/13/22 138/78   06/23/22 136/80   03/10/22 120/72          (goal 120/80)    All Future Testing planned in [facility]  Lab Frequency Next Occurrence   H. Pylori Antigen, EIA Once 08/30/2021   Lipid  Panel Once 03/11/2022   CBC with Auto Differential Once 03/11/2022   Comprehensive Metabolic Panel Once 03/11/2022   Vitamin D 25 Hydroxy Once 03/11/2022               Patient Active Problem List:     Hyperlipidemia     HTN (hypertension)     OA (osteoarthritis)     Anxiety     Tubular adenoma     Dermatophytosis of nail     Benign neoplasm of skin     Diverticulosis of colon     Chronic GERD     History of gastric polyp     Controlled type 2 diabetes mellitus without complication, without long-term current use of insulin (HCC)

## 2022-08-21 ENCOUNTER — Encounter: Payer: MEDICARE | Attending: Foot & Ankle Surgery | Primary: Family

## 2022-09-13 ENCOUNTER — Encounter: Payer: MEDICARE | Attending: Family | Primary: Family

## 2022-09-18 ENCOUNTER — Ambulatory Visit: Admit: 2022-09-18 | Discharge: 2022-09-18 | Payer: MEDICARE | Attending: Family | Primary: Family

## 2022-09-18 DIAGNOSIS — Z0000 Encounter for general adult medical examination without abnormal findings: Secondary | ICD-10-CM

## 2022-09-18 NOTE — Patient Instructions (Addendum)
Thank you for choosing [facility].  We know you have options when it comes to your healthcare; we appreciate that you chose [facility]. Our goal is to provide exceptional service and world class care to every patient.  You will be receiving a survey via email or text message asking for your feedback.  Please take a few minutes to share your thoughts about your recent visit. Your comments helps [facility] understand what we do well and ways we can improve.    We thank you in advance for your valuable feedback  -Marissa Durand, CNP and [facility], [place]         Learning About Mild Cognitive Impairment (MCI)  What is mild cognitive impairment (MCI)?     It's common to forget things sometimes as we get older. But some older people have memory loss that's more than normal aging. It's called mild cognitive impairment, or MCI. It is not the same as dementia.  People with the condition often know that their memory or mental function has changed. Tests may show some loss. But their minds work well overall. They can carry out daily tasks that are normal for them.  People with MCI have a higher chance of one day getting dementia. But not all people who have it will get dementia. Some people may stay the same over time.  What are the symptoms?  People with MCI have more memory loss than what occurs with normal aging. They may have increasing trouble with recalling words and keeping up with conversations. They may also have trouble remembering important events and making decisions.  What puts you at risk?  The risk of getting MCI increases with age. Having high blood pressure or having a family history of MCI may also increase your risk.  How is it diagnosed?  Your doctor will do a physical exam.  You may be asked questions to check your memory and other mental skills. Your doctor may also talk to close friends and family members. This can help the doctor figure out how your memory and other mental skills have changed.  You may get blood tests and  tests that look at your brain.  These questions and tests can make sure you don't have other conditions that can cause symptoms like MCI. These include depression, sleep problems, and side effects from medicines.  How is it treated?  There are no medicines to treat MCI or to keep it from progressing to dementia. But treating conditions like high blood pressure and diabetes may help. A person with MCI needs routine follow-up visits with their doctor to check on changes in the person's mental skills.  How can you care for yourself at home?  Keeping your body active can help slow MCI. Exercises like walking can help. Try to stay active mentally too. Read or do things like crossword puzzles if you enjoy doing them.  If you need help coping with MCI, you may want to get support from family, friends, a support group, or a counselor who works with people who have MCI.  Though the future isn't always clear, it can be good to plan ahead with instructions for your care. These are called advanced directives. Having a plan can help make sure that you get the care you want.  Current as of: February 22, 2022  Content Version: 14.1   2006-2024 [employer], Incorporated.   Care instructions adapted under license by [facility]. If you have questions about a medical condition or this instruction, always  ask your healthcare professional. Healthwise, Incorporated disclaims any warranty or liability for your use of this information.           Learning About Stress  What is stress?     Stress is your body's response to a hard situation. Your body can have a physical, emotional, or mental response. Stress is a fact of life for most people, and it affects everyone differently. What causes stress for you may not be stressful for someone else.  A lot of things can cause stress. You may feel stress when you go on a job interview, take a test, or run a race. This kind of short-term stress is normal and even useful. It can help you if you  need to work hard or react quickly. For example, stress can help you finish an important job on time.  Long-term stress is caused by ongoing stressful situations or events. Examples of long-term stress include long-term health problems, ongoing problems at work, or conflicts in your family. Long-term stress can harm your health.  How does stress affect your health?  When you are stressed, your body responds as though you are in danger. It makes hormones that speed up your heart, make you breathe faster, and give you a burst of energy. This is called the fight-or-flight stress response. If the stress is over quickly, your body goes back to normal and no harm is done.  But if stress happens too often or lasts too long, it can have bad effects. Long-term stress can make you more likely to get sick, and it can make symptoms of some diseases worse. If you tense up when you are stressed, you may develop neck, shoulder, or low back pain. Stress is linked to high blood pressure and heart disease.  Stress also harms your emotional health. It can make you moody, tense, or depressed. Your relationships may suffer, and you may not do well at work or school.  What can you do to manage stress?  You can try these things to help manage stress:   Do something active. Exercise or activity can help reduce stress. Walking is a great way to get started. Even everyday activities such as housecleaning or yard work can help.  Try yoga or tai chi. These techniques combine exercise and meditation. You may need some training at first to learn them.  Do something you enjoy. For example, listen to music or go to a movie. Practice your hobby or do volunteer work.  Meditate. This can help you relax, because you are not worrying about what happened before or what may happen in the future.  Do guided imagery. Imagine yourself in any setting that helps you feel calm. You can use online videos, books, or a teacher to guide you.  Do breathing  exercises. For example:  From a standing position, bend forward from the waist with your knees slightly bent. Let your arms dangle close to the floor.  Breathe in slowly and deeply as you return to a standing position. Roll up slowly and lift your head last.  Hold your breath for just a few seconds in the standing position.  Breathe out slowly and bend forward from the waist.  Let your feelings out. Talk, laugh, cry, and express anger when you need to. Talking with supportive friends or family, a Veterinary surgeon, or a faith leader about your feelings is a healthy way to relieve stress. Avoid discussing your feelings with people who make you feel worse.  Write.  It may help to write about things that are bothering you. This helps you find out how much stress you feel and what is causing it. When you know this, you can find better ways to cope.  What can you do to prevent stress?  You might try some of these things to help prevent stress:  Manage your time. This helps you find time to do the things you want and need to do.  Get enough sleep. Your body recovers from the stresses of the day while you are sleeping.  Get support. Your family, friends, and community can make a difference in how you experience stress.  Limit your news feed. Avoid or limit time on social media or news that may make you feel stressed.  Do something active. Exercise or activity can help reduce stress. Walking is a great way to get started.  Where can you learn more?  Go to [employer] and enter N032 to learn more about "Learning About Stress."  Current as of: December 27, 2021  Content Version: 14.1   2006-2024 [employer], Incorporated.   Care instructions adapted under license by [facility]. If you have questions about a medical condition or this instruction, always ask your healthcare professional. [employer], Incorporated disclaims any warranty or liability for your use of this information.           Learning About Being  Active as an Older Adult  Why is being active important as you get older?     Being active is one of the best things you can do for your health. And it's never too late to start. Being active--or getting active, if you aren't already--has definite benefits. It can:  Give you more energy,  Keep your mind sharp.  Improve balance to reduce your risk of falls.  Help you manage chronic illness with fewer medicines.  No matter how old you are, how fit you are, or what health problems you have, there is a form of activity that will work for you. And the more physical activity you can do, the better your overall health will be.  What kinds of activity can help you stay healthy?  Being more active will make your daily activities easier. Physical activity includes planned exercise and things you do in daily life. There are four types of activity:  Aerobic.  Doing aerobic activity makes your heart and lungs strong.  Includes walking, dancing, and gardening.  Aim for at least 2 hours spread throughout the week.  It improves your energy and can help you sleep better.  Muscle-strengthening.  This type of activity can help maintain muscle and strengthen bones.  Includes climbing stairs, using resistance bands, and lifting or carrying heavy loads.  Aim for at least twice a week.  It can help protect the knees and other joints.  Stretching.  Stretching gives you better range of motion in joints and muscles.  Includes upper arm stretches, calf stretches, and gentle yoga.  Aim for at least twice a week, preferably after your muscles are warmed up from other activities.  It can help you function better in daily life.  Balancing.  This helps you stay coordinated and have good posture.  Includes heel-to-toe walking, tai chi, and certain types of yoga.  Aim for at least 3 days a week.  It can reduce your risk of falling.  Even if you have a hard time meeting the recommendations, it's better to be more active than less active. All  activity done  in each category counts toward your weekly total. You'd be surprised how daily things like carrying groceries, keeping up with grandchildren, and taking the stairs can add up.  What keeps you from being active?  If you've had a hard time being more active, you're not alone. Maybe you remember being able to do more. Or maybe you've never thought of yourself as being active. It's frustrating when you can't do the things you want. Being more active can help. What's holding you back?  Getting started.  Have a goal, but break it into easy tasks. Small steps build into big accomplishments.  Staying motivated.  If you feel like skipping your activity, remember your goal. Maybe you want to move better and stay independent. Every activity gets you one step closer.  Not feeling your best.  Start with 5 minutes of an activity you enjoy. Prove to yourself you can do it. As you get comfortable, increase your time.  You may not be where you want to be. But you're in the process of getting there. Everyone starts somewhere.  How can you find safe ways to stay active?  Talk with your doctor about any physical challenges you're facing. Make a plan with your doctor if you have a health problem or aren't sure how to get started with activity.  If you're already active, ask your doctor if there is anything you should change to stay safe as your body and health change.  If you tend to feel dizzy after you take medicine, avoid activity at that time. Try being active before you take your medicine. This will reduce your risk of falls.  If you plan to be active at home, make sure to clear your space before you get started. Remove things like TV cords, coffee tables, and throw rugs. It's safest to have plenty of space to move freely.  The key to getting more active is to take it slow and steady. Try to improve only a little bit at a time. Pick just one area to improve on at first. And if an activity hurts, stop and talk to your  doctor.  Where can you learn more?  Go to [employer] and enter P600 to learn more about "Learning About Being Active as an Older Adult."  Current as of: August 08, 2021  Content Version: 14.1   2006-2024 Healthwise, Incorporated.   Care instructions adapted under license by [facility]. If you have questions about a medical condition or this instruction, always ask your healthcare professional. Healthwise, Incorporated disclaims any warranty or liability for your use of this information.           Eating Healthy Foods: Care Instructions  With every meal, you can make healthy food choices. Try to eat a variety of fruits, vegetables, whole grains, lean proteins, and low-fat dairy products. This can help you get the right balance of nutrients, including vitamins and minerals. Small changes add up over time. You can start by adding one healthy food to your meals each day.    Try to make half your plate fruits and vegetables, one-fourth whole grains, and one-fourth lean proteins. Try including dairy with your meals.   Eat more fruits and vegetables. Try to have them with most meals and snacks.   Foods for healthy eating        Fruits   These can be fresh, frozen, canned, or dried.  Try to choose whole fruit rather than fruit juice.  Eat a variety of colors.  Vegetables   These can be fresh, frozen, canned, or dried.  Beans, peas, and lentils count too.        Whole grains   Choose whole-grain breads, cereals, and noodles.  Try brown rice.        Lean proteins   These can include lean meat, poultry, fish, and eggs.  You can also have tofu, beans, peas, lentils, nuts, and seeds.        Dairy   Try milk, yogurt, and cheese.  Choose low-fat or fat-free when you can.  If you need to, use lactose-free milk or fortified plant-based milk products, such as soy milk.        Water   Drink water when you're thirsty.  Limit sugar-sweetened drinks, including soda, fruit drinks, and sports drinks.  Where  can you learn more?  Go to [employer] and enter T756 to learn more about "Eating Healthy Foods: Care Instructions."  Current as of: November 23, 2021  Content Version: 14.1   2006-2024 Healthwise, Incorporated.   Care instructions adapted under license by [facility]. If you have questions about a medical condition or this instruction, always ask your healthcare professional. Healthwise, Incorporated disclaims any warranty or liability for your use of this information.           Advance Directives: Care Instructions  Overview  An advance directive is a legal way to state your wishes at the end of your life. It tells your family and your doctor what to do if you can't say what you want.  There are two main types of advance directives. You can change them any time your wishes change.  Living will.  This form tells your family and your doctor your wishes about life support and other treatment. The form is also called a declaration.  Medical power of attorney.  This form lets you name a person to make treatment decisions for you when you can't speak for yourself. This person is called a health care agent (health care proxy, health care surrogate). The form is also called a durable power of attorney for health care.  If you do not have an advance directive, decisions about your medical care may be made by a family member, or by a doctor or a judge who doesn't know you.  It may help to think of an advance directive as a gift to the people who care for you. If you have one, they won't have to make tough decisions by themselves.  For more information, including forms for your state, see the [employer] website ([employer]cy).  Follow-up care is a key part of your treatment and safety. Be sure to make and go to all appointments, and call your doctor if you are having problems. It's also a good idea to know your test results and keep a list of the medicines you  take.  What should you include in an advance directive?  Many states have a unique advance directive form. (It may ask you to address specific issues.) Or you might use a universal form that's approved by many states.  If your form doesn't tell you what to address, it may be hard to know what to include in your advance directive. Use the questions below to help you get started.  Who do you want to make decisions about your medical care if you are not able to?  What life-support measures do you want if you have a serious illness that gets worse over time or can't  be cured?  What are you most afraid of that might happen? (Maybe you're afraid of having pain, losing your independence, or being kept alive by machines.)  Where would you prefer to die? (Your home? A hospital? A nursing home?)  Do you want to donate your organs when you die?  Do you want certain religious practices performed before you die?  When should you call for help?  Be sure to contact your doctor if you have any questions.  Where can you learn more?  Go to [employer] and enter R264 to learn more about "Advance Directives: Care Instructions."  Current as of: January 19, 2022  Content Version: 14.1   2006-2024 Healthwise, Incorporated.   Care instructions adapted under license by [facility]. If you have questions about a medical condition or this instruction, always ask your healthcare professional. Healthwise, Incorporated disclaims any warranty or liability for your use of this information.           A Healthy Heart: Care Instructions  Overview     Coronary artery disease, also called heart disease, occurs when a substance called plaque builds up in the vessels that supply oxygen-rich blood to your heart muscle. This can narrow the blood vessels and reduce blood flow. A heart attack happens when blood flow is completely blocked. A high-fat diet, smoking, and other factors increase the risk of heart disease.  Your doctor  has found that you have a chance of having heart disease. A heart-healthy lifestyle can help keep your heart healthy and prevent heart disease. This lifestyle includes eating healthy, being active, staying at a weight that's healthy for you, and not smoking or using tobacco. It also includes taking medicines as directed, managing other health conditions, and trying to get a healthy amount of sleep.  Follow-up care is a key part of your treatment and safety. Be sure to make and go to all appointments, and call your doctor if you are having problems. It's also a good idea to know your test results and keep a list of the medicines you take.  How can you care for yourself at home?  Diet   Use less salt when you cook and eat. This helps lower your blood pressure. Taste food before salting. Add only a little salt when you think you need it. With time, your taste buds will adjust to less salt.    Eat fewer snack items, fast foods, canned soups, and other high-salt, high-fat, processed foods.    Read food labels and try to avoid saturated and trans fats. They increase your risk of heart disease by raising cholesterol levels.    Limit the amount of solid fat--butter, margarine, and shortening--you eat. Use olive, peanut, or canola oil when you cook. Bake, broil, and steam foods instead of frying them.    Eat a variety of fruit and vegetables every day. Dark green, deep orange, red, or yellow fruits and vegetables are especially good for you. Examples include spinach, carrots, peaches, and berries.    Foods high in fiber can reduce your cholesterol and provide important vitamins and minerals. High-fiber foods include whole-grain cereals and breads, oatmeal, beans, brown rice, citrus fruits, and apples.    Eat lean proteins. Heart-healthy proteins include seafood, lean meats and poultry, eggs, beans, peas, nuts, seeds, and soy products.    Limit drinks and foods with added sugar. These include candy, desserts, and soda  pop.   Heart-healthy lifestyle   If your doctor recommends it, get more  exercise. For many people, walking is a good choice. Or you may want to swim, bike, or do other activities. Bit by bit, increase the time you're active every day. Try for at least 30 minutes on most days of the week.    Try to quit or cut back on using tobacco and other nicotine products. This includes smoking and vaping. If you need help quitting, talk to your doctor about stop-smoking programs and medicines. These can increase your chances of quitting for good. Quitting is one of the most important things you can do to protect your heart. It is never too late to quit. Try to avoid secondhand smoke too.    Stay at a weight that's healthy for you. Talk to your doctor if you need help losing weight.    Try to get 7 to 9 hours of sleep each night.    Limit alcohol to 2 drinks a day for men and 1 drink a day for women. Too much alcohol can cause health problems.    Manage other health problems such as diabetes, high blood pressure, and high cholesterol. If you think you may have a problem with alcohol or drug use, talk to your doctor.   Medicines   Take your medicines exactly as prescribed. Call your doctor if you think you are having a problem with your medicine.    If your doctor recommends aspirin, take the amount directed each day. Make sure you take aspirin and not another kind of pain reliever, such as acetaminophen (Tylenol).   When should you call for help?   Call 911 if you have symptoms of a heart attack. These may include:   Chest pain or pressure, or a strange feeling in the chest.    Sweating.    Shortness of breath.    Pain, pressure, or a strange feeling in the back, neck, jaw, or upper belly or in one or both shoulders or arms.    Lightheadedness or sudden weakness.    A fast or irregular heartbeat.   After you call 911, the operator may tell you to chew 1 adult-strength or 2 to 4 low-dose aspirin. Wait for an  ambulance. Do not try to drive yourself.  Watch closely for changes in your health, and be sure to contact your doctor if you have any problems.  Where can you learn more?  Go to [employer] and enter F075 to learn more about "A Healthy Heart: Care Instructions."  Current as of: August 27, 2021  Content Version: 14.1   2006-2024 Healthwise, Incorporated.   Care instructions adapted under license by [facility]. If you have questions about a medical condition or this instruction, always ask your healthcare professional. Healthwise, Incorporated disclaims any warranty or liability for your use of this information.      Personalized Preventive Plan for [facility] - 09/18/2022  Medicare offers a range of preventive health benefits. Some of the tests and screenings are paid in full while other may be subject to a deductible, co-insurance, and/or copay.    Some of these benefits include a comprehensive review of your medical history including lifestyle, illnesses that may run in your family, and various assessments and screenings as appropriate.    After reviewing your medical record and screening and assessments performed today your provider may have ordered immunizations, labs, imaging, and/or referrals for you.  A list of these orders (if applicable) as well as your Preventive Care list are included within your After Visit  Summary for your review.    Other Preventive Recommendations:    A preventive eye exam performed by an eye specialist is recommended every 1-2 years to screen for glaucoma; cataracts, macular degeneration, and other eye disorders.  A preventive dental visit is recommended every 6 months.  Try to get at least 150 minutes of exercise per week or 10,000 steps per day on a pedometer .  Order or download the FREE "Exercise & Physical Activity: Your Everyday Guide" from [employer]. Call [phone] or search [employer] online.  You need  1200-1500 mg of calcium and 1000-2000 IU of vitamin D per day. It is possible to meet your calcium requirement with diet alone, but a vitamin D supplement is usually necessary to meet this goal.  When exposed to the sun, use a sunscreen that protects against both UVA and UVB radiation with an SPF of 30 or greater. Reapply every 2 to 3 hours or after sweating, drying off with a towel, or swimming.  Always wear a seat belt when traveling in a car. Always wear a helmet when riding a bicycle or motorcycle.

## 2022-09-18 NOTE — Progress Notes (Signed)
Medicare Annual Wellness Visit    Marissa Sawyer is here for Medicare AWV    Assessment & Plan   Medicare annual wellness visit, subsequent  Controlled type 2 diabetes mellitus without complication, without long-term current use of insulin (HCC)  Primary hypertension    Recommendations for Preventive Services Due: see orders and patient instructions/AVS.  Recommended screening schedule for the next 5-10 years is provided to the patient in written form: see Patient Instructions/AVS.     Return in 6 months (on 03/21/2023) for for DM F/U.     Subjective   The following acute and/or chronic problems were also addressed today:        Marissa Sawyer is an 72 y.o. female who presents for follow up of diabetes. Current symptoms include: none. Patient denies increase appetite, nausea, polydipsia, polyuria, visual disturbances, vomiting, and weight loss. Evaluation to date has included: hemoglobin A1C. Home sugars: patient does not check sugars. Current treatments: no recent interventions. In office A1c of 6.7    .Patient here for follow-up of elevated blood pressure.  She is not exercising and is adherent to a low-salt diet.  Blood pressure is well controlled at home. Cardiac symptoms: none. Patient denies: chest pain, chest pressure/discomfort, dyspnea, and lower extremity edema. Cardiovascular risk factors: advanced age (older than 29 for men, 58 for women), dyslipidemia, and hypertension. Use of agents associated with hypertension: none. History of target organ damage: none.    Patient's complete Health Risk Assessment and screening values have been reviewed and are found in Flowsheets. The following problems were reviewed today and where indicated follow up appointments were made and/or referrals ordered.    Positive Risk Factor Screenings with Interventions:       Cognitive:   Clock Drawing Test (CDT): (!) Abnormal  Words recalled: 3 Words Recalled  Total Score: 3  Total Score Interpretation: Normal  Mini-Cog  Interventions:  See AVS for additional education material           General HRA Questions:  Select all that apply: (!) Stress    Interventions - Stress:  See AVS for additional education material      Inactivity:  On average, how many days per week do you engage in moderate to strenuous exercise (like a brisk walk)?: 1 day (!) Abnormal  On average, how many minutes do you engage in exercise at this level?: 10 min  Interventions - Inactivity:  See AVS for additional education material    Poor Eating Habits/Diet:  Do you eat balanced/healthy meals regularly?: (!) No  Interventions:  See AVS for additional education material        Safety:  Do you have non-slip mats or non-slip surfaces or shower bars or grab bars in your shower or bathtub?: (!) No  Interventions:  Patient declined any further interventions or treatment     Advanced Directives:  Do you have a Living Will?: (!) No    Intervention:  has NO advanced directive - information provided                   Objective   Vitals:    09/18/22 1031   BP: 130/70   Site: Left Upper Arm   Position: Sitting   Cuff Size: Medium Adult   Pulse: 58   Resp: 19   SpO2: 100%   Weight: 69.4 kg (153 lb)   Height: 1.626 m (5\' 4" )      Body mass index is 26.26 kg/m.  General Appearance: alert and oriented to person, place and time, well-developed and well-nourished, in no acute distress  Pulmonary/Chest: clear to auscultation bilaterally- no wheezes, rales or rhonchi, normal air movement, no respiratory distress  Cardiovascular: normal rate, normal S1 and S2, no gallops, intact distal pulses, and no carotid bruits  Extremities: no cyanosis and no clubbing  Musculoskeletal: normal range of motion, no joint swelling, deformity or tenderness  Neurologic: gait and coordination normal and speech normal          No Known Allergies  Prior to Visit Medications    Medication Sig Taking? Authorizing Provider   metFORMIN (GLUCOPHAGE-XR) 500 MG extended release tablet TAKE 1  TABLET BY MOUTH DAILY Yes Roderic Scarce, Luiz Trumpower, APRN - CNP   atorvastatin (LIPITOR) 20 MG tablet Take 1 tablet by mouth every other day Yes Roderic Scarce, Lacee Grey, APRN - CNP   lisinopril (PRINIVIL;ZESTRIL) 10 MG tablet Take 1 tablet by mouth daily Yes Roderic Scarce, Avonelle Viveros, APRN - CNP   busPIRone (BUSPAR) 10 MG tablet Take 1 tablet by mouth 3 times daily as needed (anxiety) Yes Doran Durand, APRN - CNP   tiZANidine (ZANAFLEX) 2 MG tablet Take 1 tablet by mouth every 8 hours as needed (muscle spasms) Yes Vida Roller, MD   meloxicam (MOBIC) 15 MG tablet Take 1 tablet by mouth daily as needed for Pain Yes Vida Roller, MD   [facility] ULTRA strip use 1 TEST STRIP to TEST BLOOD SUGAR once daily Yes Cato Mulligan, APRN - CNP   triamcinolone (KENALOG) 0.025 % cream Apply topically 2 times daily.  Patient not taking: Reported on 09/18/2022  Cato Mulligan, APRN - CNP   Lancets MISC Test blood sugar once daily.  Vida Roller, MD       CareTeam (Including outside providers/suppliers regularly involved in providing care):   Patient Care Team:  Doran Durand, APRN - CNP as PCP - General (Certified Nurse Practitioner)  Doran Durand, APRN - CNP as PCP - Empaneled Provider  Pangulur, Marguerita Beards, MD as Consulting Physician (Gastroenterology)      Reviewed and updated this visit:  Tobacco  Allergies  Meds  Problems  Med Hx  Surg Hx  Soc Hx  Fam Hx

## 2022-09-18 NOTE — Progress Notes (Signed)
Awv      Visit Information    Have you changed or started any medications since your last visit including any over-the-counter medicines, vitamins, or herbal medicines? no   Have you stopped taking any of your medications? Is so, why? -  no  Are you having any side effects from any of your medications? - no    Have you seen any other physician or provider since your last visit?  no   Have you had any other diagnostic tests since your last visit?  no   Have you been seen in the emergency room and/or had an admission in a hospital since we last saw you?  no   Have you had your routine dental cleaning in the past 6 months?  no     Do you have an active MyChart account? If no, what is the barrier?  Yes    Patient Care Team:  Doran Durand, APRN - CNP as PCP - General (Certified Nurse Practitioner)  Doran Durand, APRN - CNP as PCP - Empaneled Provider  Pangulur, Marguerita Beards, MD as Consulting Physician (Gastroenterology)    Medical History Review  Past Medical, Family, and Social History reviewed and does contribute to the patient presenting condition    Health Maintenance   Topic Date Due    DTaP/Tdap/Td vaccine (1 - Tdap) Never done    Diabetic retinal exam  06/14/2021    Annual Wellness Visit (Medicare Advantage)  03/06/2022    Diabetic Alb to Cr ratio (uACR) test  10/04/2022    Shingles vaccine (1 of 2) 06/23/2023 (Originally 11/30/1999)    COVID-19 Vaccine (2 - 2023-24 season) 06/23/2023 (Originally 11/04/2021)    Respiratory Syncytial Virus (RSV) Pregnant or age 69 yrs+ (1 - 1-dose 60+ series) 06/23/2023 (Originally 11/29/2009)    Flu vaccine (1) 10/05/2022    Lipids  03/14/2023    GFR test (Diabetes, CKD 3-4, OR last GFR 15-59)  03/14/2023    Diabetic foot exam  06/19/2023    A1C test (Diabetic or Prediabetic)  06/23/2023    Depression Screen  06/23/2023    Breast cancer screen  10/14/2023    Colorectal Cancer Screen  07/28/2025    DEXA (modify frequency per FRAX score)  Completed    Pneumococcal 65+ years Vaccine   Completed    Hepatitis C screen  Completed    Hepatitis A vaccine  Aged Out    Hepatitis B vaccine  Aged Out    Hib vaccine  Aged Out    Polio vaccine  Aged Out    Meningococcal (ACWY) vaccine  Aged Out

## 2022-09-19 NOTE — Telephone Encounter (Signed)
Patient is requesting an order for a glucometer to be sent to [facility] in [place]

## 2022-09-20 ENCOUNTER — Encounter

## 2022-09-20 MED ORDER — GLUCOSE BLOOD VI STRP
ORAL_STRIP | 11 refills | Status: AC
Start: 2022-09-20 — End: ?

## 2022-09-20 MED ORDER — LANCING DEVICE MISC
0 refills | Status: AC
Start: 2022-09-20 — End: ?

## 2022-09-20 MED ORDER — LANCETS MISC
5 refills | Status: AC
Start: 2022-09-20 — End: 2023-03-21

## 2022-09-20 MED ORDER — ONETOUCH ULTRA 2 W/DEVICE KIT
PACK | 0 refills | Status: AC
Start: 2022-09-20 — End: ?

## 2022-09-20 MED ORDER — ALCOHOL PREP 70 % PADS
70 % | 11 refills | Status: AC
Start: 2022-09-20 — End: ?

## 2022-09-20 NOTE — Telephone Encounter (Signed)
Yes please

## 2022-09-20 NOTE — Telephone Encounter (Signed)
Does she need strips and lancets also? Last note says she doesn't check sugars.

## 2022-09-20 NOTE — Telephone Encounter (Signed)
Script sent  

## 2022-09-21 MED ORDER — LISINOPRIL 10 MG PO TABS
10 MG | ORAL_TABLET | Freq: Every day | ORAL | 1 refills | Status: DC
Start: 2022-09-21 — End: 2023-01-11

## 2022-09-21 NOTE — Telephone Encounter (Signed)
Last Visit:  09/18/2022     Next Visit Date:  Future Appointments   Date Time Provider Department Center   10/16/2022 10:00 AM [facility]   03/21/2023 10:30 AM Doran Durand, APRN - CNP [place] [facility]       Health Maintenance   Topic Date Due    DTaP/Tdap/Td vaccine (1 - Tdap) Never done    Diabetic retinal exam  06/14/2021    Diabetic Alb to Cr ratio (uACR) test  10/04/2022    Shingles vaccine (1 of 2) 06/23/2023 (Originally 11/30/1999)    COVID-19 Vaccine (2 - 2023-24 season) 06/23/2023 (Originally 11/04/2021)    Respiratory Syncytial Virus (RSV) Pregnant or age 1 yrs+ (1 - 1-dose 60+ series) 06/23/2023 (Originally 11/29/2009)    Flu vaccine (1) 10/05/2022    Lipids  03/14/2023    GFR test (Diabetes, CKD 3-4, OR last GFR 15-59)  03/14/2023    Diabetic foot exam  06/19/2023    A1C test (Diabetic or Prediabetic)  06/23/2023    Depression Screen  09/18/2023    Breast cancer screen  10/14/2023    Colorectal Cancer Screen  07/28/2025    DEXA (modify frequency per FRAX score)  Completed    Annual Wellness Visit (Medicare Advantage)  Completed    Pneumococcal 65+ years Vaccine  Completed    Hepatitis C screen  Completed    Hepatitis A vaccine  Aged Out    Hepatitis B vaccine  Aged Out    Hib vaccine  Aged Out    Polio vaccine  Aged Out    Meningococcal (ACWY) vaccine  Aged Out       Hemoglobin A1C (%)   Date Value   06/23/2022 7.0   09/01/2021 6.4 (H)   04/04/2021 6.6             ( goal A1C is < 7)   No components found for: "LABMICR"  No components found for: "LDLCHOLESTEROL", "LDLCALC"    (goal LDL is <100)   AST (U/L)   Date Value   03/13/2022 24     ALT (U/L)   Date Value   03/13/2022 12     BUN (mg/dL)   Date Value   16/12/9602 21     BP Readings from Last 3 Encounters:   09/18/22 130/70   07/13/22 138/78   06/23/22 136/80          (goal 120/80)    All Future Testing planned in [facility]  Lab Frequency Next Occurrence   Lipid Panel Once 03/11/2022   CBC with Auto Differential Once  03/11/2022   Comprehensive Metabolic Panel Once 03/11/2022   Vitamin D 25 Hydroxy Once 03/11/2022               Patient Active Problem List:     Hyperlipidemia     HTN (hypertension)     OA (osteoarthritis)     Anxiety     Tubular adenoma     Dermatophytosis of nail     Benign neoplasm of skin     Diverticulosis of colon     Chronic GERD     History of gastric polyp     Controlled type 2 diabetes mellitus without complication, without long-term current use of insulin (HCC)

## 2022-10-16 ENCOUNTER — Inpatient Hospital Stay: Admit: 2022-10-16 | Payer: MEDICARE | Primary: Family

## 2022-10-16 ENCOUNTER — Encounter

## 2022-10-16 DIAGNOSIS — Z1231 Encounter for screening mammogram for malignant neoplasm of breast: Secondary | ICD-10-CM

## 2022-11-12 ENCOUNTER — Encounter

## 2022-11-13 MED ORDER — TIZANIDINE HCL 2 MG PO TABS
2 MG | ORAL_TABLET | Freq: Three times a day (TID) | ORAL | 0 refills | Status: DC | PRN
Start: 2022-11-13 — End: 2023-07-17

## 2022-11-13 NOTE — Telephone Encounter (Signed)
Last seen 09/18/2022  Next appt 03/21/2023

## 2022-12-08 ENCOUNTER — Ambulatory Visit: Admit: 2022-12-08 | Discharge: 2022-12-08 | Payer: MEDICARE | Attending: Foot & Ankle Surgery | Primary: Family

## 2022-12-08 DIAGNOSIS — B351 Tinea unguium: Secondary | ICD-10-CM

## 2022-12-10 NOTE — Progress Notes (Signed)
SUBJECTIVE: Marissa Sawyer is a 72 y.o. female who returns to the office with chief complaint of painful fungal toenails. Patient relates toe nails are thickened/difficult to trim as well as painful with ambulation and with shoe gear.   Chief Complaint   Patient presents with    Nail Problem     Nail trim/lasts aw Candace Jewell CNP 09/18/22    Diabetes     Last A1c 7.0     Review of Systems   Constitutional:  Negative for activity change, appetite change, chills, diaphoresis, fatigue and fever.   Respiratory:  Negative for shortness of breath.    Cardiovascular:  Negative for leg swelling.   Gastrointestinal:  Negative for diarrhea and nausea.   Endocrine: Negative for cold intolerance, heat intolerance and polyuria.   Musculoskeletal:  Positive for arthralgias. Negative for back pain, gait problem, joint swelling and myalgias.   Skin:  Negative for color change, pallor, rash and wound.   Allergic/Immunologic: Negative for environmental allergies and food allergies.   Neurological:  Negative for dizziness, weakness, light-headedness and numbness.   Hematological:  Does not bruise/bleed easily.   Psychiatric/Behavioral:  Negative for behavioral problems, confusion and self-injury. The patient is not nervous/anxious.      OBJECTIVE: Clinical evaluation of patient reveals nails 1,2,3,4,5 of the right foot and nails 1,2,3,4,5 of the left foot to present with thickness, elongation, discoloration, brittleness, and subungual debris. There was pain with palpation and debridement of the toenails of the bilateral feet. No open lesions noted to either foot today.   The right DP pulse is not palpable.   The left DP pulse is not palpable.   The right PT pulse is not palpable.   The left PT pulse is not palpable.   Protective sensation is present to the right plantar foot as noted with a 5.07 Semmes-Weinstein monofilament.   Protective sensation is present to the left plantar foot as noted with a 5.07 Semmes-Weinstein monofilament.    HbA1c: 7.0 %.    Class A Findings (1 needed)   []  Non-traumatic amputation of foot or integral skeleton portion thereof.   []  Q7.      Class B Findings (2 needed)   1. [x]  Absent posterior tibial pulse   2. [x]  Absent dorsalis pedis pulse   3. []  Advanced trophic changes; three of the following are required:            []  hair growth (decrease or absence)            []  nail changes (thickening)            []  pigmentary changes (discoloration)            []  skin texture (thin, shiny)            []  skin color (rubor or redness)   [x]  Q8.      Class C Findings (1 Class B, 2 Class C needed)   1. []  Claudication   2. []  Temperature changes   3. []  Edema   4. []  Paresthesia   5. []  Burning   []  Q9.     ASSESSMENT:    Diagnosis Orders   1. Onychomycosis of toenail  DEBRIDEMENT OF NAILS, 6 OR MORE    HM DIABETES FOOT EXAM      2. Pain of toes of both feet  DEBRIDEMENT OF NAILS, 6 OR MORE    HM DIABETES FOOT EXAM      3. Type 2 diabetes  mellitus with peripheral vascular disease (HCC)  DEBRIDEMENT OF NAILS, 6 OR MORE    HM DIABETES FOOT EXAM        PLAN: Toenails 1,2,3,4,5 of the right foot and 1,2,3,4,5 of the left foot were debrided in length and thickness using a nail nipper and a grinder. Return in about 9 weeks (around 02/09/2023) for At risk diabetic foot care.   12/08/2022      Ronette Deter, DPM

## 2023-01-11 ENCOUNTER — Encounter

## 2023-01-11 MED ORDER — LISINOPRIL 10 MG PO TABS
10 MG | ORAL_TABLET | Freq: Every day | ORAL | 1 refills | Status: DC
Start: 2023-01-11 — End: 2023-07-10

## 2023-01-11 MED ORDER — METFORMIN HCL ER 500 MG PO TB24
500 MG | ORAL_TABLET | ORAL | 1 refills | Status: AC
Start: 2023-01-11 — End: 2023-03-21

## 2023-01-11 NOTE — Telephone Encounter (Signed)
Last Visit:  09/18/22    Next Visit Date:  Future Appointments   Date Time Provider Department Center   02/16/2023 12:00 PM Ronette Deter, DPM [place] [facility]   03/21/2023 10:30 AM Doran Durand, APRN - CNP [place] [facility]       Health Maintenance   Topic Date Due    DTaP/Tdap/Td vaccine (1 - Tdap) Never done    Diabetic retinal exam  06/14/2021    Diabetic Alb to Cr ratio (uACR) test  10/04/2022    Flu vaccine (1) Never done    COVID-19 Vaccine (2 - 2023-24 season) 11/05/2022    Shingles vaccine (1 of 2) 06/23/2023 (Originally 11/30/1999)    Respiratory Syncytial Virus (RSV) Pregnant or age 63 yrs+ (1 - 1-dose 60+ series) 06/23/2023 (Originally 11/29/2009)    Lipids  03/14/2023    GFR test (Diabetes, CKD 3-4, OR last GFR 15-59)  03/14/2023    A1C test (Diabetic or Prediabetic)  06/23/2023    Depression Screen  09/18/2023    Diabetic foot exam  12/10/2023    Breast cancer screen  10/15/2024    Colorectal Cancer Screen  07/28/2025    DEXA (modify frequency per FRAX score)  Completed    Annual Wellness Visit (Medicare Advantage)  Completed    Pneumococcal 65+ years Vaccine  Completed    Hepatitis C screen  Completed    Hepatitis A vaccine  Aged Out    Hepatitis B vaccine  Aged Out    Hib vaccine  Aged Out    Polio vaccine  Aged Out    Meningococcal (ACWY) vaccine  Aged Out       Hemoglobin A1C (%)   Date Value   06/23/2022 7.0   09/01/2021 6.4 (H)   04/04/2021 6.6             ( goal A1C is < 7)   No components found for: "LABMICR"  No components found for: "LDLCHOLESTEROL", "LDLCALC"    (goal LDL is <100)   AST (U/L)   Date Value   03/13/2022 24     ALT (U/L)   Date Value   03/13/2022 12     BUN (mg/dL)   Date Value   10/62/6948 21     BP Readings from Last 3 Encounters:   09/18/22 130/70   07/13/22 138/78   06/23/22 136/80          (goal 120/80)    All Future Testing planned in [facility]  Lab Frequency Next Occurrence   Lipid Panel Once 03/11/2022   CBC with Auto Differential Once 03/11/2022    Comprehensive Metabolic Panel Once 03/11/2022   Vitamin D 25 Hydroxy Once 03/11/2022               Patient Active Problem List:     Hyperlipidemia     HTN (hypertension)     OA (osteoarthritis)     Anxiety     Tubular adenoma     Dermatophytosis of nail     Benign neoplasm of skin     Diverticulosis of colon     Chronic GERD     History of gastric polyp     Controlled type 2 diabetes mellitus without complication, without long-term current use of insulin (HCC)

## 2023-01-11 NOTE — Telephone Encounter (Signed)
Next Visit Date:  Future Appointments   Date Time Provider Department Center   02/16/2023 12:00 PM Jeanmarie Hubert [facility]   03/21/2023 10:30 AM Doran Durand, APRN - CNP [place] [facility] DEP       Last Visit Date:  09/18/2022  Last Fill:  [id number]

## 2023-02-16 ENCOUNTER — Ambulatory Visit: Admit: 2023-02-16 | Discharge: 2023-02-16 | Payer: MEDICARE | Attending: Foot & Ankle Surgery | Primary: Family

## 2023-02-16 VITALS — Ht 64.0 in | Wt 155.0 lb

## 2023-02-16 DIAGNOSIS — B351 Tinea unguium: Secondary | ICD-10-CM

## 2023-02-18 NOTE — Progress Notes (Signed)
 SUBJECTIVE: Marissa Sawyer is a 72 y.o. female who returns to the office with chief complaint of painful fungal toenails. Patient relates toe nails are thickened/difficult to trim as well as painful with ambulation and with shoe gear.   Chief Complaint   Dennie Bible

## 2023-02-26 ENCOUNTER — Encounter: Payer: MEDICARE | Attending: Family | Primary: Family

## 2023-03-21 ENCOUNTER — Ambulatory Visit: Admit: 2023-03-21 | Discharge: 2023-03-21 | Payer: MEDICARE | Attending: Family | Primary: Family

## 2023-03-21 ENCOUNTER — Inpatient Hospital Stay: Payer: MEDICARE | Primary: Family

## 2023-03-21 VITALS — BP 120/60 | HR 55 | Ht 64.0 in | Wt 153.0 lb

## 2023-03-21 DIAGNOSIS — E119 Type 2 diabetes mellitus without complications: Secondary | ICD-10-CM

## 2023-03-21 LAB — LIPID PANEL
Chol/HDL Ratio: 3.6
Cholesterol, Total: 154 mg/dL (ref 0–199)
HDL: 43 mg/dL (ref >40)
LDL Cholesterol: 89 mg/dL (ref 0–100)
Triglycerides: 109 mg/dL (ref <150)
VLDL: 22 mg/dL (ref 1–30)

## 2023-03-21 LAB — CBC WITH AUTO DIFFERENTIAL
Basophils %: 0 % (ref 0–2)
Basophils Absolute: <0.03 10*3/uL (ref 0.00–0.20)
Eosinophils %: 7 % — ABNORMAL HIGH (ref 1–4)
Eosinophils Absolute: 0.46 10*3/uL — ABNORMAL HIGH (ref 0.00–0.44)
Hematocrit: 40.8 % (ref 36.3–47.1)
Hemoglobin: 12.9 g/dL (ref 11.9–15.1)
Immature Granulocytes %: 0 %
Immature Granulocytes Absolute: <0.03 10*3/uL (ref 0.00–0.30)
Lymphocytes %: 29 % (ref 24–43)
Lymphocytes Absolute: 1.82 10*3/uL (ref 1.10–3.70)
MCH: 28.9 pg (ref 25.2–33.5)
MCHC: 31.6 g/dL (ref 28.4–34.8)
MCV: 91.3 fL (ref 82.6–102.9)
MPV: 10.4 fL (ref 8.1–13.5)
Monocytes %: 10 % (ref 3–12)
Monocytes Absolute: 0.63 10*3/uL (ref 0.10–1.20)
NRBC Automated: 0 /100{WBCs}
Neutrophils %: 54 % (ref 36–65)
Neutrophils Absolute: 3.44 10*3/uL (ref 1.50–8.10)
Platelets: 219 10*3/uL (ref 138–453)
RBC: 4.47 m/uL (ref 3.95–5.11)
RDW: 13.2 % (ref 11.8–14.4)
WBC: 6.4 10*3/uL (ref 3.5–11.3)

## 2023-03-21 LAB — ALBUMIN/CREATININE RATIO, URINE
Albumin Urine: <12 mg/L (ref 0–20)
Creatinine, Ur: 135 mg/dL (ref 28.0–217.0)

## 2023-03-21 LAB — COMPREHENSIVE METABOLIC PANEL
ALT: 18 U/L (ref 10–35)
AST: 26 U/L (ref 10–35)
Albumin/Globulin Ratio: 1.3 (ref 1.0–2.5)
Albumin: 4.4 g/dL (ref 3.5–5.2)
Alkaline Phosphatase: 74 U/L (ref 35–104)
Anion Gap: 9 mmol/L (ref 9–16)
BUN: 23 mg/dL (ref 8–23)
CO2: 26 mmol/L (ref 20–31)
Calcium: 9.9 mg/dL (ref 8.6–10.4)
Chloride: 107 mmol/L (ref 98–107)
Creatinine: 1 mg/dL — ABNORMAL HIGH (ref 0.6–0.9)
Est, Glom Filt Rate: 59 mL/min/{1.73_m2} — ABNORMAL LOW (ref >60)
Glucose: 134 mg/dL — ABNORMAL HIGH (ref 74–99)
Potassium: 4.6 mmol/L (ref 3.7–5.3)
Sodium: 142 mmol/L (ref 136–145)
Total Bilirubin: 0.5 mg/dL (ref 0.0–1.2)
Total Protein: 7.9 g/dL (ref 6.6–8.7)

## 2023-03-21 LAB — POCT GLYCOSYLATED HEMOGLOBIN (HGB A1C): Hemoglobin A1C: 6.9 %

## 2023-03-21 MED ORDER — METFORMIN HCL ER 500 MG PO TB24
500 | ORAL_TABLET | ORAL | 1 refills | Status: DC
Start: 2023-03-21 — End: 2023-10-30

## 2023-03-21 NOTE — Telephone Encounter (Signed)
 Attempt 1 - LVM for GI appt  Attempt 2 - sent letter

## 2023-03-21 NOTE — Progress Notes (Signed)
Patient is here for a diabetes follow up. She is also experiencing random pain in upper right quadrant.
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
## 2023-03-27 IMAGING — MR JOELHO ESQUERDO
5 series · 38 of 40 positions shown · non-contrast
Comparison: none

[Series 2: localizar · axial · left · 6.0mm · 0.70mm/px · z∈[-38,+38]mm · 6 of 12 slices shown]
[im 1/12]
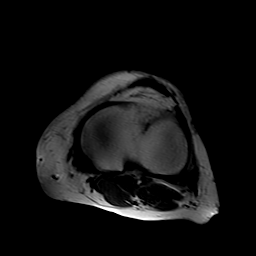
[im 3/12]
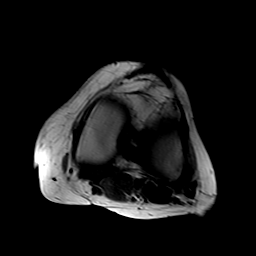
[im 5/12]
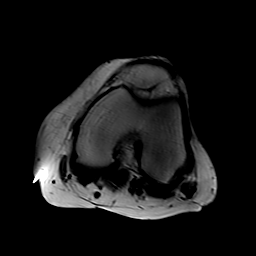
[im 7/12]
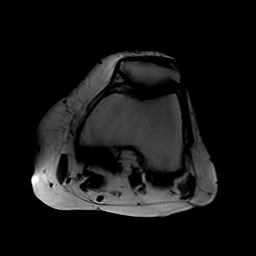
[im 9/12]
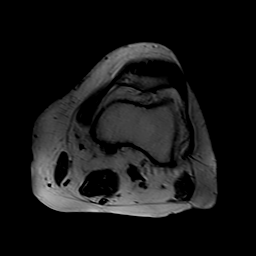
[im 12/12]
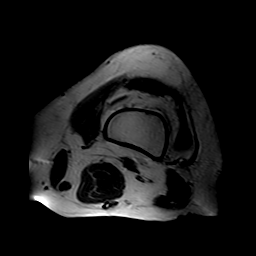

[Series 3: T1 · sagittal · left · 4.0mm · 0.70mm/px · 8 of 16 slices shown]
[im 1/16]
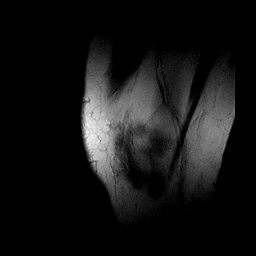
[im 3/16]
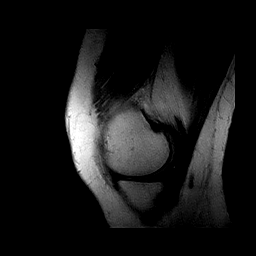
[im 5/16]
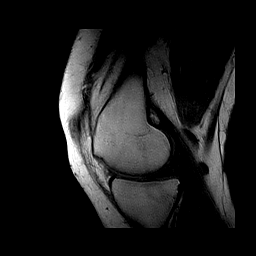
[im 7/16]
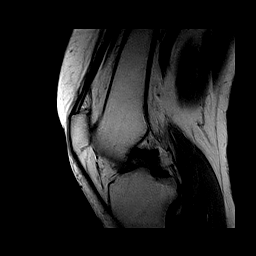
[im 9/16]
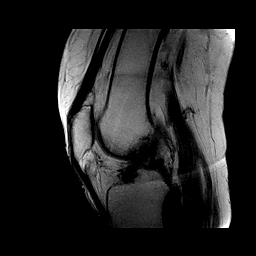
[im 11/16]
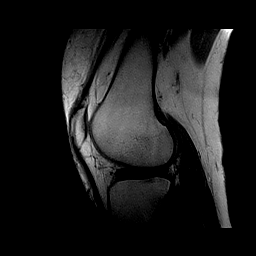
[im 13/16]
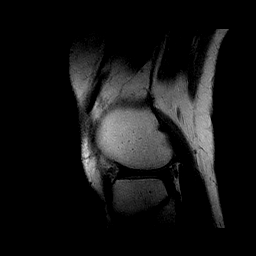
[im 16/16]
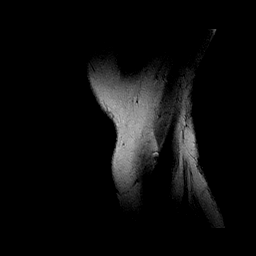

[Series 4: STIR · sagittal · left · 4.0mm · 0.70mm/px · 8 of 16 slices shown (1 of 3)]
[im 1/16]
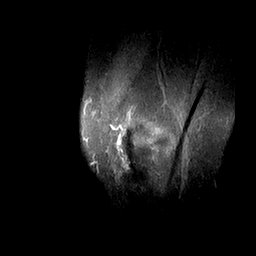
[im 3/16]
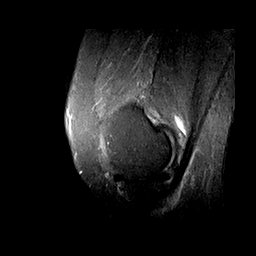
[im 5/16]
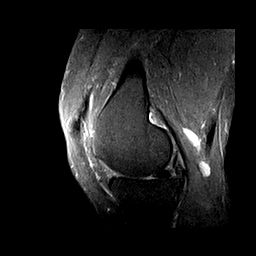
[im 7/16]
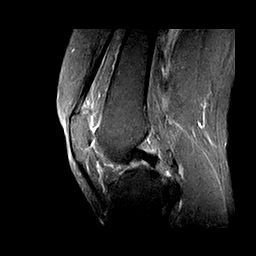
[im 9/16]
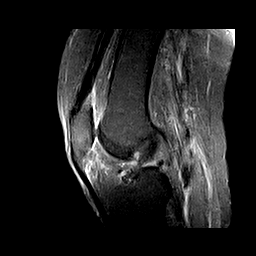
[im 11/16]
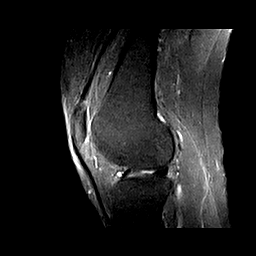
[im 13/16]
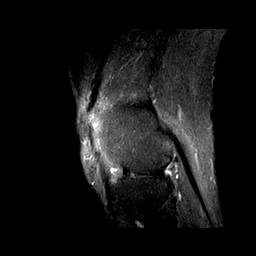
[im 16/16]
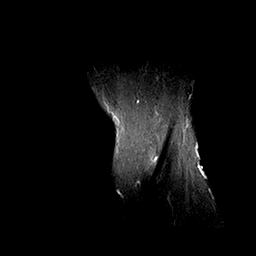

[Series 5: STIR · coronal · left · 4.0mm · 0.70mm/px · 8 of 16 slices shown (2 of 3)]
[im 1/16]
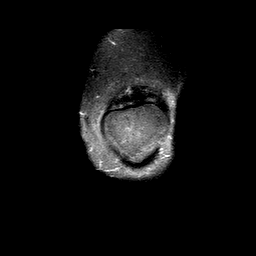
[im 3/16]
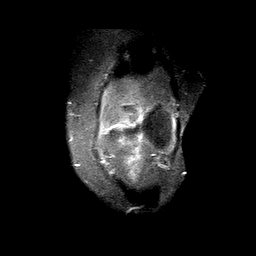
[im 5/16]
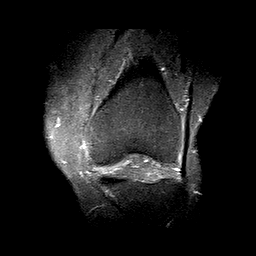
[im 7/16]
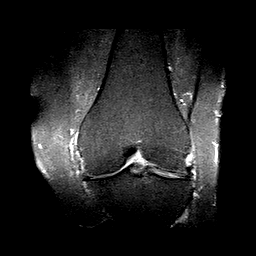
[im 9/16]
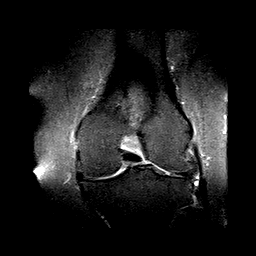
[im 11/16]
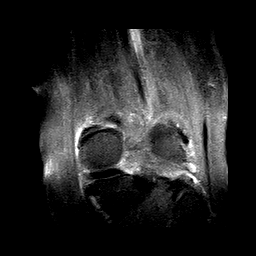
[im 13/16]
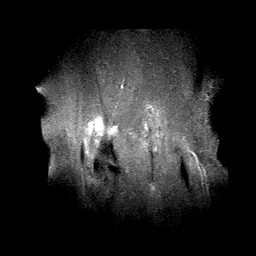
[im 16/16]
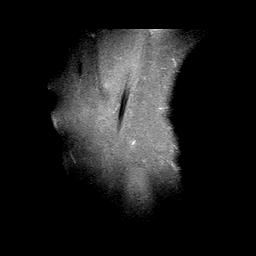

[Series 6: STIR · axial · left · 4.0mm · 0.39mm/px · z∈[-69,+35]mm · 8 of 20 slices shown (3 of 3)]
[im 1/20]
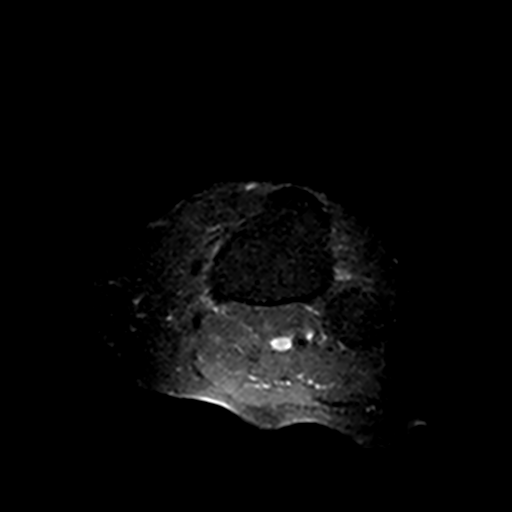
[im 3/20]
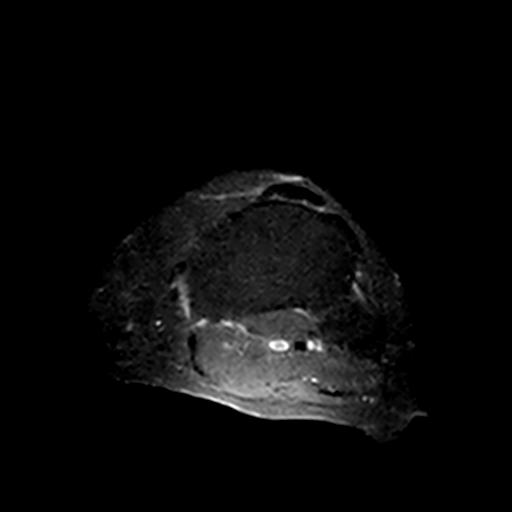
[im 7/20]
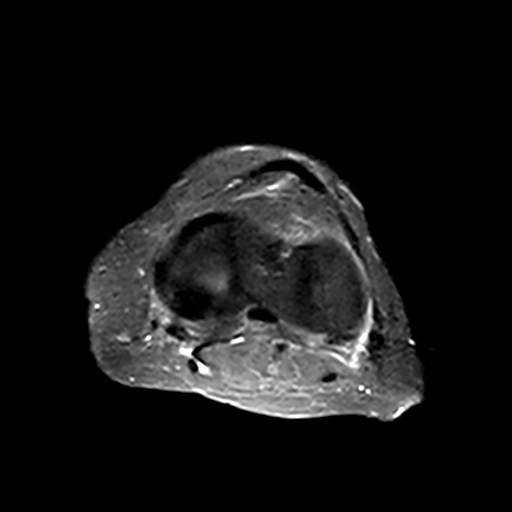
[im 9/20]
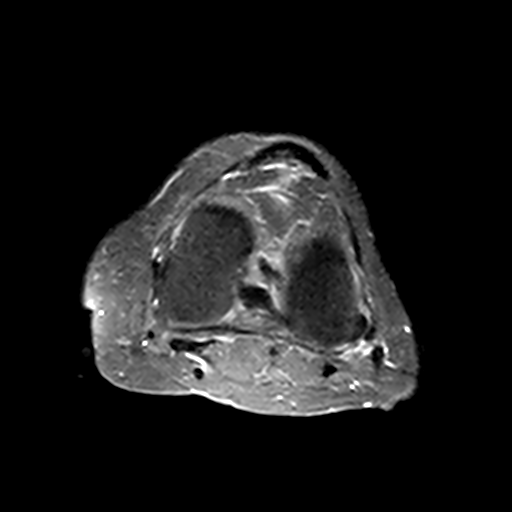
[im 11/20]
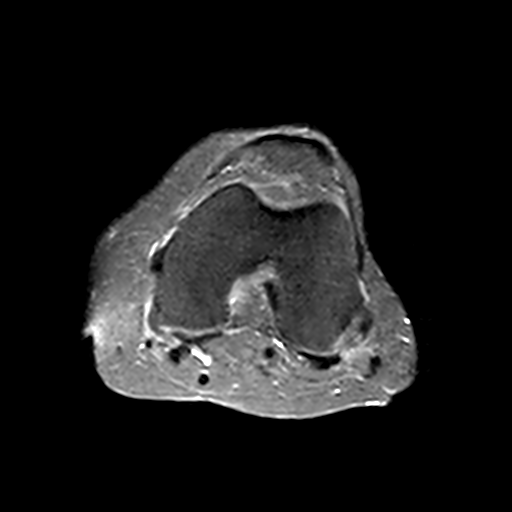
[im 13/20]
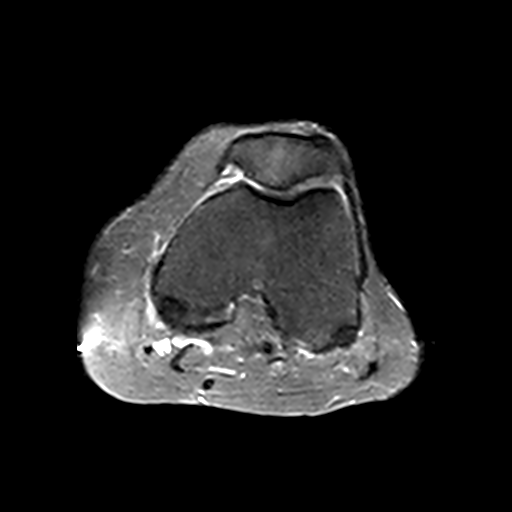
[im 17/20]
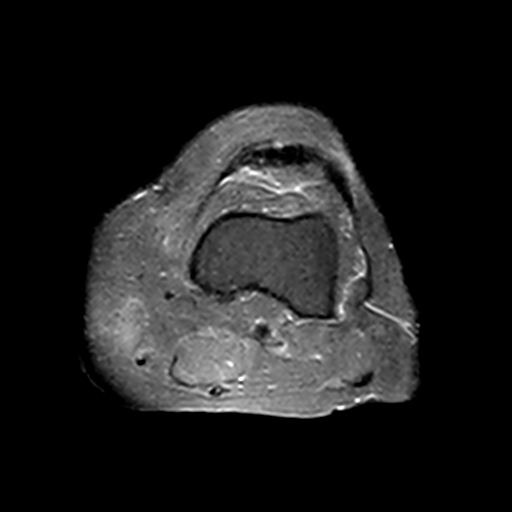
[im 20/20]
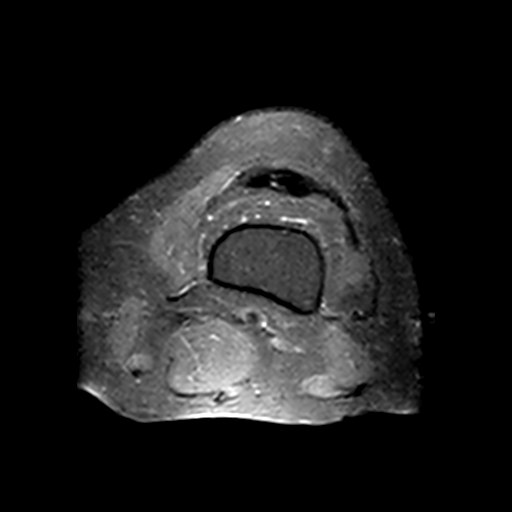

[38 of 40 positions shown; findings below may reference images not displayed]

RESSONÂNCIA MAGNÉTICA DO JOELHO ESQUERDO

TÉCNICA:

Exame realizado em equipamento de ressonância magnética com sequências, ponderações e planos específicos para o segmento de interesse, sem a administração endovenosa do meio de contraste.
RESULTADO:
Alterações degenerativas nos compartimentos femorotibiais caracterizadas por osteofitose marginal, heterogeneidade de sinal do revestimento condral e fissuras condrais atingindo as camadas médias e profundas na área de carga dos côndilos femorais, com focos tênues de edema ósseo subcondral, especialmente na margem posterossuperior do côndilo femoral lateral.
Alterações degenerativas leves no compartimento patelofemoral com osteofitose marginal, heterogeneidade de sinal do revestimento condral e fissuras condrais atingindo as camadas médias e profundas, determinando edema ósseo subcondral no terço médio superior do vértice e faceta patelar medial.
Menisco medial apresenta redução volumétrica irregular difusa, com amputação da margem livre do corpo, extrusão parcial para o recesso medial e sinal linear hiperintenso em praticamente toda sua extensão atingindo ambas superfícies meniscais, associado a edema perimeniscal.
Menisco lateral demonstra extensa amputação da margem livre do corpo e corno anterior, com extrusão parcial para o recesso lateral e sinal irregular hiperintenso em seu corno anterior atingindo a superfície meniscal inferior.
Ligamento colateral medial apresenta espessamento irregular das fibras medioproximais, com discreto edema periligamentar e abaulamento, secundários às alterações degenerativas do compartimento femorotibial medial. 
Ligamentos cruzados e colateral lateral íntegros.
Tendinose proximal do gastrocnêmio medial com delaminações intrasubstanciais e peritendinite.
Cisto poplíteo medindo 3,4 x 1,4 cm.
Distensão líquida da bursa da pata anserina.
Edema na topografia da gordura retroquadricipital.

CONCLUSÃO:

Complexa rotura degenerativa do menisco medial.
Extensa amputação da margem livre do corpo e corno anterior do menisco lateral com extrusão parcial e sinal irregular hiperintenso em seu corno anterior atingindo a superfície meniscal inferior.
Alterações degenerativas nos compartimentos femorotibiais com osteofitose marginal, heterogeneidade de sinal do revestimento condral, fissuras condrais nas camadas médias e profundas dos côndilos femorais e focos de edema ósseo subcondral no côndilo femoral lateral.
Alterações degenerativas no compartimento patelo troclear com osteofitose marginal, heterogeneidade de sinal do revestimento condral, fissuras condrais e edema ósseo subcondral no terço médio superior do vértice e faceta patelar medial.
Sequela de estiramento prévio do ligamento colateral medial.
Tendinose proximal do gastrocnêmio medial com delaminações intrassubstanciais e peritendinite.
Pequeno cisto poplíteo.
Bursite da pata anserina.
Sinais de hiperpressão do mecanismo extensor.

## 2023-03-27 IMAGING — MR JOELHO DIREITO
5 series · 40 of 40 positions shown · non-contrast
Comparison: none

[Series 2: localizar · axial · right · 6.0mm · 0.70mm/px · z∈[-38,+38]mm · 6 of 12 slices shown]
[im 1/12]
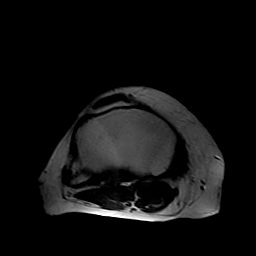
[im 3/12]
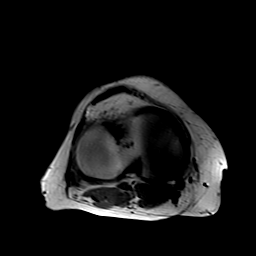
[im 5/12]
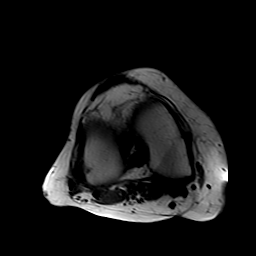
[im 7/12]
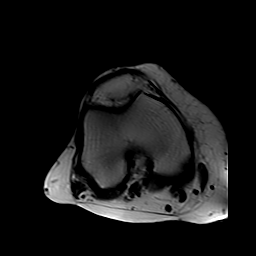
[im 9/12]
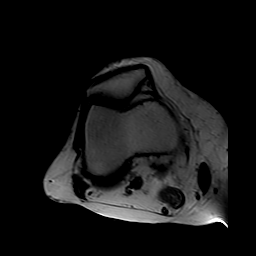
[im 12/12]
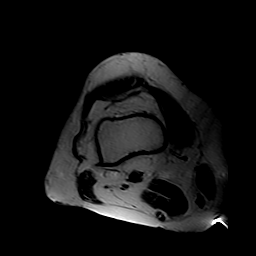

[Series 3: T1 · sagittal · right · 4.0mm · 0.70mm/px · 8 of 16 slices shown]
[im 1/16]
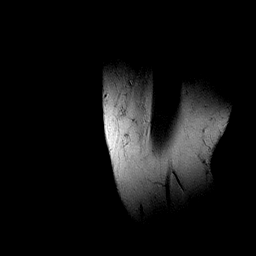
[im 3/16]
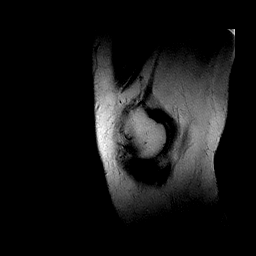
[im 5/16]
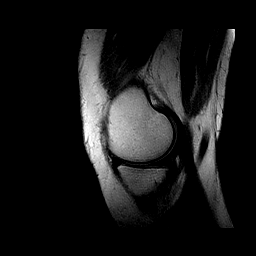
[im 7/16]
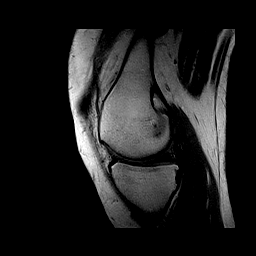
[im 9/16]
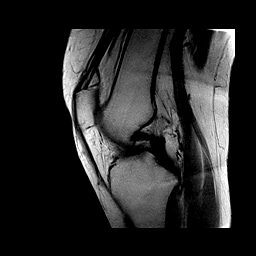
[im 11/16]
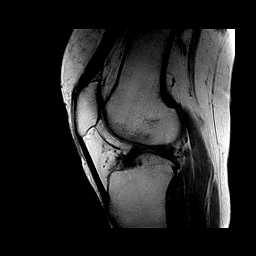
[im 13/16]
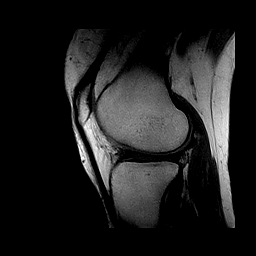
[im 16/16]
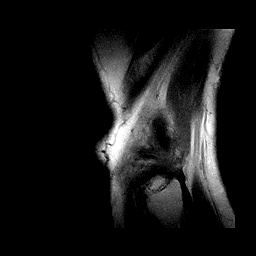

[Series 4: STIR · sagittal · right · 4.0mm · 0.70mm/px · 8 of 16 slices shown (1 of 3)]
[im 1/16]
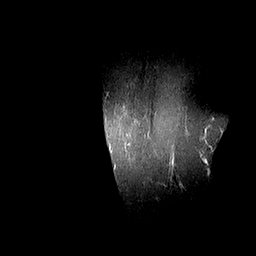
[im 3/16]
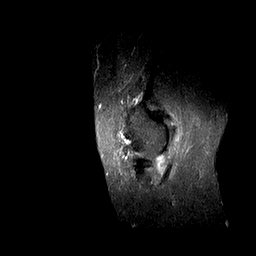
[im 5/16]
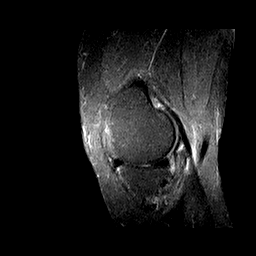
[im 7/16]
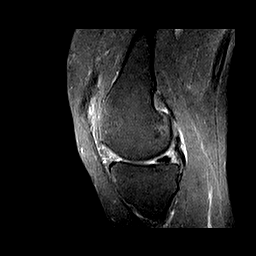
[im 9/16]
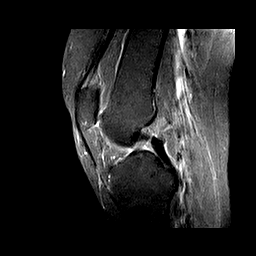
[im 11/16]
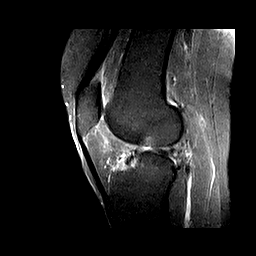
[im 13/16]
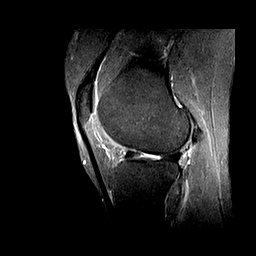
[im 16/16]
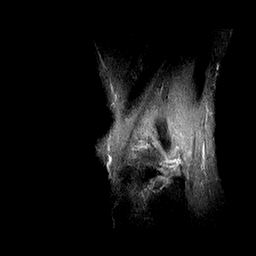

[Series 5: STIR · coronal · right · 4.0mm · 0.70mm/px · 8 of 16 slices shown (2 of 3)]
[im 1/16]
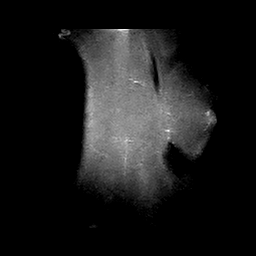
[im 3/16]
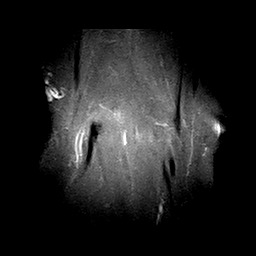
[im 5/16]
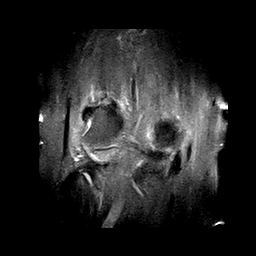
[im 7/16]
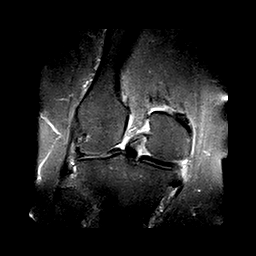
[im 9/16]
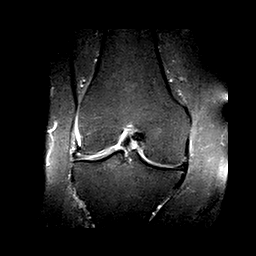
[im 11/16]
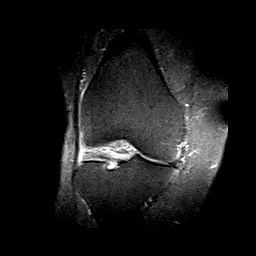
[im 13/16]
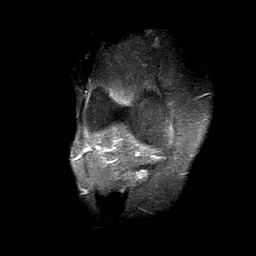
[im 16/16]
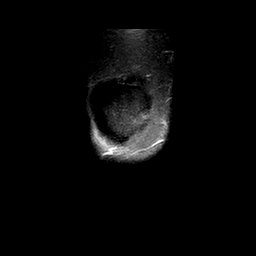

[Series 6: STIR · axial · right · 4.0mm · 0.78mm/px · z∈[-57,+47]mm · 10 of 20 slices shown (3 of 3)]
[im 1/20]
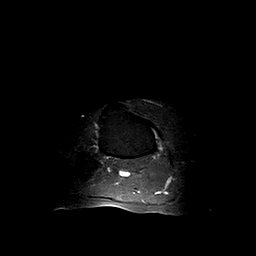
[im 3/20]
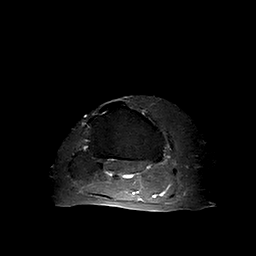
[im 5/20]
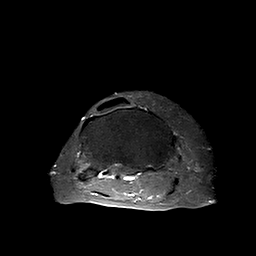
[im 7/20]
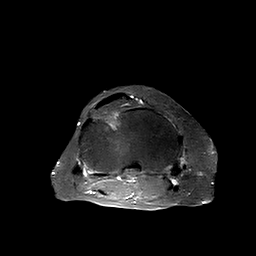
[im 9/20]
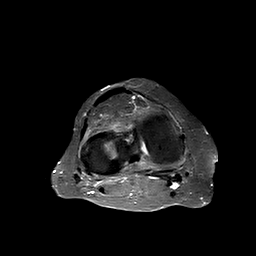
[im 11/20]
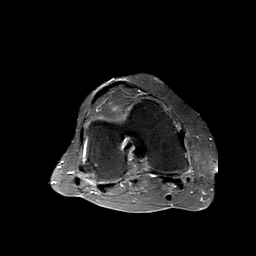
[im 13/20]
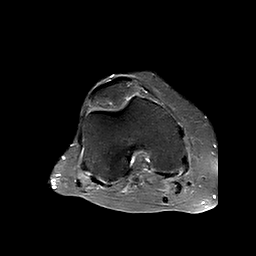
[im 15/20]
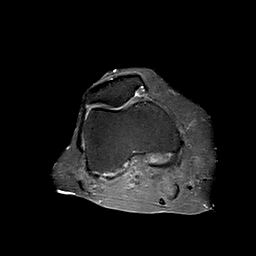
[im 17/20]
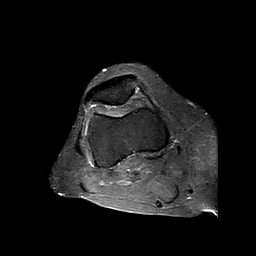
[im 20/20]
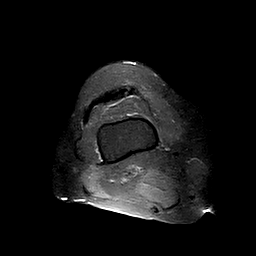

[40 of 40 positions shown; findings below may reference images not displayed]

RESSONÂNCIA MAGNÉTICA DO JOELHO DIREITO

TÉCNICA:
Exame realizado em equipamento de ressonância magnética com sequências, ponderações e planos específicos para o segmento de interesse, sem a administração endovenosa do meio de contraste.
RESULTADO:
Diminutas imagens císticas junto as espinhas tibiais.
Alterações degenerativas nos compartimentos femorotibiais caracterizadas por osteofitose marginal, heterogeneidade de sinal do revestimento condral e fissuras condrais atingindo as camadas médias e profundas na área de carga dos côndilos femorais, com focos tênues de edema ósseo subcondral, especialmente na margem posterossuperior do côndilo femoral lateral.
Alterações degenerativas leves no compartimento patelofemoral com osteofitose marginal, heterogeneidade de sinal do revestimento condral e fissuras condrais atingindo as camadas médias e profundas, determinando edema ósseo subcondral no terço médio superior do vértice e faceta patelar medial.
Menisco medial apresenta redução volumétrica irregular difusa, com amputação da margem livre do corpo, extrusão parcial para o recesso medial e sinal linear hiperintenso em praticamente toda sua extensão atingindo ambas superfícies meniscais, associado a edema perimeniscal.
Menisco lateral demonstra extensa amputação da margem livre do corpo e corno anterior, com extrusão parcial para o recesso lateral e sinal irregular hiperintenso em seu corno anterior atingindo a superfície meniscal inferior.
Ligamento colateral medial apresenta espessamento irregular das fibras medioproximais, com discreto edema periligamentar e abaulamento, secundários às alterações degenerativas do compartimento femorotibial medial. 
Ligamentos cruzados e colateral lateral íntegros.
Tendinose proximal do gastrocnêmio medial com delaminações intrasubstanciais e peritendinite.
Cisto poplíteo medindo 2,6 x 1,3 cm.
Distensão líquida da bursa da pata anserina.
Edema na topografia da gordura retroquadricipital.

CONCLUSÃO:
Diminutas imagens císticas junto as espinhas tibiais.
Complexa rotura degenerativa do menisco medial.
Extensa amputação da margem livre do corpo e corno anterior do menisco lateral com extrusão parcial e sinal irregular hiperintenso em seu corno anterior atingindo a superfície meniscal inferior.
Alterações degenerativas nos compartimentos femorotibiais com osteofitose marginal, heterogeneidade de sinal do revestimento condral, fissuras condrais nas camadas médias e profundas dos côndilos femorais e focos de edema ósseo subcondral no côndilo femoral lateral.
Alterações degenerativas no compartimento patelo troclear com osteofitose marginal, heterogeneidade de sinal do revestimento condral, fissuras condrais e edema ósseo subcondral no terço médio superior do vértice e faceta patelar medial.
Sequela de estiramento prévio do ligamento colateral medial.
Tendinose proximal do gastrocnêmio medial com delaminações intrassubstanciais e peritendinite.
Pequeno cisto poplíteo.
Bursite da pata anserina.
Sinais de hiperpressão do mecanismo extensor.

## 2023-04-20 ENCOUNTER — Encounter: Payer: MEDICARE | Attending: Foot & Ankle Surgery | Primary: Family

## 2023-04-27 ENCOUNTER — Ambulatory Visit: Admit: 2023-04-27 | Discharge: 2023-04-27 | Payer: MEDICARE | Attending: Internal Medicine | Primary: Family

## 2023-04-27 VITALS — BP 144/70 | HR 57 | Ht 64.0 in | Wt 154.0 lb

## 2023-04-27 DIAGNOSIS — K580 Irritable bowel syndrome with diarrhea: Secondary | ICD-10-CM

## 2023-04-27 MED ORDER — CHOLESTYRAMINE 4 G PO PACK
4 g | PACK | Freq: Two times a day (BID) | ORAL | 3 refills | Status: AC
Start: 2023-04-27 — End: 2023-05-03
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
## 2023-04-27 NOTE — Addendum Note (Signed)
 Addended by: Kathaleen Grinder on: 04/27/2023 10:24 AM     Modules accepted: Orders

## 2023-05-03 ENCOUNTER — Ambulatory Visit: Admit: 2023-05-03 | Discharge: 2023-05-03 | Payer: MEDICARE | Attending: Foot & Ankle Surgery | Primary: Family

## 2023-05-03 VITALS — Ht 64.0 in | Wt 154.0 lb

## 2023-05-03 DIAGNOSIS — B351 Tinea unguium: Secondary | ICD-10-CM

## 2023-05-07 NOTE — Progress Notes (Signed)
 SUBJECTIVE: Marissa Sawyer is a 73 y.o. female who returns to the office with chief complaint of painful fungal toenails. Patient relates toe nails are thickened/difficult to trim as well as painful with ambulation and with shoe gear.   Chief Complaint   Patient presents with    Nail Problem       B/l nail trim,last seen Doran Durand APRN CNP 03/21/23        Diabetes     Blood sugar 120      Review of Systems   Constitutional:  Negative for activity change, appetite change, chills, diaphoresis, fatigue and fever.   Respiratory:  Negative for shortness of breath.    Cardiovascular:  Negative for leg swelling.   Gastrointestinal:  Negative for diarrhea and nausea.   Endocrine: Negative for cold intolerance, heat intolerance and polyuria.   Musculoskeletal:  Positive for arthralgias. Negative for back pain, gait problem, joint swelling and myalgias.   Skin:  Negative for color change, pallor, rash and wound.   Allergic/Immunologic: Negative for environmental allergies and food allergies.   Neurological:  Negative for dizziness, weakness, light-headedness and numbness.   Hematological:  Does not bruise/bleed easily.   Psychiatric/Behavioral:  Negative for behavioral problems, confusion and self-injury. The patient is not nervous/anxious.      OBJECTIVE: Clinical evaluation of patient reveals nails 1,2,3,4,5 of the right foot and nails 1,2,3,4,5 of the left foot to present with thickness, elongation, discoloration, brittleness, and subungual debris. There was pain with palpation and debridement of the toenails of the bilateral feet. No open lesions noted to either foot today.   The right DP pulse is not palpable.   The left DP pulse is not palpable.   The right PT pulse is not palpable.   The left PT pulse is not palpable.   Protective sensation is present to the right plantar foot as noted with a 5.07 Semmes-Weinstein monofilament.   Protective sensation is present to the left plantar foot as noted with a 5.07  Semmes-Weinstein monofilament.   Glucose: 120 mg/dl.    Class A Findings (1 needed)   []  Non-traumatic amputation of foot or integral skeleton portion thereof.   []  Q7.      Class B Findings (2 needed)   1. [x]  Absent posterior tibial pulse   2. [x]  Absent dorsalis pedis pulse   3. []  Advanced trophic changes; three of the following are required:            []  hair growth (decrease or absence)            []  nail changes (thickening)            []  pigmentary changes (discoloration)            []  skin texture (thin, shiny)            []  skin color (rubor or redness)   [x]  Q8.      Class C Findings (1 Class B, 2 Class C needed)   1. []  Claudication   2. []  Temperature changes   3. []  Edema   4. []  Paresthesia   5. []  Burning   []  Q9.     ASSESSMENT:    Diagnosis Orders   1. Onychomycosis of toenail  DEBRIDEMENT OF NAILS, 6 OR MORE    HM DIABETES FOOT EXAM      2. Pain of toes of both feet  DEBRIDEMENT OF NAILS, 6 OR MORE    HM DIABETES FOOT EXAM  3. Type 2 diabetes mellitus with peripheral vascular disease (HCC)  DEBRIDEMENT OF NAILS, 6 OR MORE    HM DIABETES FOOT EXAM        PLAN: Toenails 1,2,3,4,5 of the right foot and 1,2,3,4,5 of the left foot were debrided in length and thickness using a nail nipper and a grinder. Return in about 9 weeks (around 07/05/2023) for At risk diabetic foot care.   05/03/2023      Ronette Deter, DPM

## 2023-05-21 ENCOUNTER — Encounter

## 2023-05-21 MED ORDER — CHOLESTYRAMINE 4 G PO PACK
4 | PACK | Freq: Two times a day (BID) | ORAL | 3 refills | 45.00000 days | Status: DC
Start: 2023-05-21 — End: 2023-09-28

## 2023-05-21 NOTE — Telephone Encounter (Signed)
 Patient lvm stating she has been experiencing issues with diarrhea for the past week, would like a call back to discuss. Diagnosed with IBS w diarrhea last seen by Dr Bridget Hartshorn on 04/27/23

## 2023-05-21 NOTE — Telephone Encounter (Signed)
 Patient called and stated that she has had diarrhea going on. Dr Bridget Hartshorn had prescribed Questran but it was cancelled by another doctors MA. Please resend.

## 2023-05-25 NOTE — Telephone Encounter (Signed)
 Patient called in stating that a 2 week supply was 95.00. It has been 1 week , her last bm was Tues and she feels bloated. Writer recommended increasing fruit/veggies, fiber (metamucil), water and a can of prune juice if her blood sugars are controlled along with exercise. Patient understood. Patient states she has not really eaten and she will try these things to help her symptoms. She will call next week to let us know if she is still struggling with these issues and we can give her a more affordable route to go.

## 2023-06-22 ENCOUNTER — Inpatient Hospital Stay
Admit: 2023-06-22 | Discharge: 2023-06-24 | Disposition: A | Discharge status: Home / Self Care | Payer: Medicare (Managed Care) | Admitting: Student in an Organized Health Care Education/Training Program

## 2023-06-22 ENCOUNTER — Emergency Department: Admit: 2023-06-22 | Payer: Medicare (Managed Care) | Primary: Family

## 2023-06-22 DIAGNOSIS — K353 Acute appendicitis with localized peritonitis, without perforation or gangrene: Secondary | ICD-10-CM

## 2023-06-22 LAB — MICROSCOPIC URINALYSIS
Casts UA: 0 /LPF — AB
Epithelial Cells, UA: 0 /HPF
RBC, UA: 0 /HPF
WBC, UA: 0 /HPF — AB

## 2023-06-22 LAB — CBC WITH AUTO DIFFERENTIAL
Basophils %: 0 % (ref 0–2)
Basophils Absolute: 0 10*3/uL (ref 0.0–0.2)
Eosinophils %: 0 % (ref 0–4)
Eosinophils Absolute: 0 10*3/uL (ref 0.0–0.4)
Hematocrit: 36.6 % (ref 36–46)
Hemoglobin: 12.2 g/dL (ref 12.0–16.0)
Lymphocytes %: 6 % — ABNORMAL LOW (ref 24–44)
Lymphocytes Absolute: 0.86 10*3/uL — ABNORMAL LOW (ref 1.0–4.8)
MCH: 29.1 pg (ref 26–34)
MCHC: 33.3 g/dL (ref 31–37)
MCV: 87.3 fL (ref 80–100)
MPV: 7.7 fL (ref 6.0–12.0)
Monocytes %: 8 % — ABNORMAL HIGH (ref 1–7)
Monocytes Absolute: 1.15 10*3/uL (ref 0.1–1.3)
Morphology: NORMAL
Neutrophils %: 86 % — ABNORMAL HIGH (ref 36–66)
Neutrophils Absolute: 12.39 10*3/uL — ABNORMAL HIGH (ref 1.3–9.1)
Platelets: 208 10*3/uL (ref 150–450)
RBC: 4.19 m/uL (ref 4.0–5.2)
RDW: 13.5 % (ref 11.5–14.9)
WBC: 14.4 10*3/uL — ABNORMAL HIGH (ref 3.5–11.0)

## 2023-06-22 LAB — COMPREHENSIVE METABOLIC PANEL
ALT: 15 U/L (ref 10–35)
AST: 24 U/L (ref 10–35)
Albumin: 4.2 g/dL (ref 3.5–5.2)
Alkaline Phosphatase: 63 U/L (ref 35–104)
Anion Gap: 12 mmol/L (ref 9–16)
BUN: 14 mg/dL (ref 8–23)
CO2: 23 mmol/L (ref 20–31)
Calcium: 9 mg/dL (ref 8.6–10.4)
Chloride: 103 mmol/L (ref 98–107)
Creatinine: 0.8 mg/dL (ref 0.7–1.2)
Est, Glom Filt Rate: 78 mL/min/{1.73_m2} (ref >60)
Glucose: 189 mg/dL — ABNORMAL HIGH (ref 74–99)
Potassium: 4.1 mmol/L (ref 3.7–5.3)
Sodium: 138 mmol/L (ref 136–145)
Total Bilirubin: 0.8 mg/dL (ref 0.0–1.2)
Total Protein: 7.6 g/dL (ref 6.6–8.7)

## 2023-06-22 LAB — LIPASE: Lipase: 21 U/L (ref 13–60)

## 2023-06-22 LAB — URINALYSIS WITH REFLEX TO CULTURE
Bilirubin, Urine: NEGATIVE
Ketones, Urine: NEGATIVE mg/dL
Leukocyte Esterase, Urine: NEGATIVE
Nitrite, Urine: NEGATIVE
Protein, UA: NEGATIVE mg/dL
Specific Gravity, UA: 1.05 — ABNORMAL HIGH (ref 1.000–1.030)
Urobilinogen, Urine: NORMAL EU/dL (ref 0.0–1.0)
pH, Urine: 5 (ref 5.0–8.0)

## 2023-06-22 LAB — LACTIC ACID: Lactic Acid: 1.4 mmol/L (ref 0.5–2.2)

## 2023-06-22 LAB — T4, FREE: T4 Free: 1.2 ng/dL (ref 0.9–1.7)

## 2023-06-22 LAB — POC GLUCOSE FINGERSTICK: POC Glucose: 148 mg/dL — ABNORMAL HIGH (ref 65–105)

## 2023-06-22 LAB — TSH: TSH: 0.64 u[IU]/mL (ref 0.27–4.20)

## 2023-06-22 MED ORDER — NORMAL SALINE FLUSH 0.9 % IV SOLN
0.9 | Freq: Two times a day (BID) | INTRAVENOUS | Status: DC
Start: 2023-06-22 — End: 2023-06-24

## 2023-06-22 MED ORDER — NORMAL SALINE FLUSH 0.9 % IV SOLN
0.9 | INTRAVENOUS | Status: DC | PRN
Start: 2023-06-22 — End: 2023-06-24

## 2023-06-22 MED ORDER — CHOLESTYRAMINE LIGHT 4 G PO PACK
4 | Freq: Two times a day (BID) | ORAL | Status: DC
Start: 2023-06-22 — End: 2023-06-24

## 2023-06-22 MED ORDER — DEXTROSE 10 % IV BOLUS
INTRAVENOUS | Status: DC | PRN
Start: 2023-06-22 — End: 2023-06-24

## 2023-06-22 MED ORDER — MAGNESIUM SULFATE 2 GM/50ML IV SOLN
2 | INTRAVENOUS | Status: DC | PRN
Start: 2023-06-22 — End: 2023-06-24

## 2023-06-22 MED ORDER — LABETALOL HCL 5 MG/ML IV SOLN
5 | INTRAVENOUS | Status: DC | PRN
Start: 2023-06-22 — End: 2023-06-24

## 2023-06-22 MED ORDER — DIPHENHYDRAMINE HCL 50 MG/ML IJ SOLN
50 | Freq: Once | INTRAMUSCULAR | Status: DC | PRN
Start: 2023-06-22 — End: 2023-06-24

## 2023-06-22 MED ORDER — ONDANSETRON HCL 4 MG/2ML IJ SOLN
4 | Freq: Once | INTRAMUSCULAR | Status: DC | PRN
Start: 2023-06-22 — End: 2023-06-24

## 2023-06-22 MED ORDER — SODIUM CHLORIDE 0.9 % IV BOLUS
0.9 | Freq: Once | INTRAVENOUS | Status: AC
Start: 2023-06-22 — End: 2023-06-22
  Administered 2023-06-22: 15:00:00 1000 mL via INTRAVENOUS

## 2023-06-22 MED ORDER — POTASSIUM CHLORIDE CRYS ER 20 MEQ PO TBCR
20 | ORAL | Status: DC | PRN
Start: 2023-06-22 — End: 2023-06-24

## 2023-06-22 MED ORDER — ONDANSETRON HCL 4 MG/2ML IJ SOLN
4 | INTRAMUSCULAR | Status: AC
Start: 2023-06-22 — End: ?

## 2023-06-22 MED ORDER — MORPHINE SULFATE 4 MG/ML IJ SOLN
4 | INTRAMUSCULAR | Status: DC | PRN
Start: 2023-06-22 — End: 2023-06-24

## 2023-06-22 MED ORDER — SUCCINYLCHOLINE CHLORIDE 200 MG/10ML IV SOSY
200 | INTRAVENOUS | Status: AC
Start: 2023-06-22 — End: ?

## 2023-06-22 MED ORDER — SODIUM CHLORIDE 0.9 % IV BOLUS
0.9 | Freq: Once | INTRAVENOUS | Status: AC
Start: 2023-06-22 — End: 2023-06-22
  Administered 2023-06-22: 16:00:00 100 mL via INTRAVENOUS

## 2023-06-22 MED ORDER — EPHEDRINE SULFATE (PRESSORS) 25 MG/5ML IV SOSY
25 | Freq: Once | INTRAVENOUS | Status: DC | PRN
Start: 2023-06-22 — End: 2023-06-22
  Administered 2023-06-22 (×2): 5 via INTRAVENOUS
  Administered 2023-06-22: 19:00:00 15 via INTRAVENOUS

## 2023-06-22 MED ORDER — DEXAMETHASONE SODIUM PHOSPHATE 4 MG/ML IJ SOLN
4 | Freq: Once | INTRAMUSCULAR | Status: DC | PRN
Start: 2023-06-22 — End: 2023-06-22
  Administered 2023-06-22: 19:00:00 4 via INTRAVENOUS

## 2023-06-22 MED ORDER — FENTANYL CITRATE (PF) 100 MCG/2ML IJ SOLN
100 | INTRAMUSCULAR | Status: AC
Start: 2023-06-22 — End: ?

## 2023-06-22 MED ORDER — LIDOCAINE HCL (PF) 2 % IJ SOLN
2 | INTRAMUSCULAR | Status: AC
Start: 2023-06-22 — End: ?

## 2023-06-22 MED ORDER — SODIUM CHLORIDE 0.9 % IV SOLN
0.9 | INTRAVENOUS | Status: DC
Start: 2023-06-22 — End: 2023-06-24
  Administered 2023-06-22 – 2023-06-24 (×2): via INTRAVENOUS

## 2023-06-22 MED ORDER — SODIUM CHLORIDE 0.9 % IV SOLN
0.9 | INTRAVENOUS | Status: DC | PRN
Start: 2023-06-22 — End: 2023-06-24

## 2023-06-22 MED ORDER — SODIUM CHLORIDE FLUSH 0.9 % IV SOLN
0.9 | INTRAVENOUS | Status: DC | PRN
Start: 2023-06-22 — End: 2023-06-24
  Administered 2023-06-22: 16:00:00 10 mL via INTRAVENOUS

## 2023-06-22 MED ORDER — INSULIN LISPRO 100 UNIT/ML IJ SOLN
100 | Freq: Four times a day (QID) | INTRAMUSCULAR | Status: DC
Start: 2023-06-22 — End: 2023-06-24
  Administered 2023-06-23: 1 [IU] via SUBCUTANEOUS

## 2023-06-22 MED ORDER — HYDRALAZINE HCL 20 MG/ML IJ SOLN
20 | INTRAMUSCULAR | Status: DC | PRN
Start: 2023-06-22 — End: 2023-06-24

## 2023-06-22 MED ORDER — TIZANIDINE HCL 2 MG PO TABS
2 | Freq: Three times a day (TID) | ORAL | Status: DC | PRN
Start: 2023-06-22 — End: 2023-06-24

## 2023-06-22 MED ORDER — BUPIVACAINE HCL (PF) 0.5 % IJ SOLN
0.5 | INTRAMUSCULAR | Status: DC | PRN
Start: 2023-06-22 — End: 2023-06-22
  Administered 2023-06-22: 19:00:00 20 via INTRADERMAL

## 2023-06-22 MED ORDER — METOCLOPRAMIDE HCL 5 MG/ML IJ SOLN
5 | Freq: Once | INTRAMUSCULAR | Status: DC | PRN
Start: 2023-06-22 — End: 2023-06-24

## 2023-06-22 MED ORDER — POTASSIUM CHLORIDE 10 MEQ/100ML IV SOLN
10 | INTRAVENOUS | Status: DC | PRN
Start: 2023-06-22 — End: 2023-06-24

## 2023-06-22 MED ORDER — LIDOCAINE HCL (PF) 2 % IJ SOLN
2 | Freq: Once | INTRAMUSCULAR | Status: DC | PRN
Start: 2023-06-22 — End: 2023-06-22
  Administered 2023-06-22: 19:00:00 70 via INTRAVENOUS

## 2023-06-22 MED ORDER — ROCURONIUM BROMIDE 50 MG/5ML IV SOLN
50 | Freq: Once | INTRAVENOUS | Status: DC | PRN
Start: 2023-06-22 — End: 2023-06-22
  Administered 2023-06-22: 19:00:00 5 via INTRAVENOUS
  Administered 2023-06-22: 19:00:00 45 via INTRAVENOUS

## 2023-06-22 MED ORDER — PIPERACILLIN SOD-TAZOBACTAM SO 3.375 (3-0.375) G IV SOLR
3.375 | Freq: Three times a day (TID) | INTRAVENOUS | Status: DC
Start: 2023-06-22 — End: 2023-06-22

## 2023-06-22 MED ORDER — ATORVASTATIN CALCIUM 20 MG PO TABS
20 | ORAL | Status: DC
Start: 2023-06-22 — End: 2023-06-24
  Administered 2023-06-23: 20 mg via ORAL

## 2023-06-22 MED ORDER — ACETAMINOPHEN 650 MG RE SUPP
650 | Freq: Four times a day (QID) | RECTAL | Status: DC | PRN
Start: 2023-06-22 — End: 2023-06-24

## 2023-06-22 MED ORDER — ONDANSETRON HCL 4 MG/2ML IJ SOLN
4 | Freq: Once | INTRAMUSCULAR | Status: DC | PRN
Start: 2023-06-22 — End: 2023-06-22
  Administered 2023-06-22: 19:00:00 4 via INTRAVENOUS

## 2023-06-22 MED ORDER — POTASSIUM BICARB-CITRIC ACID 20 MEQ PO TBEF
20 | ORAL | Status: DC | PRN
Start: 2023-06-22 — End: 2023-06-24

## 2023-06-22 MED ORDER — EPHEDRINE SULFATE (PRESSORS) 25 MG/5ML IV SOSY
25 | INTRAVENOUS | Status: AC
Start: 2023-06-22 — End: ?

## 2023-06-22 MED ORDER — FENTANYL CITRATE (PF) 100 MCG/2ML IJ SOLN
100 | Freq: Once | INTRAMUSCULAR | Status: DC | PRN
Start: 2023-06-22 — End: 2023-06-22
  Administered 2023-06-22 (×2): 50 via INTRAVENOUS

## 2023-06-22 MED ORDER — HYDROMORPHONE HCL 1 MG/ML IJ SOLN
1 | INTRAMUSCULAR | Status: DC | PRN
Start: 2023-06-22 — End: 2023-06-24

## 2023-06-22 MED ORDER — PROPOFOL 200 MG/20ML IV EMUL
200 | INTRAVENOUS | Status: AC
Start: 2023-06-22 — End: ?

## 2023-06-22 MED ORDER — MIDAZOLAM HCL 2 MG/2ML IJ SOLN
2 | INTRAMUSCULAR | Status: AC
Start: 2023-06-22 — End: ?

## 2023-06-22 MED ORDER — SODIUM CHLORIDE 0.9 % IV SOLN
0.9 | INTRAVENOUS | Status: DC
Start: 2023-06-22 — End: 2023-06-22
  Administered 2023-06-22: 18:00:00 via INTRAVENOUS

## 2023-06-22 MED ORDER — ROCURONIUM BROMIDE 50 MG/5ML IV SOLN
50 | INTRAVENOUS | Status: AC
Start: 2023-06-22 — End: ?

## 2023-06-22 MED ORDER — SODIUM CHLORIDE 0.9 % IV SOLN (ADDEASE)
0.9 | Freq: Once | INTRAVENOUS | Status: AC
Start: 2023-06-22 — End: 2023-06-22
  Administered 2023-06-22: 18:00:00 4500 mg via INTRAVENOUS

## 2023-06-22 MED ORDER — DICYCLOMINE HCL 10 MG/ML IM SOLN
10 | Freq: Once | INTRAMUSCULAR | Status: AC
Start: 2023-06-22 — End: 2023-06-22
  Administered 2023-06-22: 16:00:00 20 mg via INTRAMUSCULAR

## 2023-06-22 MED ORDER — ACETAMINOPHEN 325 MG PO TABS
325 | ORAL | Status: DC | PRN
Start: 2023-06-22 — End: 2023-06-24

## 2023-06-22 MED ORDER — DEXAMETHASONE SODIUM PHOSPHATE 4 MG/ML IJ SOLN
4 | INTRAMUSCULAR | Status: AC
Start: 2023-06-22 — End: ?

## 2023-06-22 MED ORDER — ONDANSETRON HCL 4 MG/2ML IJ SOLN
4 | Freq: Four times a day (QID) | INTRAMUSCULAR | Status: DC | PRN
Start: 2023-06-22 — End: 2023-06-24

## 2023-06-22 MED ORDER — GLUCOSE 4 G PO CHEW
4 | ORAL | Status: DC | PRN
Start: 2023-06-22 — End: 2023-06-24

## 2023-06-22 MED ORDER — DEXTROSE 10 % IV SOLN
10 | INTRAVENOUS | Status: DC | PRN
Start: 2023-06-22 — End: 2023-06-24

## 2023-06-22 MED ORDER — GLUCAGON (RDNA) 1 MG IJ KIT
1 | INTRAMUSCULAR | Status: DC | PRN
Start: 2023-06-22 — End: 2023-06-24

## 2023-06-22 MED ORDER — MAGNESIUM SULFATE IN D5W 1-5 GM/100ML-% IV SOLN
1-5 | INTRAVENOUS | Status: DC | PRN
Start: 2023-06-22 — End: 2023-06-24

## 2023-06-22 MED ORDER — MELATONIN 3 MG PO TABS
3 | Freq: Every evening | ORAL | Status: DC | PRN
Start: 2023-06-22 — End: 2023-06-24

## 2023-06-22 MED ORDER — PANTOPRAZOLE SODIUM 40 MG IV SOLR
40 | Freq: Every day | INTRAVENOUS | Status: DC
Start: 2023-06-22 — End: 2023-06-22

## 2023-06-22 MED ORDER — PROPOFOL 200 MG/20ML IV EMUL
200 | Freq: Once | INTRAVENOUS | Status: DC | PRN
Start: 2023-06-22 — End: 2023-06-22
  Administered 2023-06-22: 19:00:00 200 via INTRAVENOUS

## 2023-06-22 MED ORDER — ACETAMINOPHEN 325 MG PO TABS
325 | Freq: Four times a day (QID) | ORAL | Status: DC | PRN
Start: 2023-06-22 — End: 2023-06-24

## 2023-06-22 MED ORDER — SODIUM CHLORIDE 0.9 % IV SOLN (ADDEASE)
0.9 | Freq: Three times a day (TID) | INTRAVENOUS | Status: DC
Start: 2023-06-22 — End: 2023-06-24
  Administered 2023-06-22 – 2023-06-24 (×6): 3375 mg via INTRAVENOUS

## 2023-06-22 MED ORDER — BUSPIRONE HCL 10 MG PO TABS
10 | Freq: Three times a day (TID) | ORAL | Status: DC | PRN
Start: 2023-06-22 — End: 2023-06-24

## 2023-06-22 MED ORDER — BISACODYL 5 MG PO TBEC
5 | Freq: Every day | ORAL | Status: DC | PRN
Start: 2023-06-22 — End: 2023-06-24

## 2023-06-22 MED ORDER — SODIUM CHLORIDE (PF) 0.9 % IJ SOLN
0.9 | Freq: Every day | INTRAMUSCULAR | Status: DC
Start: 2023-06-22 — End: 2023-06-24
  Administered 2023-06-22 – 2023-06-24 (×3): 40 mg via INTRAVENOUS

## 2023-06-22 MED ORDER — METOCLOPRAMIDE HCL 5 MG/ML IJ SOLN
5 | Freq: Four times a day (QID) | INTRAMUSCULAR | Status: DC
Start: 2023-06-22 — End: 2023-06-24
  Administered 2023-06-22 – 2023-06-24 (×7): 5 mg via INTRAVENOUS

## 2023-06-22 MED ORDER — NORMAL SALINE FLUSH 0.9 % IV SOLN
0.9 | Freq: Two times a day (BID) | INTRAVENOUS | Status: DC
Start: 2023-06-22 — End: 2023-06-24
  Administered 2023-06-24 (×2): 10 mL via INTRAVENOUS

## 2023-06-22 MED ORDER — SUGAMMADEX SODIUM 200 MG/2ML IV SOLN
200 | INTRAVENOUS | Status: AC
Start: 2023-06-22 — End: ?

## 2023-06-22 MED ORDER — ONDANSETRON HCL 4 MG/2ML IJ SOLN
4 | Freq: Once | INTRAMUSCULAR | Status: AC
Start: 2023-06-22 — End: 2023-06-22
  Administered 2023-06-22: 15:00:00 4 mg via INTRAVENOUS

## 2023-06-22 MED ORDER — LISINOPRIL 10 MG PO TABS
10 | Freq: Every day | ORAL | Status: DC
Start: 2023-06-22 — End: 2023-06-24
  Administered 2023-06-22 – 2023-06-24 (×3): 10 mg via ORAL

## 2023-06-22 MED ORDER — ONDANSETRON 4 MG PO TBDP
4 | Freq: Three times a day (TID) | ORAL | Status: DC | PRN
Start: 2023-06-22 — End: 2023-06-24

## 2023-06-22 MED ORDER — MIDAZOLAM HCL 2 MG/2ML IJ SOLN
2 | Freq: Once | INTRAMUSCULAR | Status: DC | PRN
Start: 2023-06-22 — End: 2023-06-22
  Administered 2023-06-22: 19:00:00 2 via INTRAVENOUS

## 2023-06-22 MED ORDER — SUCCINYLCHOLINE CHLORIDE 200 MG/10ML IV SOSY
200 | Freq: Once | INTRAVENOUS | Status: DC | PRN
Start: 2023-06-22 — End: 2023-06-22
  Administered 2023-06-22: 19:00:00 120 via INTRAVENOUS

## 2023-06-22 MED ORDER — FENTANYL CITRATE (PF) 50 MCG/ML IJ SOLN
50 | INTRAMUSCULAR | Status: DC | PRN
Start: 2023-06-22 — End: 2023-06-24

## 2023-06-22 MED ORDER — IOPAMIDOL 76 % IV SOLN
76 | Freq: Once | INTRAVENOUS | Status: AC | PRN
Start: 2023-06-22 — End: 2023-06-22
  Administered 2023-06-22: 16:00:00 75 mL via INTRAVENOUS

## 2023-06-22 MED ORDER — MORPHINE SULFATE 2 MG/ML IJ SOLN
2 | INTRAMUSCULAR | Status: DC | PRN
Start: 2023-06-22 — End: 2023-06-24
  Administered 2023-06-22 – 2023-06-24 (×7): 2 mg via INTRAVENOUS

## 2023-06-22 MED ORDER — SUGAMMADEX SODIUM 200 MG/2ML IV SOLN
200 | Freq: Once | INTRAVENOUS | Status: DC | PRN
Start: 2023-06-22 — End: 2023-06-22
  Administered 2023-06-22: 20:00:00 200 via INTRAVENOUS

## 2023-06-22 MED ORDER — NALOXONE HCL 0.4 MG/ML IJ SOLN
0.4 | INTRAMUSCULAR | Status: DC | PRN
Start: 2023-06-22 — End: 2023-06-24

## 2023-06-22 MED ORDER — SODIUM CHLORIDE 0.9 % IV SOLN
0.9 | INTRAVENOUS | Status: DC
Start: 2023-06-22 — End: 2023-06-23
  Administered 2023-06-22: 22:00:00 via INTRAVENOUS

## 2023-06-22 MED FILL — DEXAMETHASONE SODIUM PHOSPHATE 4 MG/ML IJ SOLN: 4 MG/ML | INTRAMUSCULAR | Qty: 1 | Fill #0

## 2023-06-22 MED FILL — LIDOCAINE HCL (PF) 2 % IJ SOLN: 2 % | INTRAMUSCULAR | Qty: 2 | Fill #0

## 2023-06-22 MED FILL — PIPERACILLIN SOD-TAZOBACTAM SO 3.375 (3-0.375) G IV SOLR: 3.375 (3-0.375) g | INTRAVENOUS | Qty: 3375 | Fill #0

## 2023-06-22 MED FILL — DIPRIVAN 200 MG/20ML IV EMUL: 200 MG/20ML | INTRAVENOUS | Qty: 20 | Fill #0

## 2023-06-22 MED FILL — PIPERACILLIN SOD-TAZOBACTAM SO 4.5 (4-0.5) G IV SOLR: 4.5 (4-0.5) g | INTRAVENOUS | Qty: 4500 | Fill #0

## 2023-06-22 MED FILL — PANTOPRAZOLE SODIUM 40 MG IV SOLR: 40 MG | INTRAVENOUS | Qty: 40 | Fill #0

## 2023-06-22 MED FILL — LISINOPRIL 10 MG PO TABS: 10 MG | ORAL | Qty: 1 | Fill #0

## 2023-06-22 MED FILL — METOCLOPRAMIDE HCL 5 MG/ML IJ SOLN: 5 MG/ML | INTRAMUSCULAR | Qty: 2 | Fill #0

## 2023-06-22 MED FILL — FENTANYL CITRATE (PF) 100 MCG/2ML IJ SOLN: 100 MCG/2ML | INTRAMUSCULAR | Qty: 2 | Fill #0

## 2023-06-22 MED FILL — ONDANSETRON HCL 4 MG/2ML IJ SOLN: 4 MG/2ML | INTRAMUSCULAR | Qty: 2 | Fill #0

## 2023-06-22 MED FILL — MORPHINE SULFATE 2 MG/ML IJ SOLN: 2 mg/mL | INTRAMUSCULAR | Qty: 1 | Fill #0

## 2023-06-22 MED FILL — DICYCLOMINE HCL 10 MG/ML IM SOLN: 10 MG/ML | INTRAMUSCULAR | Qty: 2 | Fill #0

## 2023-06-22 MED FILL — XYLOCAINE-MPF 2 % IJ SOLN: 2 % | INTRAMUSCULAR | Qty: 2 | Fill #0

## 2023-06-22 MED FILL — SUCCINYLCHOLINE CHLORIDE 200 MG/10ML IV SOSY: 200 MG/10ML | INTRAVENOUS | Qty: 10 | Fill #0

## 2023-06-22 MED FILL — AKOVAZ 25 MG/5ML IV SOSY: 25 MG/5ML | INTRAVENOUS | Qty: 5 | Fill #0

## 2023-06-22 MED FILL — BRIDION 200 MG/2ML IV SOLN: 200 MG/2ML | INTRAVENOUS | Qty: 2 | Fill #0

## 2023-06-22 MED FILL — ROCURONIUM BROMIDE 50 MG/5ML IV SOLN: 50 MG/5ML | INTRAVENOUS | Qty: 5 | Fill #0

## 2023-06-22 MED FILL — MIDAZOLAM HCL 2 MG/2ML IJ SOLN: 2 MG/ML | INTRAMUSCULAR | Qty: 2 | Fill #0
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
## 2023-06-22 NOTE — Progress Notes (Signed)
 [facility] General Surgery   Oretha Birch, MD, FACS  Michial Akin. Elvin Hammer, APRN-CNP  [address], [address]  [place] , [place] [address]  P: [phone], F: [phone]    First Assist Operative Note      Patient: Marissa Sawyer  Date of Birth: 23-Jun-1950  MRN: [id number]    Date of Procedure: 07-19-23    Pre-Op Diagnosis Codes:  Acute appendicitis    Post-Op Diagnosis: Same       Procedure(s):  APPENDECTOMY LAPAROSCOPIC    Surgeon(s):  Keane Passe, MD    Assistant: Willma Hartmann, APRN - CNP was needed throughout the entirety of case for help with positioning the patient, instrumentation and exposure and wound closure.     Anesthesia: General    Detailed Description of Procedure: See Dr. Arnulfo Larch operative note from 2023-07-19 for further detail.    Electronically signed by Willma Hartmann, APRN - CNP on 06/23/2023 at 5:40 AM

## 2023-06-22 NOTE — Anesthesia Post-Procedure Evaluation (Signed)
 Department of Anesthesiology  Postprocedure Note    Patient: Marissa Sawyer  MRN: [id number]  Birthdate: [birth year]  Date of evaluation: 06/22/2023    Procedure Summary       Date: 06/22/23 Room / Location: [facility] OR 04 / [facility]    Anesthesia Start: 1430 Anesthesia Stop: 1615    Procedure: APPENDECTOMY LAPAROSCOPIC (Abdomen) Diagnosis:       Acute appendicitis, unspecified acute appendicitis type      (Acute appendicitis, unspecified acute appendicitis type [K35.80])    Surgeons: Keane Passe, MD Responsible Provider: Evins Hoffman, MD    Anesthesia Type: general ASA Status: 2 - Emergent            Anesthesia Type: No value filed.    Aldrete Phase I: Aldrete Score: 8    Aldrete Phase II:      Anesthesia Post Evaluation    Comments: POST- ANESTHESIA EVALUATION       Pt Name: Marissa Sawyer  MRN: [id number]  Birthdate: 1950-12-24  Date of evaluation: 06/22/2023  Time:  4:34 PM      BP (!) 132/56   Pulse 72   Temp 97 F (36.1 C) (Infrared)   Resp 16   Ht 1.626 m (5\' 4" )   Wt 69.9 kg (154 lb)   SpO2 99%   BMI 26.43 kg/m      Consciousness Level  Awake  Cardiopulmonary Status  Stable  Pain Adequately Treated YES  Nausea / Vomiting  NO  Adequate Hydration  YES  Anesthesia Related Complications NONE      Electronically signed by Evins Hoffman, MD on 06/22/2023 at 4:34 PM      No notable events documented.

## 2023-06-22 NOTE — ED Triage Notes (Signed)
 Pt reports abdominal pain with vomiting since yesterday. Pt has hx of diverticulitis and IBS.

## 2023-06-22 NOTE — Op Note (Signed)
 Operative Note      Patient: Marissa Sawyer  Date of Birth: [birth year]  MRN: [id number]    Date of Procedure: 06/22/2023      [facility] General Surgery   Oretha Birch, MD, FACS  Michial Akin. Elvin Hammer, APRN-CNP  [address]  [place] , [place] [address]  P: [phone], F: [phone]    Preoperative diagnosis: Acute appendicitis    Postoperative diagnosis: Same    Procedure: Laparoscopic appendectomy    Surgeon: Dr. Kirtland Perfect    First assistant: Aurelia Leeks Perry,CNP    Anesthesia: General    Preparation: Chloraprep    EBL: Less than 25 mL    Specimen: Appendix    Procedure: Informed consent was obtained.  Preoperative antibiotic was given.  Patient was taken to the operating room.  General anesthesia was given.  Foley catheter was placed.  Abdomen was prepped and draped in usual sterile fashion.  Timeout was done.  Subumbilical incision was made.  Hassan port was introduced using the open technique without any difficulty.  CO2 insufflation was carried out.  Scope was introduced.  2 additional ports were placed in usual fashion under direct visualization.  Patient was placed in a little Trendelenburg position with the left side turned.  Appendix was visualized was acutely inflamed and distended.  There was some purulent fluid/pus noted in the pelvic region and by the side of the appendix.  Infection present at the time of surgery.  Appendix was grasped with help of a grasper.  Mesentery control was obtained using laparoscopic LigaSure.  Careful dissection was continued down towards the base of the appendix.  Base of the appendix was transected using an Endo GIA stapler with a blue load.  Staple line was found to be complete and intact.  Appendicular mesentery was inspected.  No bleeding noted.  At this point appendix was retrieved using an Endo Catch and sent to pathology for further evaluation.  Pelvic fluid and fluid in the right paracolic gutter was suctioned out.  Given the operative findings I decided to  place a Jackson-Pratt drain this was accomplished in the usual fashion.  Drain was placed at the appendectomy site going towards the right paracolic gutter.  This was brought out through the 5 mm port site and secured.  Satisfactory drain placement was confirmed.  All the port sites are visualized.  No bleeding noted.  All the ports were removed.  Fascia and the skin was approximated in the usual fashion.  Local anesthetic was infiltrated.  Dermabond was applied.  Steri-Strips applied.  Sterile dressing was applied.  Patient tolerated procedure well and was transferred to the recovery room in stable condition.     First assistant helped with positioning the patient, instrumentation and exposure and wound closure.    -Recommendations: Operative findings discussed with the family.  Postoperative course recovery restrictions discussed.  Patient will be admitted to the hospital for further care.
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
## 2023-06-22 NOTE — Progress Notes (Signed)
 Blood sugar 148

## 2023-06-23 LAB — CBC WITH AUTO DIFFERENTIAL
Basophils %: 0 % (ref 0–2)
Basophils Absolute: 0 10*3/uL (ref 0.0–0.2)
Eosinophils %: 0 % (ref 0–4)
Eosinophils Absolute: 0 10*3/uL (ref 0.0–0.4)
Hematocrit: 33.1 % — ABNORMAL LOW (ref 36–46)
Hemoglobin: 11 g/dL — ABNORMAL LOW (ref 12.0–16.0)
Lymphocytes %: 8 % — ABNORMAL LOW (ref 24–44)
Lymphocytes Absolute: 1 10*3/uL (ref 1.0–4.8)
MCH: 29.4 pg (ref 26–34)
MCHC: 33.2 g/dL (ref 31–37)
MCV: 88.5 fL (ref 80–100)
MPV: 8.1 fL (ref 6.0–12.0)
Monocytes %: 8 % — ABNORMAL HIGH (ref 1–7)
Monocytes Absolute: 1 10*3/uL (ref 0.1–1.3)
Neutrophils %: 84 % — ABNORMAL HIGH (ref 36–66)
Neutrophils Absolute: 10.4 10*3/uL — ABNORMAL HIGH (ref 1.3–9.1)
Platelets: 177 10*3/uL (ref 150–450)
RBC: 3.74 m/uL — ABNORMAL LOW (ref 4.0–5.2)
RDW: 13.5 % (ref 11.5–14.9)
WBC: 12.3 10*3/uL — ABNORMAL HIGH (ref 3.5–11.0)

## 2023-06-23 LAB — BASIC METABOLIC PANEL W/ REFLEX TO MG FOR LOW K
Anion Gap: 10 mmol/L (ref 9–16)
BUN: 14 mg/dL (ref 8–23)
CO2: 24 mmol/L (ref 20–31)
Calcium: 8.3 mg/dL — ABNORMAL LOW (ref 8.6–10.4)
Chloride: 106 mmol/L (ref 98–107)
Creatinine: 0.9 mg/dL (ref 0.7–1.2)
Est, Glom Filt Rate: 68 mL/min/{1.73_m2} (ref >60)
Glucose: 151 mg/dL — ABNORMAL HIGH (ref 74–99)
Potassium: 3.9 mmol/L (ref 3.7–5.3)
Sodium: 140 mmol/L (ref 136–145)

## 2023-06-23 LAB — POC GLUCOSE FINGERSTICK
POC Glucose: 184 mg/dL — ABNORMAL HIGH (ref 65–105)
POC Glucose: 222 mg/dL — ABNORMAL HIGH (ref 65–105)
POC Glucose: 151 mg/dL — ABNORMAL HIGH (ref 65–105)
POC Glucose: 125 mg/dL — ABNORMAL HIGH (ref 65–105)
POC Glucose: 103 mg/dL (ref 65–105)

## 2023-06-23 MED ORDER — METRONIDAZOLE 500 MG PO TABS
500 | ORAL_TABLET | Freq: Three times a day (TID) | ORAL | 0 refills | 7.00000 days | Status: AC
Start: 2023-06-23 — End: 2023-06-30

## 2023-06-23 MED ORDER — ENOXAPARIN SODIUM 40 MG/0.4ML IJ SOSY
40 | Freq: Every day | INTRAMUSCULAR | Status: DC
Start: 2023-06-23 — End: 2023-06-24
  Administered 2023-06-24: 40 mg via SUBCUTANEOUS

## 2023-06-23 MED ORDER — OXYCODONE-ACETAMINOPHEN 5-325 MG PO TABS
5-325 | ORAL_TABLET | Freq: Four times a day (QID) | ORAL | 0 refills | 5.00000 days | Status: AC | PRN
Start: 2023-06-23 — End: 2023-06-30

## 2023-06-23 MED ORDER — ONDANSETRON HCL 4 MG PO TABS
4 | ORAL_TABLET | ORAL | 0 refills | 7.00000 days | Status: DC
Start: 2023-06-23 — End: 2023-07-17

## 2023-06-23 MED ORDER — CIPROFLOXACIN HCL 500 MG PO TABS
500 | ORAL_TABLET | Freq: Two times a day (BID) | ORAL | 0 refills | 7.00000 days | Status: AC
Start: 2023-06-23 — End: 2023-06-30

## 2023-06-23 MED FILL — MORPHINE SULFATE 2 MG/ML IJ SOLN: 2 mg/mL | INTRAMUSCULAR | Qty: 1 | Fill #0

## 2023-06-23 MED FILL — PIPERACILLIN SOD-TAZOBACTAM SO 3.375 (3-0.375) G IV SOLR: 3.375 (3-0.375) g | INTRAVENOUS | Qty: 3375 | Fill #0

## 2023-06-23 MED FILL — METOCLOPRAMIDE HCL 5 MG/ML IJ SOLN: 5 MG/ML | INTRAMUSCULAR | Qty: 2 | Fill #0

## 2023-06-23 MED FILL — ATORVASTATIN CALCIUM 20 MG PO TABS: 20 MG | ORAL | Qty: 1 | Fill #0

## 2023-06-23 MED FILL — PANTOPRAZOLE SODIUM 40 MG IV SOLR: 40 MG | INTRAVENOUS | Qty: 40 | Fill #0

## 2023-06-23 MED FILL — INSULIN LISPRO 100 UNIT/ML IJ SOLN: 100 UNIT/ML | INTRAMUSCULAR | Qty: 1 | Fill #0

## 2023-06-23 MED FILL — LISINOPRIL 10 MG PO TABS: 10 MG | ORAL | Qty: 1 | Fill #0

## 2023-06-23 NOTE — Progress Notes (Signed)
 Advance Care Planning     Advance Care Planning Inpatient Note  Spiritual Care Department    Today's Date: 06/23/2023  Unit: [facility] MED SURG    Received request from [employer].  Upon review of chart and communication with care team, patient's decision making abilities are not in question.. Patient and visitors  was/were present in the room during visit.    Goals of ACP Conversation:  Discuss advance care planning documents  Facilitate a discussion related to patient's goals of care as they align with the patient's values and beliefs.    Health Care Decision Makers:       Primary Decision Maker: Marissa Sawyer,Marissa Sawyer - Spouse - [phone]    Secondary Decision Maker: Marissa Sawyer Child [phone]  Summary:  No Decision Maker named by patient at this time    Advance Care Planning Documents (Patient Wishes):  None     Assessment:  Patient did not wish to discuss ACD at this time. Chaplain offered to leave ACD with patient, and patient agreed.    Interventions:  Patient DECLINED ACP conversation    Care Preferences Communicated:   No    Outcomes/Plan:  Patient declined advance care directive at this time.  Chaplain left packet of documents with patient to review.  Patient requested prayer for healing.  Chaplain provided prayer.  Chaplain indicated that further spiritual care is available as needed or requested.    Electronically signed by Leontine Rana, Chaplain on 06/23/2023 at 11:11 AM

## 2023-06-23 NOTE — Plan of Care (Signed)
 Problem: Chronic Conditions and Co-morbidities  Goal: Patient's chronic conditions and co-morbidity symptoms are monitored and maintained or improved  06/23/2023 1032 by Darrow End, RN  Outcome: Progressing     Problem: Discharge Planning  Goal: Discharge to home or other facility with appropriate resources  06/23/2023 1032 by Darrow End, RN  Outcome: Progressing     Problem: Pain  Goal: Verbalizes/displays adequate comfort level or baseline comfort level  06/23/2023 1032 by Darrow End, RN  Outcome: Progressing     Problem: ABCDS Injury Assessment  Goal: Absence of physical injury  06/23/2023 1032 by Darrow End, RN  Outcome: Progressing     Problem: Safety - Adult  Goal: Free from fall injury  06/23/2023 1032 by Darrow End, RN  Outcome: Progressing
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
## 2023-06-23 NOTE — Discharge Instructions (Addendum)
 Dr. Kirtland Perfect Post-Op Orders for Outpatient    1.) Diet:    [x]   As tolerated  []   Special     2.) Activity:    [x]   No lifting more than five to ten pounds 2-3 weeks  [x]   May Shower tomorrow  [x]   No driving until off pain medications     3.) Dressing:    [x]   Remove top dressing in 48 hours   [x]   Leave steri strips on monitor drain output twice daily    [x]   Remove and change dressing sooner if soaked    4.) Medication:    [x]   Take medication as prescribed   []   Resume blood thinners in  days    [x]   Resume home medications per AVS Summary    5.) Office visit: Follow up with Dr. Kirtland Perfect. Call to schedule an appointment if one has not been made.     6.) Call [phone] if you have any questions or concerns.    Electronically signed by Keane Passe, MD on 06/23/2023 at 5:33 PM  Your information:  Name: Marissa Sawyer  DOB: 02-24-1951    Your instructions:    ***    What to do after you leave the hospital:    Recommended diet: low fat, low cholesterol diet    Recommended activity: activity as tolerated        The following personal items were collected during your admission and were returned to you:    Belongings  Dental Appliances: None  Vision - Corrective Lenses: Eyeglasses, At bedside  Hearing Aid: None  Clothing: Pants, Shirt, Undergarments, Footwear, Jacket/Coat, At bedside  Jewelry: None  Body Piercings Removed: N/A  Electronic Devices: Cell Phone, Consulting civil engineer, At bedside  Weapons (Insurance underwriter): None  Other Valuables: Other (Comment) (none)  Home Medications: None  Valuables Given To: Patient  Provide Name(s) of Who Valuable(s) Were Given To: [name]  Responsible person(s) in the waiting room: Claudis Cumber, husband  Patient approves for provider to speak to responsible person post operatively: Yes    Information obtained by:  By signing below, I understand that if any problems occur once I leave the hospital I am to contact my PCP or go to the ER.  I understand and acknowledge receipt of the  instructions indicated above.

## 2023-06-23 NOTE — Care Coordination-Inpatient (Signed)
 Case Management Assessment  Initial Evaluation    Date/Time of Evaluation: 06/23/2023 2:48 PM  Assessment Completed by: Marissa Sawyer, MSW, LSW    If patient is discharged prior to next notation, then this note serves as note for discharge by case management.    Patient Name: Marissa Sawyer                   Date of Birth: 01-12-1951  Diagnosis: Acute appendicitis [K35.80]  Acute appendicitis, unspecified acute appendicitis type [K35.80]  Acute appendicitis with localized peritonitis, without perforation, abscess, or gangrene [K35.30]                   Date / Time: 06/22/2023 11:10 AM    Patient Admission Status: Inpatient   Readmission Risk (Low < 19, Mod (19-27), High > 27): Readmission Risk Score: 7.2    Current PCP: Marissa Mcardle, APRN - CNP  PCP verified by CM? Yes    Chart Reviewed: Yes      History Provided by: Patient  Patient Orientation: Alert and Oriented    Patient Cognition: Alert    Hospitalization in the last 30 days (Readmission):  No    If yes, Readmission Assessment in CM Navigator will be completed.    Advance Directives:      Code Status: Full Code   Patient's Primary Decision Maker is: Legal Next of Kin    Primary Decision Maker: Marissa Sawyer - Spouse - [phone]    Secondary Decision Maker: Marissa Sawyer Child [phone]    Discharge Planning:    Patient lives with: Spouse/Significant Other Type of Home: House  Primary Care Giver: Self  Patient Support Systems include: Spouse/Significant Other   Current Financial resources: Medicare  Current community resources: None  Current services prior to admission: None            Current DME:              Type of Home Care services:  None    ADLS  Prior functional level: Independent in ADLs/IADLs  Current functional level: Independent in ADLs/IADLs    PT AM-PAC: 18 /24  OT AM-PAC: 19 /24    Family can provide assistance at DC: No  Would you like Case Management to discuss the discharge plan with any other family members/significant others, and if  so, who? No  Plans to Return to Present Housing: Yes  Other Identified Issues/Barriers to RETURNING to current housing: none  Potential Assistance needed at discharge: N/A            Potential DME:    Patient expects to discharge to: House  Plan for transportation at discharge: Family    Financial    Payor: [place]ARE / Plan: [place] / Product Type: *No Product type* /     Does insurance require precert for SNF: Yes    Potential assistance Purchasing Medications: No  Meds-to-Beds request:        [facility] [phone] [place], [place] - [address] [place] - P [phone] Marissa Sawyer [phone]  [address]  [place] [address]  Phone: [phone] Fax: [phone]      Notes:    Factors facilitating achievement of predicted outcomes: Family support, Cooperative, and Pleasant    Barriers to discharge: Decreased endurance    Additional Case Management Notes: 4/19- BCBS MEDICARE. Patient is from 2 story home with spouse. Independent with ADL's. DME: none VNS: none/denies. Admitted for lap  appy. IV Zosyn. Will follow for needs/ sc     The Plan for Transition of Care is related to the following treatment goals of Acute appendicitis [K35.80]  Acute appendicitis, unspecified acute appendicitis type [K35.80]  Acute appendicitis with localized peritonitis, without perforation, abscess, or gangrene [K35.30]    IF APPLICABLE: The Patient and/or patient representative Marissa Sawyer and her family were provided with a choice of provider and agrees with the discharge plan. Freedom of choice list with basic dialogue that supports the patient's individualized plan of care/goals and shares the quality data associated with the providers was provided to: Patient   Patient Representative Name:       The Patient and/or Patient Representative Agree with the Discharge Plan? Yes    Marissa Sawyer, MSW, LSW  Case Management Department  Ph: [phone] Fax:

## 2023-06-23 NOTE — Discharge Instructions (Addendum)
 Continuity of Care Form    Patient Name: Marissa Sawyer   DOB:  [birth year]  MRN:  [id number]    Admit date:  06/22/2023  Discharge date:  ***    Code Status Order: Full Code   Advance Directives:    Date/Time Healthcare Directive Type of Healthcare Directive Copy in Chart Healthcare Agent Appointed Healthcare Agent's Name Healthcare Agent's Phone Number    06/22/23 1417 No, patient does not have an advance directive for healthcare treatment  --  --  --  --  --             Admitting Physician:  Nova Began, MD  PCP: James Mcardle, APRN - CNP    Discharging Nurse: [facility] Unit/Room#: 2048/2048-01  Discharging Unit Phone Number: ***    Emergency Contact:   Extended Emergency Contact Information  Primary Emergency Contact: Zylka,Juan  Address: [address]           [place], [place] [address] [place]  Home Phone: [phone]  Relation: Spouse  Secondary Emergency Contact: Villilon,Erica   [place]  Home Phone: [phone]  Mobile Phone: [phone]  Relation: Child    Past Surgical History:  Past Surgical History:   Procedure Laterality Date    BREAST BIOPSY      CHOLECYSTECTOMY  1971    COLONOSCOPY  04/25/2012    diverticulosis    COLONOSCOPY  2000    normal    COLONOSCOPY  1998    tubular adenoma    COLONOSCOPY N/A 07/28/2020    COLONOSCOPY WITH RANDOM COLON BIOPSY AND SIGMOID POLYP REMOVAL WITH SNARE performed by Lela Purple, MD at [facility] ENDO    LAPAROSCOPIC APPENDECTOMY N/A 06/22/2023    APPENDECTOMY LAPAROSCOPIC performed by Keane Passe, MD at [facility] OR    PR EGD TRANSORAL BIOPSY SINGLE/MULTIPLE N/A 06/01/2015    EGD BIOPSY performed by Lela Purple, MD at [facility] OR    TUBAL LIGATION      UPPER GASTROINTESTINAL ENDOSCOPY  04/25/2012    gastric tubular adenoma    UPPER GASTROINTESTINAL ENDOSCOPY  01/16/2007    hyperplastic polyps x 2 in the stomach     UPPER GASTROINTESTINAL ENDOSCOPY  2001    normal    UPPER GASTROINTESTINAL ENDOSCOPY  2000    gastric adenoma     UPPER GASTROINTESTINAL ENDOSCOPY  01/07/2014    ? flat polyp, pathology--chronic inflammtion    UPPER GASTROINTESTINAL ENDOSCOPY  06/01/2015    no lesions, pathology--mild inflammation    UPPER GASTROINTESTINAL ENDOSCOPY N/A 07/28/2020    EGD BIOPSY OF ESOPHAGUS performed by Lela Purple, MD at [facility] ENDO       Immunization History:   Immunization History   Administered Date(s) Administered    COVID-19, J&J, (age 18y+), IM, 0.5 mL 05/11/2019    Pneumococcal, PCV-13, PREVNAR 13, (age 6w+), IM, 0.50mL 09/26/2016    Pneumococcal, PPSV23, PNEUMOVAX 23, (age 2y+), SC/IM, 0.81mL 03/28/2018       Active Problems:  Patient Active Problem List   Diagnosis Code    Hyperlipidemia E78.5    HTN (hypertension) I10    OA (osteoarthritis) M19.90    Anxiety F41.9    Tubular adenoma D36.9    Dermatophytosis of nail B35.1    Benign neoplasm of skin D23.9    Diverticulosis of colon K57.30    Chronic GERD K21.9    History of gastric polyp Z87.19    Controlled type 2 diabetes mellitus without  complication, without long-term current use of insulin  E11.9    Acute appendicitis K35.80       Isolation/Infection:   Isolation            No Isolation          Patient Infection Status    None to display              Nurse Assessment:  Last Vital Signs: BP 132/60   Pulse (!) 48   Temp 97.7 F (36.5 C) (Oral)   Resp 16   Ht 1.626 m (5\' 4" )   Wt 69.9 kg (154 lb)   SpO2 96%   BMI 26.43 kg/m     Last documented pain score (0-10 scale): Pain Level: 2  Last Weight:   Wt Readings from Last 1 Encounters:   06/23/23 69.9 kg (154 lb)     Mental Status:  {IP PT MENTAL STATUS:20030}    IV Access:  {MH COC IV ACCESS:304088262}    Nursing Mobility/ADLs:  Walking   {CHP DME ZOXW:960454098}  Transfer  {CHP DME JXBJ:478295621}  Bathing  {CHP DME HYQM:578469629}  Dressing  {CHP DME BMWU:132440102}  Toileting  {CHP DME VOZD:664403474}  Feeding  {CHP DME QVZD:638756433}  Med Admin  {CHP DME IRJJ:884166063}  Med Delivery   {MH COC MED  Delivery:304088264}    Wound Care Documentation and Therapy:  Incision 06/22/23 Abdomen (Active)   Dressing Status Clean;Dry;Intact 06/23/23 0808   Dressing/Treatment Non-adherent;Tegaderm/transparent film dressing;Steri-strips 06/23/23 0808   Closure Sutures 06/23/23 0808   Incision Assessment Other (Comment) 06/23/23 0808   Drainage Amount None (dry) 06/23/23 0808   Number of days: 1        Elimination:  Continence:   Bowel: {YES / KZ:60109}  Bladder: {YES / NA:35573}  Urinary Catheter: {Urinary Catheter:304088013}   Colostomy/Ileostomy/Ileal Conduit: {YES / UK:02542}       Date of Last BM: ***    Intake/Output Summary (Last 24 hours) at 06/23/2023 1733  Last data filed at 06/23/2023 0807  Gross per 24 hour   Intake --   Output 55 ml   Net -55 ml     I/O last 3 completed shifts:  In: 900 [I.V.:900]  Out: 110 [Urine:50; Drains:45; Blood:15]    Safety Concerns:     {MH COC Safety Concerns:304088272}    Impairments/Disabilities:      {MH COC Impairments/Disabilities:304088273}    Nutrition Therapy:  Current Nutrition Therapy:   {MH COC Diet List:304088271}    Routes of Feeding: {CHP DME Other Feedings:304088042}  Liquids: {Slp liquid thickness:30034}  Daily Fluid Restriction: {CHP DME Yes amt example:304088041}  Last Modified Barium Swallow with Video (Video Swallowing Test): {Done Not Done HCWC:376283151}    Treatments at the Time of Hospital Discharge:   Respiratory Treatments: ***  Oxygen Therapy:  {Therapy; copd oxygen:17808}  Ventilator:    {MH CC Vent VOHY:073710626}    Rehab Therapies: {THERAPEUTIC [phone]}  Weight Bearing Status/Restrictions: {MH CC Weight Bearing:304508812}  Other Medical Equipment (for information only, NOT a DME order):  {EQUIPMENT:304520077}  Other Treatments: ***    Patient's personal belongings (please select all that are sent with patient):  {CHP DME Belongings:304088044}    RN SIGNATURE:  {Esignature:304088025}    CASE MANAGEMENT/SOCIAL WORK SECTION    Inpatient Status  Date: ***    Readmission Risk Assessment Score:  [facility] RISK OF UNPLANNED READMISSION 2.0             7.2 Total Score  Discharging to Facility/ Agency   Name:   Address:  Phone:  Fax:    Dialysis Facility (if applicable)   Name:  Address:  Dialysis Schedule:  Phone:  Fax:    Case Manager/Social Worker signature: {Esignature:304088025}    PHYSICIAN SECTION    Prognosis: Good    Condition at Discharge: Stable    Rehab Potential (if transferring to Rehab): Good    Recommended Labs or Other Treatments After Discharge:     Physician Certification: I certify the above information and transfer of Marissa Sawyer  is necessary for the continuing treatment of the diagnosis listed and that she requires Home Care for less 30 days.     Update Admission H&P: No change in H&P    PHYSICIAN SIGNATURE:  Electronically signed by Keane Passe, MD on 06/23/23 at 5:33 PM EDT

## 2023-06-23 NOTE — Plan of Care (Signed)
 Problem: Chronic Conditions and Co-morbidities  Goal: Patient's chronic conditions and co-morbidity symptoms are monitored and maintained or improved  Outcome: Progressing     Problem: Discharge Planning  Goal: Discharge to home or other facility with appropriate resources  Outcome: Progressing     Problem: Pain  Goal: Verbalizes/displays adequate comfort level or baseline comfort level  Outcome: Progressing     Problem: ABCDS Injury Assessment  Goal: Absence of physical injury  Outcome: Progressing  Flowsheets (Taken 06/22/2023 1958)  Absence of Physical Injury: Implement safety measures based on patient assessment     Problem: Safety - Adult  Goal: Free from fall injury  Outcome: Progressing
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
## 2023-06-24 LAB — CBC WITH AUTO DIFFERENTIAL
Basophils %: 0 % (ref 0–2)
Basophils Absolute: 0 10*3/uL (ref 0.0–0.2)
Eosinophils %: 1 % (ref 0–4)
Eosinophils Absolute: 0.1 10*3/uL (ref 0.0–0.4)
Hematocrit: 34.3 % — ABNORMAL LOW (ref 36–46)
Hemoglobin: 11.2 g/dL — ABNORMAL LOW (ref 12.0–16.0)
Lymphocytes %: 27 % (ref 24–44)
Lymphocytes Absolute: 2.7 10*3/uL (ref 1.0–4.8)
MCH: 29.4 pg (ref 26–34)
MCHC: 32.8 g/dL (ref 31–37)
MCV: 89.7 fL (ref 80–100)
MPV: 8.1 fL (ref 6.0–12.0)
Monocytes %: 9 % — ABNORMAL HIGH (ref 1–7)
Monocytes Absolute: 0.9 10*3/uL (ref 0.1–1.3)
Neutrophils %: 63 % (ref 36–66)
Neutrophils Absolute: 6.5 10*3/uL (ref 1.3–9.1)
Platelets: 185 10*3/uL (ref 150–450)
RBC: 3.82 m/uL — ABNORMAL LOW (ref 4.0–5.2)
RDW: 13.7 % (ref 11.5–14.9)
WBC: 10.2 10*3/uL (ref 3.5–11.0)

## 2023-06-24 LAB — BASIC METABOLIC PANEL W/ REFLEX TO MG FOR LOW K
Anion Gap: 10 mmol/L (ref 9–16)
BUN: 13 mg/dL (ref 8–23)
CO2: 22 mmol/L (ref 20–31)
Calcium: 8.1 mg/dL — ABNORMAL LOW (ref 8.6–10.4)
Chloride: 110 mmol/L — ABNORMAL HIGH (ref 98–107)
Creatinine: 0.9 mg/dL (ref 0.7–1.2)
Est, Glom Filt Rate: 68 mL/min/{1.73_m2} (ref >60)
Glucose: 108 mg/dL — ABNORMAL HIGH (ref 74–99)
Potassium: 3.8 mmol/L (ref 3.7–5.3)
Sodium: 142 mmol/L (ref 136–145)

## 2023-06-24 LAB — POC GLUCOSE FINGERSTICK
POC Glucose: 102 mg/dL (ref 65–105)
POC Glucose: 103 mg/dL (ref 65–105)
POC Glucose: 142 mg/dL — ABNORMAL HIGH (ref 65–105)

## 2023-06-24 MED FILL — MORPHINE SULFATE 2 MG/ML IJ SOLN: 2 mg/mL | INTRAMUSCULAR | Qty: 1 | Fill #0

## 2023-06-24 MED FILL — LISINOPRIL 10 MG PO TABS: 10 MG | ORAL | Qty: 1 | Fill #0

## 2023-06-24 MED FILL — PIPERACILLIN SOD-TAZOBACTAM SO 3.375 (3-0.375) G IV SOLR: 3.375 (3-0.375) g | INTRAVENOUS | Qty: 3375 | Fill #0

## 2023-06-24 MED FILL — PANTOPRAZOLE SODIUM 40 MG IV SOLR: 40 MG | INTRAVENOUS | Qty: 40 | Fill #0

## 2023-06-24 MED FILL — ENOXAPARIN SODIUM 40 MG/0.4ML IJ SOSY: 40 MG/0.4ML | INTRAMUSCULAR | Qty: 0.4 | Fill #0

## 2023-06-24 MED FILL — METOCLOPRAMIDE HCL 5 MG/ML IJ SOLN: 5 MG/ML | INTRAMUSCULAR | Qty: 2 | Fill #0
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
## 2023-06-24 NOTE — Progress Notes (Incomplete)
Patient discharged from unit. Patient was provided discharge instructions and education prior to departure from unit. All personal belongings provided to patient before discharge. IV removed and all questions answered.

## 2023-06-24 NOTE — Progress Notes (Signed)
 [facility]  Family Medicine        Progress Note      Date:   06/24/2023  Patient name:  Marissa Sawyer  Date of admission:  06/22/2023 11:10 AM  MRN:   [id number]  Date of Birth:  10-12-1950        Brief HPI    Acute appendicitis    ASSESSMENT/PLAN     Hospital Problems           Last Modified POA    * (Principal) Acute appendicitis 06/22/2023 Yes    Hyperlipidemia 06/23/2023 Yes    HTN (hypertension) 06/23/2023 Yes    Anxiety 06/23/2023 Yes    Controlled type 2 diabetes mellitus without complication, without long-term current use of insulin  06/23/2023 Yes         Pt tolerating full liquid, change to lactose free  Hold metformin , would consider discontinuing metformin  outpatient and trying januvia or something with less GI side effects. Continue sliding scale  Continue zosyn, pain control per surgery  Hydralazine PRN discontinue labetalol due to bradycardia. OK to stop tele once order expires  Continue buspar   Continue lipitor       DVT:Lovenox   Code Status: Full Code   ADULT DIET; Full Liquid; Lactose-Controlled         SUBJECTIVE:     Patient was seen and examined at bedside. No acute events overnight. Pt had bowel movement, still has some abdominal cramping but is tolerating full liquid diet. No fevers.  Notes, labs & imaging reviewed. Case was discussed with nursing staff.       Review of Systems   Constitutional:  Negative for fever.   Respiratory:  Negative for shortness of breath.    Cardiovascular:  Negative for chest pain.   Gastrointestinal:  Positive for abdominal pain. Negative for nausea and vomiting.   Genitourinary:  Negative for difficulty urinating.   Neurological:  Negative for headaches.         OBJECTIVE:     BP (!) 143/50   Pulse 72   Temp 98.1 F (36.7 C)   Resp 18   Ht 1.626 m (5\' 4" )   Wt 69.1 kg (152 lb 5.4 oz)   SpO2 95%   BMI 26.15 kg/m      Physical Exam  Vitals and nursing note reviewed.   Constitutional:       General: She is not in acute distress.     Appearance:  Normal appearance. She is not ill-appearing.   Eyes:      Extraocular Movements: Extraocular movements intact.      Conjunctiva/sclera: Conjunctivae normal.   Cardiovascular:      Rate and Rhythm: Normal rate and regular rhythm.      Pulses: Normal pulses.      Heart sounds: Normal heart sounds.   Pulmonary:      Effort: Pulmonary effort is normal. No respiratory distress.      Comments: Diminished at bases  Abdominal:      Tenderness: There is abdominal tenderness.      Comments: Abdominal binder in place   Neurological:      General: No focal deficit present.      Mental Status: She is alert and oriented to person, place, and time.          Intake/Output:    Intake/Output Summary (Last 24 hours) at 06/24/2023 1113  Last data filed at 06/24/2023 0856  Gross per 24 hour   Intake 1038.01  ml   Output 155 ml   Net 883.01 ml         Laboratory Testing:  CBC:   Recent Labs     06/24/23  0751   WBC 10.2   HGB 11.2*   PLT 185     BMP:    Recent Labs     06/22/23  1117 06/23/23  0642 06/24/23  0648   NA 138 140 142   K 4.1 3.9 3.8   CL 103 106 110*   CO2 23 24 22    BUN 14 14 13    CREATININE 0.8 0.9 0.9   GLUCOSE 189* 151* 108*     Magnesium :   Lab Results   Component Value Date/Time    MG 2.0 06/23/2022 12:48 PM     Phosphorus: No results found for: "PHOS"  Ionized Calcium : No results found for: "CAION"   PT/INR:  No results found for: "PROTIME", "INR"  PTT:  No results found for: "APTT"        Nova Began, MD   06/24/2023 11:13 AM

## 2023-06-24 NOTE — Plan of Care (Signed)
 Problem: Chronic Conditions and Co-morbidities  Goal: Patient's chronic conditions and co-morbidity symptoms are monitored and maintained or improved  Outcome: Progressing  Flowsheets (Taken 06/23/2023 2000)  Care Plan - Patient's Chronic Conditions and Co-Morbidity Symptoms are Monitored and Maintained or Improved:   Monitor and assess patient's chronic conditions and comorbid symptoms for stability, deterioration, or improvement   Collaborate with multidisciplinary team to address chronic and comorbid conditions and prevent exacerbation or deterioration   Update acute care plan with appropriate goals if chronic or comorbid symptoms are exacerbated and prevent overall improvement and discharge     Problem: Discharge Planning  Goal: Discharge to home or other facility with appropriate resources  Outcome: Progressing  Flowsheets (Taken 06/23/2023 2000)  Discharge to home or other facility with appropriate resources:   Identify barriers to discharge with patient and caregiver   Arrange for needed discharge resources and transportation as appropriate   Identify discharge learning needs (meds, wound care, etc)     Problem: Pain  Goal: Verbalizes/displays adequate comfort level or baseline comfort level  Outcome: Progressing  Flowsheets (Taken 06/24/2023 0307)  Verbalizes/displays adequate comfort level or baseline comfort level:   Encourage patient to monitor pain and request assistance   Assess pain using appropriate pain scale   Administer analgesics based on type and severity of pain and evaluate response   Implement non-pharmacological measures as appropriate and evaluate response     Problem: ABCDS Injury Assessment  Goal: Absence of physical injury  Outcome: Progressing  Flowsheets (Taken 06/22/2023 1958 by Hazelton, Janelle, RN)  Absence of Physical Injury: Implement safety measures based on patient assessment     Problem: Safety - Adult  Goal: Free from fall injury  Outcome: Progressing  Flowsheets (Taken  06/24/2023 0307)  Free From Fall Injury: Instruct family/caregiver on patient safety

## 2023-06-24 NOTE — Care Coordination-Inpatient (Signed)
 DISCHARGE PLANNING NOTE:    Reviewed previous case management notes, and discharge plan is home without needs. VNS has been declined.    POD#2 Lap Appy. Full liquid diet. IV Zosyn.    Unable to schedule post op appt with Dr. Kirtland Perfect as it is Sunday.  Follow up info for Dr. Kirtland Perfect placed in AVS.    Will continue to follow for additional discharge needs.    Electronically signed by Philemon Braver, RN on 06/24/2023 at 8:10 AM

## 2023-06-24 NOTE — Discharge Summary (Signed)
 [facility]  Family Medicine      Discharge Summary      NAME:  Marissa Sawyer  DOB:  1950/12/24  MRN:  [id number]    Admit date:  06/22/2023  Discharge date:  06/24/2023    Admitting Physician:  Nova Began, MD    Primary Diagnosis on Admission:   Present on Admission:   Acute appendicitis   Controlled type 2 diabetes mellitus without complication, without long-term current use of insulin    Anxiety   Hyperlipidemia   HTN (hypertension)      Secondary Diagnoses:  does not have any pertinent problems on file.      Admission Condition:  poor     Discharged Condition: good      Hospital Course:   The patient was admitted for the management of acute appendicitis, underwent laparoscopic appendectomy, tolerated the procedure well and was tolerating diet.    The patient was seen and examined at bedside today. Pt feels better with no further complaints.  There were no acute events overnight. All relevant notes, labs & imaging were reviewed.  The case was discussed with nursing staff. Vitals and Labs are at pts baseline. All consultants involved during this admission are agreeable to discharge.    Consults:  general surgery      Disposition:   home    Instructions to Patient:      Follow up with James Mcardle, APRN - CNP in  1-2 weeks    Discharge Medications:       Medication List        START taking these medications      ciprofloxacin  500 MG tablet  Commonly known as: Cipro   Take 1 tablet by mouth 2 times daily for 7 days     metroNIDAZOLE  500 MG tablet  Commonly known as: Flagyl   Take 1 tablet by mouth 3 times daily for 7 days     ondansetron  4 MG tablet  Commonly known as: Zofran   Take every six hours as needed     oxyCODONE -acetaminophen  5-325 MG per tablet  Commonly known as: Percocet  Take 1 tablet by mouth every 6 hours as needed for Pain for up to 7 days. . Take lowest dose possible to manage pain Max Daily Amount: 4 tablets            CONTINUE taking these medications      Alcohol  Prep 70 % Pads  Check  sugar daily dx E11.9     atorvastatin  20 MG tablet  Commonly known as: LIPITOR   Take 1 tablet by mouth every other day     cholestyramine  4 g packet  Commonly known as: Questran   Take 1 packet by mouth 2 times daily     Lancets Misc  Test blood sugar once daily.     Lancing Device Misc  Check sugar daily dx E11.9     lisinopril  10 MG tablet  Commonly known as: PRINIVIL ;ZESTRIL   TAKE 1 TABLET BY MOUTH DAILY     metFORMIN  500 MG extended release tablet  Commonly known as: GLUCOPHAGE -XR  TAKE 1 TABLET BY MOUTH DAILY     ONE TOUCH ULTRA 2 w/Device Kit  Check sugar daily dx E11.9     * OneTouch Ultra strip  Generic drug: blood glucose test strips  use 1 TEST STRIP to TEST BLOOD SUGAR once daily     * blood glucose test strips strip  Commonly known as: ASCENSIA AUTODISC VI;ONE TOUCH ULTRA TEST VI  Check sugar daily dx E11.9     tiZANidine  2 MG tablet  Commonly known as: ZANAFLEX   TAKE 1 TABLET BY MOUTH EVERY 8 HOURS AS NEEDED FOR MUSCLE SPASMS           * This list has 2 medication(s) that are the same as other medications prescribed for you. Read the directions carefully, and ask your doctor or other care provider to review them with you.                ASK your doctor about these medications      busPIRone  10 MG tablet  Commonly known as: BUSPAR   Take 1 tablet by mouth 3 times daily as needed (anxiety)               Where to Get Your Medications        These medications were sent to [facility] [address] [facility] - [address] [place] [phone] - F [phone]  [address] [place], [place] [address]      Phone: [phone]   ciprofloxacin 500 MG tablet  metroNIDAZOLE 500 MG tablet  ondansetron  4 MG tablet  oxyCODONE-acetaminophen 5-325 MG per tablet         Send Copies to: James Mcardle, APRN - CNP        Signed:  Nova Began, MD   06/25/2023, 10:42 PM

## 2023-06-24 NOTE — Plan of Care (Signed)
 Problem: Chronic Conditions and Co-morbidities  Goal: Patient's chronic conditions and co-morbidity symptoms are monitored and maintained or improved  06/24/2023 1432 by Eduard Grad, RN  Outcome: Completed  Flowsheets (Taken 06/24/2023 0800)  Care Plan - Patient's Chronic Conditions and Co-Morbidity Symptoms are Monitored and Maintained or Improved: Monitor and assess patient's chronic conditions and comorbid symptoms for stability, deterioration, or improvement  06/24/2023 0307 by Brinton Canavan, RN  Outcome: Progressing  Flowsheets (Taken 06/23/2023 2000)  Care Plan - Patient's Chronic Conditions and Co-Morbidity Symptoms are Monitored and Maintained or Improved:   Monitor and assess patient's chronic conditions and comorbid symptoms for stability, deterioration, or improvement   Collaborate with multidisciplinary team to address chronic and comorbid conditions and prevent exacerbation or deterioration   Update acute care plan with appropriate goals if chronic or comorbid symptoms are exacerbated and prevent overall improvement and discharge     Problem: Discharge Planning  Goal: Discharge to home or other facility with appropriate resources  06/24/2023 1432 by Eduard Grad, RN  Outcome: Completed  Flowsheets (Taken 06/24/2023 0800)  Discharge to home or other facility with appropriate resources: Identify barriers to discharge with patient and caregiver  06/24/2023 0307 by Brinton Canavan, RN  Outcome: Progressing  Flowsheets (Taken 06/23/2023 2000)  Discharge to home or other facility with appropriate resources:   Identify barriers to discharge with patient and caregiver   Arrange for needed discharge resources and transportation as appropriate   Identify discharge learning needs (meds, wound care, etc)     Problem: Pain  Goal: Verbalizes/displays adequate comfort level or baseline comfort level  06/24/2023 1432 by Eduard Grad, RN  Outcome: Completed  06/24/2023 0307 by Brinton Canavan, RN  Outcome:  Progressing  Flowsheets (Taken 06/24/2023 0307)  Verbalizes/displays adequate comfort level or baseline comfort level:   Encourage patient to monitor pain and request assistance   Assess pain using appropriate pain scale   Administer analgesics based on type and severity of pain and evaluate response   Implement non-pharmacological measures as appropriate and evaluate response     Problem: ABCDS Injury Assessment  Goal: Absence of physical injury  06/24/2023 1432 by Eduard Grad, RN  Outcome: Completed  06/24/2023 0307 by Brinton Canavan, RN  Outcome: Progressing  Flowsheets (Taken 06/22/2023 1958 by Hazelton, Janelle, RN)  Absence of Physical Injury: Implement safety measures based on patient assessment     Problem: Safety - Adult  Goal: Free from fall injury  06/24/2023 1432 by Eduard Grad, RN  Outcome: Completed  06/24/2023 0307 by Brinton Canavan, RN  Outcome: Progressing  Flowsheets (Taken 06/24/2023 0307)  Free From Fall Injury: Instruct family/caregiver on patient safety

## 2023-06-25 NOTE — Telephone Encounter (Signed)
 Please make sure that patient follows up with her PCP related to feet swelling.  She does need to be seen at 2 weeks postop because she has a drain.  Please schedule her in the patient's if needed.

## 2023-06-25 NOTE — Care Coordination-Inpatient (Signed)
 Care Transitions Note    Initial Call - Call within 2 business days of discharge: Yes    Attempted to reach patient for transitions of care follow up. Unable to reach patient.    Outreach Attempts:   HIPAA compliant voicemail left for patient.     Patient: Marissa Sawyer    Patient DOB: [birth year]   MRN: [id number]    Reason for Admission: APPENDICITIS  Discharge Date: 06/24/23  RURS: Readmission Risk Score: 6.9    Last Discharge Facility       Date Complaint Diagnosis Description Type Department Provider    06/22/23 Abdominal Pain; Vomiting Acute appendicitis with localized peritonitis, without perforation, abscess, or gangrene ... ED to Hosp-Admission (Discharged) (ADMITTED) [facility] Nova Began, MD; Lewis, Jero...            Was this an external facility discharge? No    Follow Up Appointment:   Patient does not have a hospital follow up appointment scheduled        Future Appointments         Provider Specialty Dept Phone    07/05/2023 10:30 AM Junella Olden, [place] [facility] [phone]    07/09/2023 9:45 AM James Mcardle, APRN - CNP Family Medicine [phone]    07/12/2023 9:30 AM Willma Hartmann, APRN - CNP General Surgery [phone]    08/27/2023 1:15 PM Leeanna Puff, MD Gastroenterology [phone]    09/19/2023 11:00 AM James Mcardle, APRN - CNP Family Medicine [phone]            Plan for follow-up on next business day.      Richerd Chant, LPN

## 2023-06-25 NOTE — Telephone Encounter (Signed)
 Patient called on 06/22/23 and left a voicemail at 841 am requesting a return call back for abdominal pain and stomach issues she is having (patient did not elaborate on voicemail with symptoms). Please advise.

## 2023-06-25 NOTE — Telephone Encounter (Signed)
 Pt is moved to 07/05/23 at 11:15.

## 2023-06-25 NOTE — Telephone Encounter (Signed)
 The Marissa Sawyer had a lap appy done on 06/22/2023. The Marissa Sawyer does have a drain tube placed. I called the Marissa Sawyer to schedule her a post - op visit. The Marissa Sawyer is scheduled on 07/12/2023 @ 9:30 with Ernestina Headland, APRN-CNP. Would you like the Marissa Sawyer to be seen sooner?   The Marissa Sawyer informed me that she noticed yesterday (06/24/23) that both of her feet are swollen. She is able to walk and has been elevating both of her feet. She denies any redness, warmth, and shortness of breath.

## 2023-06-26 NOTE — Care Coordination-Inpatient (Signed)
 Care Transitions Note    Initial Call - Call within 2 business days of discharge: Yes    Patient Current Location:  Home: [address]  [place] [address]    LPN Care Coordinator contacted the patient by telephone to perform post hospital discharge assessment, verified name and DOB as identifiers.  Provided introduction to self, and explanation of the Care Transition Nurse role.    Patient: Marissa Sawyer    Patient DOB: 05/29/1950   MRN: [id number]    Reason for Admission: appendicitis  Discharge Date: 06/24/23  RURS: Readmission Risk Score: 6.9      Last Discharge Facility       Date Complaint Diagnosis Description Type Department Provider    06/22/23 Abdominal Pain; Vomiting Acute appendicitis with localized peritonitis, without perforation, abscess, or gangrene ... ED to Hosp-Admission (Discharged) (ADMITTED) [facility] Nova Began, MD; Lewis, Jero...            Was this an external facility discharge? No    Additional needs identified to be addressed with provider   No needs identified             Method of communication with provider: none.    Patients top risk factors for readmission: medical condition-     Interventions to address risk factors:   Review of patient management of conditions/medications:      Care Summary Note: Writer spoke with [facility] for her initial care transitions call. She states she is doing okay-she was able to get a good nights sleep last night. Her pain has been minimal-she took half a pain pill last night. She reports she is up and moving around the house without difficulty. Medications reviewed-she has been having diarrhea but states this is common for her. Advised to try probiotics or yogurt. She states her incisions look good and she is not having any redness or swelling. She has a JP drain in place-she reports there is a small amount of serosanguinous drainage in the bulb.  She has scheduled her PCP and surgery follow ups and denies having any other needs or concerns at this time.      LPN Care Coordinator reviewed discharge instructions with patient. The patient was given an opportunity to ask questions; all questions answered at this time.. The patient verbalized understanding.   Were discharge instructions available to patient? Yes.   Reviewed appropriate site of care based on symptoms and resources available to patient including: PCP  Specialist  MyChart Messaging. The patient agrees to contact the primary care provider and/or specialist office for questions related to their healthcare.      Advance Care Planning:   Does patient have an Advance Directive: deferred at this time, will discuss on future follow up. .    Medication Reconciliation:  Medication reconciliation was performed with patient,1111F entered: N/A.     Remote Patient Monitoring:  Offered patient enrollment in the Remote Patient Monitoring (RPM) program for in-home monitoring: Yes, but did not enroll at this time:   .    Assessments:  Care Transitions 24 Hour Call    Care Transitions Interventions          Follow Up Appointment:   Discussed follow up appointments. Patient has hospital follow up appointment scheduled within 14 days of discharge.   Future Appointments         Provider Specialty Dept Phone    07/05/2023 10:30 AM Junella Olden, [place] [facility] [phone]    07/05/2023  11:15 AM Willma Hartmann, APRN - CNP General Surgery [phone]    07/09/2023 9:45 AM James Mcardle, APRN - CNP Family Medicine [phone]    08/27/2023 1:15 PM Leeanna Puff, MD Gastroenterology [phone]    09/19/2023 11:00 AM James Mcardle, APRN - CNP Family Medicine [phone]            LPN Care Coordinator provided contact information.  Plan for follow-up call in 6-10 days based on severity of symptoms and risk factors.  Plan for next call: symptom management-how is pain and incisions? Any issues with JP drain?   follow-up appointment-review notes from appt  medication management-completed antibiotics?       Richerd Chant, LPN

## 2023-06-26 NOTE — Telephone Encounter (Signed)
 Writer called patient to schedule OV - no longer needs appt - patient had appendix  removed

## 2023-06-26 NOTE — Telephone Encounter (Signed)
 Please schedule patient an office visit, thanks!

## 2023-06-27 LAB — SURGICAL PATHOLOGY REPORT

## 2023-06-27 NOTE — Telephone Encounter (Signed)
-----   Message from Leziel T sent at 06/27/2023 12:15 PM EDT -----  Regarding: [facility] Appointment Request  [facility] Appointment Request    Patient needs appointment for [facility] Appointment Type: Hospital Follow Up.    Patient Requested Dates(s): any day  Patient Requested Time: not early morning  Provider Name: James Mcardle, APRN - CNP    Reason for Appointment Request: Established Patient - No appointments available during search  --------------------------------------------------------------------------------------------------------------------------    Relationship to Patient: Self     Call Back Information: OK to leave message on voicemail  Preferred Call Back Number: Phone [phone] (home)        Patient want to schedule an appointment after surgery with her PCP.

## 2023-06-27 NOTE — Telephone Encounter (Signed)
 Spoke to pt and scheduled for TCM visit for tomorrow with Dr Verdia Glad.

## 2023-06-27 NOTE — Care Coordination-Inpatient (Signed)
 Care Transitions Note    Follow Up Call     Patient Current Location:  Home: [address]  [place] [address]    LPN Care Coordinator contacted the patient by telephone. Verified name and DOB as identifiers.    Additional needs identified to be addressed with provider   No needs identified                 Method of communication with provider: none.    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 Advised that this would be okay. She also has questions about her incision. She attempted to remove her dressing last night and reports it is stuck to her steri strips and she is uncomfortable removing this on her own. She reports she will have PCP remove it tomorrow at her follow up. She states her diarrhea and appetite are both improving today. She denies having any other needs or concerns and is agreeable to another follow up call next week.     Plan of care updates since last contact:  Review of patient management of conditions/medications:         Advance Care Planning:   Does patient have an Advance Directive:  not on file.  .    Medication Review:  No changes since last call.     Remote Patient Monitoring:  Offered patient enrollment in the Remote Patient Monitoring (RPM) program for in-home monitoring: Yes, but did not enroll at this time:   .    Assessments:  Care Transitions Subsequent and Final Call    Subsequent and Final Calls  Care Transitions Interventions  Other Interventions:              Follow Up Appointment:   Reviewed upcoming appointment(s).  Future Appointments         Provider Specialty Dept Phone    06/28/2023 10:40 AM Vickki Grandchild, MD Family Medicine [phone]    07/05/2023 10:30 AM Junella Olden, DPM Podiatry [phone]    07/05/2023 11:15 AM Willma Hartmann, APRN - CNP General Surgery [phone]    07/09/2023 9:45 AM James Mcardle, APRN - CNP Family Medicine  [phone]    08/27/2023 1:15 PM Leeanna Puff, MD Gastroenterology [phone]    09/19/2023 11:00 AM James Mcardle, APRN - CNP Family Medicine [phone]            LPN Care Coordinator provided contact information.  Plan for follow-up call in 6-10 days based on severity of symptoms and risk factors.  Plan for next call: symptom management-how is diarrhea and incisions? Any abdominal pain?   follow-up appointment-review notes from PCP follow up.   medication management-completed ATB?       Richerd Chant, LPN

## 2023-06-28 ENCOUNTER — Ambulatory Visit
Admit: 2023-06-28 | Discharge: 2023-06-28 | Payer: Medicare (Managed Care) | Attending: Family Medicine | Primary: Family

## 2023-06-28 DIAGNOSIS — Z09 Encounter for follow-up examination after completed treatment for conditions other than malignant neoplasm: Secondary | ICD-10-CM

## 2023-06-28 NOTE — Patient Instructions (Signed)
 Thank you for choosing [facility].  We know you have options when it comes to your healthcare; we appreciate that you chose [place]. Our goal is to provide exceptional  service and world class care to every patient.  You will be receiving a survey via email or text message asking for your feedback.  Please take a few minutes to share your thoughts about your recent visit. Your comments help [place] understand what we do well and ways we can improve.  Thank you in advance for your valuable feedback.       The medication list included in this document is our record of what you are currently taking, including any changes that were made at today's visit.  If you find any differences when compared to your medications at home, or have any questions that were not answered at your visit, please contact the office.

## 2023-06-28 NOTE — Progress Notes (Signed)
 Post-Discharge Transitional Care Follow Up      [address]   Date of Birth:  30-Jul-1950    Date of Office Visit:  06/28/2023  Date of Hospital Admission: 06/22/23  Date of Hospital Discharge: 06/24/23  Readmission Risk Score (high >=14%. Medium >=10%):Readmission Risk Score: 6.9      Care management risk score Rising risk (score 2-5) and Complex Care (Scores >=6): No Risk Score On File     Non face to face  following discharge, date last encounter closed (first attempt may have been earlier): 06/26/2023     Call initiated 2 business days of discharge: Yes     Hospital discharge follow-up  -     PR DISCHARGE MEDS RECONCILED W/ CURRENT OUTPATIENT MED LIST  Acute appendicitis, unspecified acute appendicitis type    Recovering well.     Medical Decision Making: moderate complexity  No follow-ups on file.           Subjective:   HPI    Inpatient course: Discharge summary reviewed- see chart. " The patient was admitted for the management of acute appendicitis, underwent laparoscopic appendectomy, tolerated the procedure well and was tolerating diet. "    Interval history/Current status: edema that she called surgery about has resolved. Still has a JP drain with red serosanguinous drainage.     Patient Active Problem List   Diagnosis    Hyperlipidemia    HTN (hypertension)    OA (osteoarthritis)    Anxiety    Tubular adenoma    Dermatophytosis of nail    Benign neoplasm of skin    Diverticulosis of colon    Chronic GERD    History of gastric polyp    Controlled type 2 diabetes mellitus without complication, without long-term current use of insulin  (HCC)    Acute appendicitis       Medications listed as ordered at the time of discharge from hospital     Medication List            Accurate as of June 28, 2023 11:13 AM. If you have any questions, ask your nurse or doctor.                CONTINUE taking these medications      Alcohol  Prep 70 % Pads  Check sugar daily dx E11.9     atorvastatin  20 MG tablet  Commonly known as:  LIPITOR   Take 1 tablet by mouth every other day     busPIRone  10 MG tablet  Commonly known as: BUSPAR   Take 1 tablet by mouth 3 times daily as needed (anxiety)     cholestyramine  4 g packet  Commonly known as: Questran   Take 1 packet by mouth 2 times daily     ciprofloxacin 500 MG tablet  Commonly known as: Cipro  Take 1 tablet by mouth 2 times daily for 7 days     Lancets Misc  Test blood sugar once daily.     Lancing Device Misc  Check sugar daily dx E11.9     lisinopril  10 MG tablet  Commonly known as: PRINIVIL ;ZESTRIL   TAKE 1 TABLET BY MOUTH DAILY     metFORMIN  500 MG extended release tablet  Commonly known as: GLUCOPHAGE -XR  TAKE 1 TABLET BY MOUTH DAILY     metroNIDAZOLE 500 MG tablet  Commonly known as: Flagyl  Take 1 tablet by mouth 3 times daily for 7 days     ondansetron  4 MG tablet  Commonly known as:  Zofran   Take every six hours as needed     ONE TOUCH ULTRA 2 w/Device Kit  Check sugar daily dx E11.9     * OneTouch Ultra strip  Generic drug: blood glucose test strips  use 1 TEST STRIP to TEST BLOOD SUGAR once daily     * blood glucose test strips strip  Commonly known as: ASCENSIA AUTODISC VI;ONE TOUCH ULTRA TEST VI  Check sugar daily dx E11.9     oxyCODONE-acetaminophen 5-325 MG per tablet  Commonly known as: Percocet  Take 1 tablet by mouth every 6 hours as needed for Pain for up to 7 days. . Take lowest dose possible to manage pain Max Daily Amount: 4 tablets     tiZANidine  2 MG tablet  Commonly known as: ZANAFLEX   TAKE 1 TABLET BY MOUTH EVERY 8 HOURS AS NEEDED FOR MUSCLE SPASMS           * This list has 2 medication(s) that are the same as other medications prescribed for you. Read the directions carefully, and ask your doctor or other care provider to review them with you.                   Medications marked "taking" at this time  Outpatient Medications Marked as Taking for the 06/28/23 encounter (Office Visit) with Vickki Grandchild, MD   Medication Sig Dispense Refill    ciprofloxacin (CIPRO)  500 MG tablet Take 1 tablet by mouth 2 times daily for 7 days 14 tablet 0    metroNIDAZOLE (FLAGYL) 500 MG tablet Take 1 tablet by mouth 3 times daily for 7 days 21 tablet 0    ondansetron  (ZOFRAN ) 4 MG tablet Take every six hours as needed 20 tablet 0    oxyCODONE-acetaminophen (PERCOCET) 5-325 MG per tablet Take 1 tablet by mouth every 6 hours as needed for Pain for up to 7 days. . Take lowest dose possible to manage pain Max Daily Amount: 4 tablets 28 tablet 0    metFORMIN  (GLUCOPHAGE -XR) 500 MG extended release tablet TAKE 1 TABLET BY MOUTH DAILY 90 tablet 1    lisinopril  (PRINIVIL ;ZESTRIL ) 10 MG tablet TAKE 1 TABLET BY MOUTH DAILY 90 tablet 1    tiZANidine  (ZANAFLEX ) 2 MG tablet TAKE 1 TABLET BY MOUTH EVERY 8 HOURS AS NEEDED FOR MUSCLE SPASMS 30 tablet 0    Alcohol  Swabs (ALCOHOL  PREP) 70 % PADS Check sugar daily dx E11.9 100 each 11    Lancet Devices (LANCING DEVICE) MISC Check sugar daily dx E11.9 1 each 0    Blood Glucose Monitoring Suppl (ONE TOUCH ULTRA 2) w/Device KIT Check sugar daily dx E11.9 1 kit 0    blood glucose test strips (ASCENSIA AUTODISC VI;ONE TOUCH ULTRA TEST VI) strip Check sugar daily dx E11.9 100 strip 11    atorvastatin  (LIPITOR ) 20 MG tablet Take 1 tablet by mouth every other day 45 tablet 3    ONETOUCH ULTRA strip use 1 TEST STRIP to TEST BLOOD SUGAR once daily 100 strip 3    Lancets MISC Test blood sugar once daily. 100 each 1        Medications patient taking as of now reconciled against medications ordered at time of hospital discharge: Yes    Review of Systems   Genitourinary:  Positive for frequency.       Objective:    BP (!) 102/56   Pulse 65   Ht 1.626 m (5\' 4" )   Wt 67.6 kg (149 lb)  SpO2 96%   BMI 25.58 kg/m   Physical Exam  Vitals and nursing note reviewed.   Constitutional:       Appearance: Normal appearance. She is normal weight.   Abdominal:      Comments: JP drain dressing removed, no erythema on surrounding skin. Right flank ecchymosis noted.    Neurological:       Mental Status: She is alert.         An electronic signature was used to authenticate this note.  --Vickki Grandchild, MD

## 2023-07-02 MED ORDER — FLUCONAZOLE 150 MG PO TABS
150 | ORAL_TABLET | ORAL | 0 refills | 6.00000 days | Status: AC
Start: 2023-07-02 — End: 2023-07-08

## 2023-07-02 NOTE — Telephone Encounter (Signed)
 Pt called into the office stating she is experiencing vaginal itching and urination frequency since being on the antibiotic. She would like to know if you could send her in a prescription for this to [facility] in [place].

## 2023-07-04 NOTE — Telephone Encounter (Signed)
Pt started medication

## 2023-07-05 ENCOUNTER — Ambulatory Visit: Admit: 2023-07-05 | Discharge: 2023-07-05 | Payer: MEDICARE | Attending: Foot & Ankle Surgery | Primary: Family

## 2023-07-05 ENCOUNTER — Ambulatory Visit
Admit: 2023-07-05 | Discharge: 2023-07-05 | Payer: Medicare (Managed Care) | Attending: Registered Nurse | Primary: Family

## 2023-07-05 VITALS — BP 129/65 | HR 67 | Temp 97.30000°F | Ht 64.0 in | Wt 149.3 lb

## 2023-07-05 DIAGNOSIS — B351 Tinea unguium: Secondary | ICD-10-CM

## 2023-07-05 DIAGNOSIS — K3580 Unspecified acute appendicitis: Secondary | ICD-10-CM

## 2023-07-05 NOTE — Telephone Encounter (Signed)
 Patient called into the office stating that she has been have frequent loose BM (not liquid) and her rectum is sore and irritated. Patient did not know what she should do about it.    I gave patient information about otc treatment for this and sent it through mychart to refer back to. Patient verbalized understanding.

## 2023-07-05 NOTE — Progress Notes (Signed)
 SUBJECTIVE: Marissa Sawyer is a 73 y.o. female who returns to the office with chief complaint of painful fungal toenails. Patient relates toe nails are thickened/difficult to trim as well as painful with ambulation and with shoe gear.   Chief Complaint   Patient presents with    Nail Problem     B/l nail trim, last seen Dr. Verdia Glad 06/28/23    Diabetes     Last blood sugar 135     Review of Systems   Constitutional:  Negative for activity change, appetite change, chills, diaphoresis, fatigue and fever.   Respiratory:  Negative for shortness of breath.    Cardiovascular:  Negative for leg swelling.   Gastrointestinal:  Negative for diarrhea and nausea.   Endocrine: Negative for cold intolerance, heat intolerance and polyuria.   Musculoskeletal:  Positive for arthralgias. Negative for back pain, gait problem, joint swelling and myalgias.   Skin:  Negative for color change, pallor, rash and wound.   Allergic/Immunologic: Negative for environmental allergies and food allergies.   Neurological:  Negative for dizziness, weakness, light-headedness and numbness.   Hematological:  Does not bruise/bleed easily.   Psychiatric/Behavioral:  Negative for behavioral problems, confusion and self-injury. The patient is not nervous/anxious.      OBJECTIVE: Clinical evaluation of patient reveals nails 1,2,3,4,5 of the right foot and nails 1,2,3,4,5 of the left foot to present with thickness, elongation, discoloration, brittleness, and subungual debris. There was pain with palpation and debridement of the toenails of the bilateral feet. No open lesions noted to either foot today.   The right DP pulse is not palpable.   The left DP pulse is not palpable.   The right PT pulse is not palpable.   The left PT pulse is not palpable.   Protective sensation is present to the right plantar foot as noted with a 5.07 Semmes-Weinstein monofilament.   Protective sensation is present to the left plantar foot as noted with a 5.07 Semmes-Weinstein  monofilament.   Glucose: 135 mg/dl.    Class A Findings (1 needed)   []  Non-traumatic amputation of foot or integral skeleton portion thereof.   []  Q7.      Class B Findings (2 needed)   1. [x]  Absent posterior tibial pulse   2. [x]  Absent dorsalis pedis pulse   3. []  Advanced trophic changes; three of the following are required:            []  hair growth (decrease or absence)            []  nail changes (thickening)            []  pigmentary changes (discoloration)            []  skin texture (thin, shiny)            []  skin color (rubor or redness)   [x]  Q8.      Class C Findings (1 Class B, 2 Class C needed)   1. []  Claudication   2. []  Temperature changes   3. []  Edema   4. []  Paresthesia   5. []  Burning   []  Q9.     ASSESSMENT:    Diagnosis Orders   1. Onychomycosis of toenail  DEBRIDEMENT OF NAILS, 6 OR MORE    HM DIABETES FOOT EXAM      2. Pain of toes of both feet  DEBRIDEMENT OF NAILS, 6 OR MORE    HM DIABETES FOOT EXAM      3. Type  2 diabetes mellitus with peripheral vascular disease (HCC)  DEBRIDEMENT OF NAILS, 6 OR MORE    HM DIABETES FOOT EXAM        PLAN: Toenails 1,2,3,4,5 of the right foot and 1,2,3,4,5 of the left foot were debrided in length and thickness using a nail nipper and a grinder. Return in about 9 weeks (around 09/06/2023) for At risk diabetic foot care.   07/05/2023      Dorse Locy E Angle Karel, DPM
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
## 2023-07-05 NOTE — Patient Instructions (Addendum)
 Change dressing over previous drain site once daily for the next few days, then no dressing necessary.  Slowly advance to normal activity.  Regular diet.

## 2023-07-05 NOTE — Care Coordination-Inpatient (Signed)
 Care Transitions Note    Follow Up Call     Attempted to reach patient for transitions of care follow up.  Unable to reach patient.      Outreach Attempts:   HIPAA compliant voicemail left for patient.     Care Summary Note: 1st attempt-noted patient has podiatry and surgery appointments today.     Follow Up Appointment:   Future Appointments         Provider Specialty Dept Phone    07/05/2023 10:30 AM Junella Olden, [place] [facility] [phone]    07/05/2023 11:15 AM Willma Hartmann, APRN - CNP General Surgery [phone]    07/09/2023 9:45 AM James Mcardle, APRN - CNP Family Medicine [phone]    07/17/2023 12:00 PM Beam, Burnell Carry, APRN - CNP Obstetrics and Gynecology [phone]    08/27/2023 1:15 PM Leeanna Puff, MD Gastroenterology [phone]    09/19/2023 11:00 AM James Mcardle, APRN - CNP Family Medicine [phone]            Plan for follow-up on next business day.  based on severity of symptoms and risk factors. Plan for next call: symptom management-how is diarrhea and incisions? Any abdominal pain?   follow-up appointment-review notes from PCP follow up.   medication management-completed ATB?       Richerd Chant, LPN

## 2023-07-06 ENCOUNTER — Telehealth

## 2023-07-06 MED ORDER — DIBUCAINE (PERIANAL) 1 % EX OINT
1 | Freq: Four times a day (QID) | CUTANEOUS | 0 refills | Status: AC | PRN
Start: 2023-07-06 — End: ?

## 2023-07-06 NOTE — Telephone Encounter (Signed)
 Please asked patient if bleeding seems to be coming from rectum or her urethra with urination?  Or vaginal?

## 2023-07-06 NOTE — Telephone Encounter (Signed)
 Pt states she had her drain tube removed on 07/05/23. Pt states she had soft movements since she had her drain removed. Pt states she has noticed bright red blood when she wipes. Pt denies blood with her stool.  Pt states she has to wear a pad because she has bowel incontinence. Pt states it is not a lot but she has some that comes out. Pt did note she is raw and is using Desitin. Pt  denies any pain.   Please advise

## 2023-07-06 NOTE — Telephone Encounter (Signed)
 Patient did not answer. VML to check Mychart for message.

## 2023-07-06 NOTE — Care Coordination-Inpatient (Signed)
 Care Transitions Note    Follow Up Call     Patient Current Location:  Home: [address]  [place] [address]    LPN Care Coordinator contacted the patient by telephone. Verified name and DOB as identifiers.    Additional needs identified to be addressed with provider   No needs identified                 Method of communication with provider: none.    Care Summary Note: Writer spoke with [facility] for a follow up care transitions call. She states she is doing okay. She had her surgery follow up yesterday and had her JP drain removed. She states she has the drain sire covered but there is no drainage that she can see-she will change the dressing today. Her incisions are healing well without s/s of infection and she is not having any abdominal pain. She does have loose stools which is chronic for her. She thanked Clinical research associate for calling and denied having any other needs or concerns.     Plan of care updates since last contact:  Review of patient management of conditions/medications:         Advance Care Planning:   Does patient have an Advance Directive: reviewed during previous call, see note. .    Medication Review:  No changes since last call.     Remote Patient Monitoring:  Offered patient enrollment in the Remote Patient Monitoring (RPM) program for in-home monitoring: Yes, but did not enroll at this time: declined to enroll in the program becausenot interested.  .    Assessments:  Care Transitions Subsequent and Final Call    Subsequent and Final Calls  Do you have any ongoing symptoms?: No  Have your medications changed?: No  Do you have any questions related to your medications?: No  Do you currently have any active services?: No  Do you have any needs or concerns that I can assist you with?: No  Identified Barriers: None  Care Transitions Interventions  Other Interventions:              Follow Up Appointment:   Reviewed upcoming appointment(s). and TOC appointment attended as scheduled   Future Appointments          Provider Specialty Dept Phone    07/09/2023 9:45 AM James Mcardle, APRN - CNP Family Medicine [phone]    07/17/2023 12:00 PM Beam, Burnell Carry, APRN - CNP Obstetrics and Gynecology [phone]    08/27/2023 1:15 PM Leeanna Puff, MD Gastroenterology [phone]    09/13/2023 10:30 AM Junella Olden, DPM Podiatry [phone]    09/19/2023 11:00 AM James Mcardle, APRN - CNP Family Medicine [phone]    10/03/2023 10:15 AM Keane Passe, MD General Surgery [phone]            LPN Care Coordinator provided contact information.  Plan for follow-up call in 6-10 days based on severity of symptoms and risk factors.  Plan for next call: symptom management-any issues since having drain removed? How are incisions and loose stools? Final call if doing okay?       Richerd Chant, LPN

## 2023-07-07 ENCOUNTER — Inpatient Hospital Stay: Payer: Medicare (Managed Care) | Primary: Family

## 2023-07-07 ENCOUNTER — Inpatient Hospital Stay: Admit: 2023-07-07 | Payer: Medicare (Managed Care) | Primary: Family

## 2023-07-07 DIAGNOSIS — K625 Hemorrhage of anus and rectum: Secondary | ICD-10-CM

## 2023-07-07 LAB — CBC WITH AUTO DIFFERENTIAL
Basophils %: 1 % (ref 0–2)
Basophils Absolute: 0 10*3/uL (ref 0.0–0.2)
Eosinophils %: 3 % (ref 0–4)
Eosinophils Absolute: 0.2 10*3/uL (ref 0.0–0.4)
Hematocrit: 36.4 % (ref 36–46)
Hemoglobin: 12.3 g/dL (ref 12.0–16.0)
Lymphocytes %: 28 % (ref 24–44)
Lymphocytes Absolute: 1.8 10*3/uL (ref 1.0–4.8)
MCH: 29.7 pg (ref 26–34)
MCHC: 33.7 g/dL (ref 31–37)
MCV: 88 fL (ref 80–100)
MPV: 8.3 fL (ref 6.0–12.0)
Monocytes %: 9 % — ABNORMAL HIGH (ref 1–7)
Monocytes Absolute: 0.6 10*3/uL (ref 0.1–1.3)
Neutrophils %: 59 % (ref 36–66)
Neutrophils Absolute: 3.8 10*3/uL (ref 1.3–9.1)
Platelets: 347 10*3/uL (ref 150–450)
RBC: 4.13 m/uL (ref 4.0–5.2)
RDW: 13.6 % (ref 11.5–14.9)
WBC: 6.4 10*3/uL (ref 3.5–11.0)

## 2023-07-09 ENCOUNTER — Encounter: Payer: MEDICARE | Attending: Family | Primary: Family

## 2023-07-09 DIAGNOSIS — E119 Type 2 diabetes mellitus without complications: Secondary | ICD-10-CM

## 2023-07-09 NOTE — Progress Notes (Unsigned)
 Pt in office to discuss diabetes monitors and insurance

## 2023-07-10 MED ORDER — LISINOPRIL 10 MG PO TABS
10 | ORAL_TABLET | Freq: Every day | ORAL | 1 refills | 90.00000 days | Status: DC
Start: 2023-07-10 — End: 2024-04-02

## 2023-07-10 NOTE — Telephone Encounter (Signed)
 Next Visit Date:  Future Appointments   Date Time Provider Department Center   07/17/2023 12:00 PM Beam, Burnell Carry, APRN - CNP [facility]   08/27/2023  1:15 PM Leeanna Puff, MD [place]  GI [facility]   09/13/2023 10:30 AM Junella Olden, DPM [place]  Pod [facility]   09/19/2023 11:00 AM James Mcardle, APRN - CNP [place] [facility]   10/03/2023 10:15 AM Keane Passe, MD [facility]       Last Visit Date:  03/21/2023  Last Fill:  01/11/23

## 2023-07-12 ENCOUNTER — Encounter: Payer: Medicare (Managed Care) | Attending: Registered Nurse | Primary: Family

## 2023-07-13 NOTE — Care Coordination-Inpatient (Signed)
 Care Transitions Note    Follow Up Call     Patient Current Location:  Home: [address] [place] [address]  [place] [address]    Care Transition Nurse contacted the patient by telephone. Verified name and DOB as identifiers.    Additional needs identified to be addressed with provider   No needs identified                 Method of communication with provider: none.    Care Summary Note: Spoke with patient for transitional follow up. Patient being followed after having appendectomy, was discharged to home with drain in place. She had her follow up with PCP and was seen by surgeon as well, had drain removed at surgeon's follow up visit.  She is currently out at the store during time of call. She said she is doing well but was complaining of rectal discomfort.  She had let her surgeon know, they did order some Dibucaine for her and she has started to use a sitz bath.  She still has some uncomfortable burning/itching.  Expressed she could try some witch hazel soaked pads to apply to area that may provide some additional comfort.  Reports drain removal was painful, had some pain to her abdomen the other day which subsided but has had none since.  She was treated for yeast infection, states no longer having issues with this. She denies any new needs or concerns at this time.     Plan of care updates since last contact:  None       Advance Care Planning:   Does patient have an Advance Directive: reviewed during previous call, see note. .    Medication Review:  Medications changed since last call, reviewed today.     Remote Patient Monitoring:  Offered patient enrollment in the Remote Patient Monitoring (RPM) program for in-home monitoring: Patient is not eligible for RPM program because: patient does not have qualifying diagnosis.    Assessments:  Care Transitions Subsequent and Final Call    Schedule Follow Up Appointment with PCP: Completed  Subsequent and Final Calls  Do you have any ongoing symptoms?: Yes  Onset of Patient-reported  symptoms: In the past 7 days  Patient-reported symptoms: Other, Pain  Have your medications changed?: Yes  Patient Reports: Dibucaine ordered at surgeons office  Do you have any questions related to your medications?: No  Do you currently have any active services?: No  Do you have any needs or concerns that I can assist you with?: No  Identified Barriers: Lack of Education  Care Transitions Interventions  Other Interventions:              Follow Up Appointment:   Reviewed upcoming appointment(s).  Future Appointments         Provider Specialty Dept Phone    07/17/2023 12:00 PM Beam, Burnell Carry APRN - CNP Obstetrics and Gynecology [phone]    08/27/2023 1:15 PM Leeanna Puff, MD Gastroenterology [phone]    09/13/2023 10:30 AM Junella Olden, DPM Podiatry [phone]    09/19/2023 11:00 AM James Mcardle, APRN - CNP Family Medicine [phone]    10/03/2023 10:15 AM Keane Passe, MD General Surgery [phone]            Care Transition Nurse provided contact information.  Plan for follow-up call in 6-10 days based on severity of symptoms and risk factors.  Plan for next call:  Any s/s of infection? How is rectal pain? Any new issues? Will be  final outreach.       Adelia Adolphus, RN

## 2023-07-17 ENCOUNTER — Ambulatory Visit: Admit: 2023-07-17 | Discharge: 2023-07-17 | Payer: Medicare (Managed Care) | Attending: Family | Primary: Family

## 2023-07-17 VITALS — BP 118/58 | Ht 64.0 in | Wt 147.0 lb

## 2023-07-17 DIAGNOSIS — Z01419 Encounter for gynecological examination (general) (routine) without abnormal findings: Secondary | ICD-10-CM
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
## 2023-07-18 NOTE — Care Coordination-Inpatient (Signed)
 Care Transitions Note    Follow Up Call     Patient Current Location:  Home: [address]  [place] [address]    Care Transition Nurse contacted the patient by telephone. Verified name and DOB as identifiers.    Additional needs identified to be addressed with provider   No needs identified                 Method of communication with provider: none.    Care Summary Note: Spoke with patient for transitional follow up. She had appendectomy, has had drain removed, incisions are healing and area where drain removed has scabbed over, she has been putting ATB ointment to area still.  Abdomen is feeling ok, has occasional pain if moving certain ways or bending but nothing severe.  Last week she was having issues with hemorrhoids, had advised sitz bath and using witch hazel for some relief, she is also on Anusol and this week reports they have greatly improved.  She denies any new needs or concerns at this time.     Plan of care updates since last contact:  None       Advance Care Planning:   Does patient have an Advance Directive: not on file; education provided -   .    Medication Review:  No changes since last call.     Remote Patient Monitoring:  Offered patient enrollment in the Remote Patient Monitoring (RPM) program for in-home monitoring: Patient is not eligible for RPM program because: patient does not have qualifying diagnosis.    Assessments:  Care Transitions Subsequent and Final Call    Schedule Follow Up Appointment with PCP: Completed  Subsequent and Final Calls  Do you have any ongoing symptoms?: Yes  Onset of Patient-reported symptoms: In the past 7 days  Patient-reported symptoms: Abdominal Pain  Have your medications changed?: No  Do you have any questions related to your medications?: No  Do you currently have any active services?: No  Do you have any needs or concerns that I can assist you with?: No  Identified Barriers: Lack of Education  Care Transitions Interventions  Other Interventions:               Follow Up Appointment:   Reviewed upcoming appointment(s).  Future Appointments         Provider Specialty Dept Phone    08/27/2023 1:15 PM Leeanna Puff, MD Gastroenterology [phone]    09/13/2023 10:30 AM Junella Olden, DPM Podiatry [phone]    09/19/2023 11:00 AM James Mcardle, APRN - CNP Family Medicine [phone]    10/03/2023 10:15 AM Keane Passe, MD General Surgery [phone]            Care Transition Nurse provided contact information.  Plan for follow-up call in 6-10 days based on severity of symptoms and risk factors.  Plan for next call:  any new abdominal pain? Any further issues with hemorrhoids? Can be final call.       Adelia Adolphus, RN

## 2023-07-24 NOTE — Care Coordination-Inpatient (Signed)
 Care Transitions Note    Final Call     Patient Current Location:  Home: [address]  [place] [address]    Care Transition Nurse contacted the patient by telephone. Verified name and DOB as identifiers.    Patient graduated from the Care Transitions program on 5/20.  Patient/family has the ability to self-manage at this time..      Advance Care Planning:   Does patient have an Advance Directive: reviewed during previous call, see note. .    Handoff:   Patient/family agreeable to [facility] outreach.      Care Summary Note: Spoke with patient for final outreach after admission for acute appendicitis, ultimately having appendectomy.  Her incisions are healed now, states she gets a pain every now and then to umbilical puncture site when moving in certain positions but has not had any pain, f/c, n/v or other problems.  She did have some issues with inflamed hemorrhoids, was using Anusol to area and had gotten a sitz bath and witch hazel to use which was very beneficial, no longer flared up as before and feeling much improved.  Expressed this was final outreach today for care transitions, discussed if she had any ongoing needs.  She did say that she would like to work with someone on her diabetes, states she has issues with sugars at time spiking and would like further education with managing her sugars.  I expressed I can refer to [facility] to see if they can assist her with her chronic issues with DM, also has IBS.  She denies any further acute needs at this time, will send off referral to Marylu Soda for her PCP office.     Assessments:  Care Transitions Subsequent and Final Call    Schedule Follow Up Appointment with PCP: Completed  Subsequent and Final Calls  Do you have any ongoing symptoms?: No  Have your medications changed?: No  Do you have any questions related to your medications?: No  Do you currently have any active services?: No  Do you have any needs or concerns that I can assist you with?: No  Identified Barriers:  Lack of Support, Lack of Education  Care Transitions Interventions  Other Interventions:              Upcoming Appointments:    Future Appointments         Provider Specialty Dept Phone    08/27/2023 1:15 PM Leeanna Puff, MD Gastroenterology [phone]    09/13/2023 10:30 AM Junella Olden, DPM Podiatry [phone]    09/19/2023 11:00 AM James Mcardle, APRN - CNP Family Medicine [phone]    10/03/2023 10:15 AM Keane Passe, MD General Surgery [phone]            Patient has agreed to contact primary care provider and/or specialist for any further questions, concerns, or needs.    Adelia Adolphus, RN

## 2023-08-01 NOTE — Care Coordination-Inpatient (Signed)
 Ambulatory Care Coordination Note     08/01/2023 3:00 PM     ACM outreach attempt by this ACM today to offer care management services. ACM was unable to reach the patient by telephone today;   left voice message requesting a return phone call to this ACM.     ACM: Darrel Elm, RN     Care Summary Note: CTN referral, introduction letter mailed.     PCP/Specialist follow up:   Future Appointments         Provider Specialty Dept Phone    08/27/2023 1:15 PM Leeanna Puff, MD Gastroenterology [phone]    09/13/2023 10:30 AM Junella Olden, DPM Podiatry [phone]    09/19/2023 11:00 AM James Mcardle, APRN - CNP Family Medicine [phone]    10/03/2023 10:15 AM Keane Passe, MD General Surgery [phone]    10/16/2023 11:00 AM [facility] DIGITAL RM Radiology [phone]    10/16/2023 11:30 AM [facility] DEXA RM Radiology [phone]            Follow Up:   Plan for next ACM outreach in approximately 1 week to complete:  - outreach attempt to offer care management services.

## 2023-08-08 NOTE — Care Coordination-Inpatient (Signed)
 Ambulatory Care Coordination Note     08/08/2023 3:43 PM     ACM outreach attempt by this ACM today to offer care management services. ACM was unable to reach the patient by telephone today;   left voice message requesting a return phone call to this ACM.     ACM: Darrel Elm, RN     Care Summary Note: CTN referral, introduction letter mailed previously.     PCP/Specialist follow up:   Future Appointments         Provider Specialty Dept Phone    08/27/2023 1:15 PM Leeanna Puff, MD Gastroenterology [phone]    09/13/2023 10:30 AM Junella Olden, DPM Podiatry [phone]    09/19/2023 11:00 AM James Mcardle, APRN - CNP Family Medicine [phone]    10/03/2023 10:15 AM Keane Passe, MD General Surgery [phone]    10/16/2023 11:00 AM [facility] Radiology [phone]    10/16/2023 11:30 AM [facility] DEXA RM Radiology [phone]            Follow Up:   Plan for next ACM outreach in approximately 1 week to complete:  - outreach attempt to offer care management services.

## 2023-08-15 NOTE — Care Coordination-Inpatient (Signed)
 Ambulatory Care Coordination Note     08/15/2023 3:22 PM     ACM outreach attempt by this ACM today to offer care management services. ACM was unable to reach the patient by telephone today;   No answer, no VM picked up.      ACM: Darrel Elm, RN     Care Summary Note: CTN referral, introduction letter mailed previously. Multiple attempts to reach patient. Will sign off the care team at this time. Will be happy to assist with any needs if patient reaches out in the future.     PCP/Specialist follow up:   Future Appointments         Provider Specialty Dept Phone    09/13/2023 10:30 AM Junella Olden, [place] [facility] [phone]    09/19/2023 11:00 AM James Mcardle, APRN - CNP Family Medicine [phone]    09/26/2023 10:45 AM Leeanna Puff, MD Gastroenterology [phone]    10/03/2023 10:15 AM Keane Passe, MD General Surgery [phone]    10/16/2023 11:00 AM [facility] Radiology [phone]    10/16/2023 11:30 AM [facility] DEXA RM Radiology [phone]

## 2023-08-20 ENCOUNTER — Ambulatory Visit: Admit: 2023-08-20 | Discharge: 2023-08-20 | Payer: Medicare (Managed Care) | Primary: Family

## 2023-08-20 DIAGNOSIS — R399 Unspecified symptoms and signs involving the genitourinary system: Secondary | ICD-10-CM

## 2023-08-20 LAB — POCT URINALYSIS DIPSTICK W/O MICROSCOPE (AUTO)
Bilirubin, UA: NEGATIVE
Glucose, UA POC: NEGATIVE mg/dL
Ketones, UA: NEGATIVE mg/dL
Nitrite, UA: NEGATIVE
Protein, UA POC: NEGATIVE mg/dL
Spec Grav, UA: 1.005 — AB
Urobilinogen, UA: 0.2 mg/dL
pH, UA: 6

## 2023-08-20 NOTE — Progress Notes (Signed)
 Pt in office for UTI symptoms- pressure in bladder, frequency, unable to void small amounts x 2-3 days.     Per Candace- send urine for culture  Patient was informed that urine is being sent out for culture which will take 48 hrs to get result back and that we will contact her once it is. Patient verbalized understanding.

## 2023-09-13 ENCOUNTER — Ambulatory Visit
Admit: 2023-09-13 | Discharge: 2023-09-13 | Payer: Medicare (Managed Care) | Attending: Foot & Ankle Surgery | Primary: Family

## 2023-09-13 DIAGNOSIS — B351 Tinea unguium: Principal | ICD-10-CM

## 2023-09-13 NOTE — Progress Notes (Signed)
 SUBJECTIVE: Marissa Sawyer is a 73 y.o. female who returns to the office with chief complaint of painful fungal toenails. Patient relates toe nails are thickened/difficult to trim as well as painful with ambulation and with shoe gear.   Chief Complaint   Patient presents with    Nail Problem     Nail trim/last saw Dr.Tara Lang  06/28/23    Diabetes     Blood sugar 140      Review of Systems   Constitutional:  Negative for activity change, appetite change, chills, diaphoresis, fatigue and fever.   Respiratory:  Negative for shortness of breath.    Cardiovascular:  Negative for leg swelling.   Gastrointestinal:  Negative for diarrhea and nausea.   Endocrine: Negative for cold intolerance, heat intolerance and polyuria.   Musculoskeletal:  Positive for arthralgias. Negative for back pain, gait problem, joint swelling and myalgias.   Skin:  Negative for color change, pallor, rash and wound.   Allergic/Immunologic: Negative for environmental allergies and food allergies.   Neurological:  Negative for dizziness, weakness, light-headedness and numbness.   Hematological:  Does not bruise/bleed easily.   Psychiatric/Behavioral:  Negative for behavioral problems, confusion and self-injury. The patient is not nervous/anxious.      OBJECTIVE: Clinical evaluation of patient reveals nails 1,2,3,4,5 of the right foot and nails 1,2,3,4,5 of the left foot to present with thickness, elongation, discoloration, brittleness, and subungual debris. There was pain with palpation and debridement of the toenails of the bilateral feet. No open lesions noted to either foot today.   The right DP pulse is not palpable.   The left DP pulse is not palpable.   The right PT pulse is not palpable.   The left PT pulse is not palpable.   Protective sensation is present to the right plantar foot as noted with a 5.07 Semmes-Weinstein monofilament.   Protective sensation is present to the left plantar foot as noted with a 5.07 Semmes-Weinstein  monofilament.   Glucose: 140 mg/dl.    Class A Findings (1 needed)   []  Non-traumatic amputation of foot or integral skeleton portion thereof.   []  Q7.      Class B Findings (2 needed)   1. [x]  Absent posterior tibial pulse   2. [x]  Absent dorsalis pedis pulse   3. []  Advanced trophic changes; three of the following are required:            []  hair growth (decrease or absence)            []  nail changes (thickening)            []  pigmentary changes (discoloration)            []  skin texture (thin, shiny)            []  skin color (rubor or redness)   [x]  Q8.      Class C Findings (1 Class B, 2 Class C needed)   1. []  Claudication   2. []  Temperature changes   3. []  Edema   4. []  Paresthesia   5. []  Burning   []  Q9.     ASSESSMENT:    Diagnosis Orders   1. Onychomycosis of toenail  DEBRIDEMENT OF NAILS, 6 OR MORE    HM DIABETES FOOT EXAM      2. Pain of toes of both feet  DEBRIDEMENT OF NAILS, 6 OR MORE    HM DIABETES FOOT EXAM      3. 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 Return in about 9 weeks (around 11/15/2023) for At risk diabetic foot care.   09/13/2023      Arvell Pulsifer E Eliah Ozawa, DPM

## 2023-09-19 ENCOUNTER — Inpatient Hospital Stay: Payer: Medicare (Managed Care) | Primary: Family

## 2023-09-19 ENCOUNTER — Ambulatory Visit: Admit: 2023-09-19 | Discharge: 2023-09-19 | Payer: Medicare (Managed Care) | Attending: Family | Primary: Family

## 2023-09-19 VITALS — BP 122/86 | HR 61 | Ht 64.0 in | Wt 150.6 lb

## 2023-09-19 DIAGNOSIS — Z0000 Encounter for general adult medical examination without abnormal findings: Principal | ICD-10-CM

## 2023-09-19 DIAGNOSIS — R350 Frequency of micturition: Principal | ICD-10-CM

## 2023-09-19 LAB — MAGNESIUM: Magnesium: 2 mg/dL (ref 1.6–2.4)

## 2023-09-19 LAB — POCT GLYCOSYLATED HEMOGLOBIN (HGB A1C): Hemoglobin A1C: 6.7 %

## 2023-09-19 LAB — URINALYSIS
Bilirubin, Urine: NEGATIVE
Glucose, Ur: NEGATIVE mg/dL
Ketones, Urine: NEGATIVE mg/dL
Nitrite, Urine: NEGATIVE
Protein, UA: NEGATIVE mg/dL
Specific Gravity, UA: 1.01 (ref 1.005–1.030)
Urine Hgb: NEGATIVE
Urobilinogen, Urine: NORMAL EU/dL (ref 0.0–1.0)
pH, Urine: 5.5 (ref 5.0–8.0)

## 2023-09-19 LAB — BASIC METABOLIC PANEL
Anion Gap: 10 mmol/L (ref 9–16)
BUN: 25 mg/dL — ABNORMAL HIGH (ref 8–23)
CO2: 27 mmol/L (ref 20–31)
Calcium: 9.9 mg/dL (ref 8.6–10.4)
Chloride: 104 mmol/L (ref 98–107)
Creatinine: 0.8 mg/dL (ref 0.6–0.9)
Est, Glom Filt Rate: 78 mL/min/1.73m2 (ref >60)
Glucose: 130 mg/dL — ABNORMAL HIGH (ref 74–99)
Potassium: 5.4 mmol/L — ABNORMAL HIGH (ref 3.7–5.3)
Sodium: 141 mmol/L (ref 136–145)

## 2023-09-19 LAB — MICROSCOPIC URINALYSIS
Casts UA: 2 /LPF (ref 0–8)
Epithelial Cells, UA: 2 /HPF (ref 0–5)
RBC, UA: 0 /HPF (ref 0–4)
WBC, UA: 20 /HPF (ref 0–5)

## 2023-09-19 MED ORDER — BUSPIRONE HCL 10 MG PO TABS
10 | ORAL_TABLET | Freq: Three times a day (TID) | ORAL | 5 refills | 30.00000 days | Status: AC | PRN
Start: 2023-09-19 — End: ?

## 2023-09-19 NOTE — Patient Instructions (Signed)
 Preventing Falls: Care Instructions  Injuries and health problems such as trouble walking or poor eyesight can increase your risk of falling. So can some medicines. But there are things you can do to help prevent falls. You can exercise to get stronger. You can also arrange your home to make it safer.    Talk to your doctor about the medicines you take. Ask if any of them increase the risk of falls and whether they can be changed or stopped.   Try to exercise regularly. It can help improve your strength and balance. This can help lower your risk of falling.         Practice fall safety and prevention.   Wear low-heeled shoes that fit well and give your feet good support. Talk to your doctor if you have foot problems that make this hard.  Carry a cellphone or wear a medical alert device that you can use to call for help.  Use stepladders instead of chairs to reach high objects. Don't climb if you're at risk for falls. Ask for help, if needed.  Wear the correct eyeglasses, if you need them.        Make your home safer.   Remove rugs, cords, clutter, and furniture from walkways.  Keep your house well lit. Use night-lights in hallways and bathrooms.  Install and use sturdy handrails on stairways.  Wear nonskid footwear, even inside. Don't walk barefoot or in socks without shoes.        Be safe outside.   Use handrails, curb cuts, and ramps whenever possible.  Keep your hands free by using a shoulder bag or backpack.  Try to walk in well-lit areas. Watch out for uneven ground, changes in pavement, and debris.  Be careful in the winter. Walk on the grass or gravel when sidewalks are slippery. Use de-icer on steps and walkways. Add non-slip devices to shoes.    Put grab bars and nonskid mats in your shower or tub and near the toilet. Try to use a shower chair or bath bench when bathing.   Get into a tub or shower by putting in your weaker leg first. Get out with your strong side first. Have a phone or medical alert  device in the bathroom with you.   Where can you learn more?  Go to [employer].ca and enter G117 to learn more about Preventing Falls: Care Instructions.  Current as of: October 04, 2022  Content Version: 14.5   [address], [place].   Care instructions adapted under license by [facility]. If you have questions about a medical condition or this instruction, always ask your healthcare professional. Romayne Alderman, [facility], disclaims any warranty or liability for your use of this information.         Learning About Stress  What is stress?     Stress is your body's response to a hard situation. Your body can have a physical, emotional, or mental response. Stress is a fact of life for most people, and it affects everyone differently. What causes stress for you may not be stressful for someone else.  A lot of things can cause stress. You may feel stress when you go on a job interview, take a test, or run a race. This kind of short-term stress is normal and even useful. It can help you if you need to work hard or react quickly. For example, stress can help you finish an important job on time.  Long-term stress is caused by  ongoing stressful situations or events. Examples of long-term stress include long-term health problems, ongoing problems at work, or conflicts in your family. Long-term stress can harm your health.  How does stress affect your health?  When you are stressed, your body responds as though you are in danger. It makes hormones that speed up your heart, make you breathe faster, and give you a burst of energy. This is called the fight-or-flight stress response. If the stress is over quickly, your body goes back to normal and no harm is done.  But if stress happens too often or lasts too long, it can have bad effects. Long-term stress can make you more likely to get sick, and it can make symptoms of some diseases worse. If you tense up when you are stressed, you may develop  neck, shoulder, or low back pain. Stress is linked to high blood pressure and heart disease.  Stress also harms your emotional health. It can make you moody, tense, or depressed. Your relationships may suffer, and you may not do well at work or school.  What can you do to manage stress?  You can try these things to help manage stress:   Do something active. Exercise or activity can help reduce stress. Walking is a great way to get started. Even everyday activities such as housecleaning or yard work can help.  Try yoga or tai chi. These techniques combine exercise and meditation. You may need some training at first to learn them.  Do something you enjoy. For example, listen to music or go to a movie. Practice your hobby or do volunteer work.  Meditate. This can help you relax, because you are not worrying about what happened before or what may happen in the future.  Do guided imagery. Imagine yourself in any setting that helps you feel calm. You can use online videos, books, or a teacher to guide you.  Do breathing exercises. For example:  From a standing position, bend forward from the waist with your knees slightly bent. Let your arms dangle close to the floor.  Breathe in slowly and deeply as you return to a standing position. Roll up slowly and lift your head last.  Hold your breath for just a few seconds in the standing position.  Breathe out slowly and bend forward from the waist.  Let your feelings out. Talk, laugh, cry, and express anger when you need to. Talking with supportive friends or family, a Veterinary surgeon, or a faith leader about your feelings is a healthy way to relieve stress. Avoid discussing your feelings with people who make you feel worse.  Write. It may help to write about things that are bothering you. This helps you find out how much stress you feel and what is causing it. When you know this, you can find better ways to cope.  What can you do to prevent stress?  You might try some of these things  to help prevent stress:  Manage your time. This helps you find time to do the things you want and need to do.  Get enough sleep. Your body recovers from the stresses of the day while you are sleeping.  Get support. Your family, friends, and community can make a difference in how you experience stress.  Limit your news feed. Avoid or limit time on social media or news that may make you feel stressed.  Do something active. Exercise or activity can help reduce stress. Walking is a great way to get started.  Where can you learn more?  Go to [employer].ca and enter N032 to learn more about Learning About Stress.  Current as of: December 28, 2022  Content Version: 14.5   [address] [place].   Care instructions adapted under license by [facility]. If you have questions about a medical condition or this instruction, always ask your healthcare professional. Romayne Alderman, [facility], disclaims any warranty or liability for your use of this information.         Advance Directives: Care Instructions  Overview  An advance directive is a legal way to state your wishes at the end of your life. It tells your loved ones and doctor what to do if you can't say what you want.  There are two main types of advance directives. You can change them any time your wishes change.  Living will. This form tells your loved ones and doctor your wishes about life support and other treatment. The form is also called a declaration.  Medical power of attorney. This form lets you name a person to make treatment decisions for you when you can't speak for yourself. This person is called a health care agent (health care proxy, health care surrogate). The form is also called a durable power of attorney for health care.  If you do not have an advance directive, decisions about your medical care may be made by a family member or doctor who doesn't know you or by a judge.  It may help to think of an advance directive as  a gift to the people who care for you. If you have one, they won't have to make tough decisions by themselves.  For more information, including forms for your state, see the [employer]aring[employer] website (PlumberBiz.com.cy).  Follow-up care is a key part of your treatment and safety. Be sure to make and go to all appointments, and call your doctor if you are having problems. It's also a good idea to know your test results and keep a list of the medicines you take.  What should you include in an advance directive?  Many states have a unique advance directive form. (It may ask you to address specific issues.) Or you might use a universal form that's approved by many states.  If your form doesn't tell you what to address, it may be hard to know what to include in your advance directive. Use the questions below to help you get started.  Who do you want to make decisions about your medical care if you are not able to?  What life-support measures do you want if you have a serious illness that gets worse over time or can't be cured?  What are you most afraid of that might happen? (Maybe you're afraid of having pain, losing your independence, or being kept alive by machines.)  Where would you prefer to die? (Your home? A hospital? A nursing home?)  Do you want to donate your organs when you die?  Do you want certain religious practices performed before you die?  When should you call for help?  Be sure to contact your doctor if you have any questions.  Where can you learn more?  Go to [employer].ca and enter R264 to learn more about Advance Directives: Care Instructions.  Current as of: July 04, 2022  Content Version: 14.5   [address], [place].   Care instructions adapted under license by [facility]. If you have questions about a medical condition or this instruction, always ask your  healthcare professional. Romayne Alderman, [facility], disclaims any warranty or  liability for your use of this information.         A Healthy Heart: Care Instructions  Overview     Coronary artery disease, also called heart disease, occurs when a substance called plaque builds up in the vessels that supply oxygen-rich blood to your heart muscle. This can narrow the blood vessels and reduce blood flow. A heart attack happens when blood flow is completely blocked. A high-fat diet, smoking, and other factors increase the risk of heart disease.  Your doctor has found that you have a chance of having heart disease. A heart-healthy lifestyle can help keep your heart healthy and prevent heart disease. This lifestyle includes eating healthy, being active, staying at a weight that's healthy for you, and not smoking or using tobacco. It also includes taking medicines as directed, managing other health conditions, and trying to get a healthy amount of sleep.  Follow-up care is a key part of your treatment and safety. Be sure to make and go to all appointments, and call your doctor if you are having problems. It's also a good idea to know your test results and keep a list of the medicines you take.  How can you care for yourself at home?  Diet    Use less salt when you cook and eat. This helps lower your blood pressure. Taste food before salting. Add only a little salt when you think you need it. With time, your taste buds will adjust to less salt.     Eat fewer snack items, fast foods, canned soups, and other high-salt, high-fat, processed foods.     Read food labels and try to avoid saturated and trans fats. They increase your risk of heart disease by raising cholesterol levels.     Limit the amount of solid fat--butter, margarine, and shortening--you eat. Use olive, peanut, or canola oil when you cook. Bake, broil, and steam foods instead of frying them.     Eat a variety of fruit and vegetables every day. Dark green, deep orange, red, or yellow fruits and vegetables are especially good for you.  Examples include spinach, carrots, peaches, and berries.     Foods high in fiber can reduce your cholesterol and provide important vitamins and minerals. High-fiber foods include whole-grain cereals and breads, oatmeal, beans, brown rice, citrus fruits, and apples.     Eat lean proteins. Heart-healthy proteins include seafood, lean meats and poultry, eggs, beans, peas, nuts, seeds, and soy products.     Limit drinks and foods with added sugar. These include candy, desserts, and soda pop.   Heart-healthy lifestyle    If your doctor recommends it, get more exercise. For many people, walking is a good choice. Or you may want to swim, bike, or do other activities. Bit by bit, increase the time you're active every day. Try for at least 30 minutes on most days of the week.     Try to quit or cut back on using tobacco and other nicotine products. This includes smoking and vaping. If you need help quitting, talk to your doctor about stop-smoking programs and medicines. These can increase your chances of quitting for good. Quitting is one of the most important things you can do to protect your heart. It is never too late to quit. Try to avoid secondhand smoke too.     Stay at a weight that's healthy for you. Talk to your doctor if you need help  losing weight.     Try to get 7 to 9 hours of sleep each night.     Limit alcohol  to 2 drinks a day for men and 1 drink a day for women. Too much alcohol  can cause health problems.     Manage other health problems such as diabetes, high blood pressure, and high cholesterol. If you think you may have a problem with alcohol  or drug use, talk to your doctor.   Medicines    Take your medicines exactly as prescribed. Call your doctor if you think you are having a problem with your medicine.     If your doctor recommends aspirin, take the amount directed each day. Make sure you take aspirin and not another kind of pain reliever, such as acetaminophen  (Tylenol ).   When should you call for  help?   Call 911 if you have symptoms of a heart attack. These may include:    Chest pain or pressure, or a strange feeling in the chest.     Sweating.     Shortness of breath.     Pain, pressure, or a strange feeling in the back, neck, jaw, or upper belly or in one or both shoulders or arms.     Lightheadedness or sudden weakness.     A fast or irregular heartbeat.   After you call 911, the operator may tell you to chew 1 adult-strength or 2 to 4 low-dose aspirin. Wait for an ambulance. Do not try to drive yourself.  Watch closely for changes in your health, and be sure to contact your doctor if you have any problems.  Where can you learn more?  Go to [employer].ca and enter F075 to learn more about A Healthy Heart: Care Instructions.  Current as of: October 04, 2022  Content Version: 14.5   [address], [place].   Care instructions adapted under license by [facility]. If you have questions about a medical condition or this instruction, always ask your healthcare professional. Romayne Alderman, [facility], disclaims any warranty or liability for your use of this information.    Personalized Preventive Plan for Marissa Sawyer - 09/19/2023  Medicare offers a range of preventive health benefits. Some of the tests and screenings are paid in full while other may be subject to a deductible, co-insurance, and/or copay.  Some of these benefits include a comprehensive review of your medical history including lifestyle, illnesses that may run in your family, and various assessments and screenings as appropriate.  After reviewing your medical record and screening and assessments performed today your provider may have ordered immunizations, labs, imaging, and/or referrals for you.  A list of these orders (if applicable) as well as your Preventive Care list are included within your After Visit Summary for your review.

## 2023-09-19 NOTE — Progress Notes (Signed)
 Medicare Annual Wellness Visit    Marissa Sawyer is here for Medicare AWV    Assessment & Plan   Medicare annual wellness visit, subsequent  Primary hypertension  Urine frequency  -     Urinalysis; Future  -     Culture, Urine; Future  Controlled type 2 diabetes mellitus without complication, without long-term current use of insulin  (HCC)  -     POCT glycosylated hemoglobin (Hb A1C)  Anxiety  -     busPIRone  (BUSPAR ) 10 MG tablet; Take 1 tablet by mouth 3 times daily as needed (anxiety), Disp-90 tablet, R-5Normal  Leg cramp  -     Basic Metabolic Panel; Future  -     Magnesium ; Future  Diverticulosis of colon       Return in about 6 months (around 03/21/2024) for Follow up DM.     Subjective   The following acute and/or chronic problems were also addressed today:    History of Present Illness  The patient presents for evaluation of diabetes, diverticulosis, and urinary retention.    She is concerned about her A1c levels, which were previously elevated. She reports experiencing stress related to home issues and has noticed a weight gain of 2 pounds due to increased eating. She is currently on atorvastatin , taken every other day, and cholestyramine , which she does not take daily. She also supplements with vitamins C, D, E, and magnesium , but not on a daily basis.    She underwent an appendectomy following a misdiagnosis of stomachache as irritable bowel syndrome. She experienced chills at night and severe pain during urination, prompting a visit to the ER. She has been advised to avoid popcorn and nuts due to her diverticulosis. She is scheduled for a colonoscopy with Dr. Marlou to check the condition of her colon post-surgery. She avoids cheese, cream cheese, peanut butter, and wine, and limits her intake of brown rice. She takes Pepto-Bismol and Lactaid as needed, and over-the-counter omeprazole  intermittently. She reports no chest discomfort or shortness of breath.    She continues to experience urinary retention and  frequent urination. She has noticed cloudy urine and was informed of trace blood in her urine, although she has not observed any blood herself.    She has been experiencing cramps in her toes, which her podiatrist attributes to her diabetes. He suggested that she might need potassium supplementation.    Diet: Avoids cheese, cream cheese, peanut butter, and wine; limits intake of brown rice    PAST SURGICAL HISTORY:  - Appendectomy  - Gallbladder removal       Patient's complete Health Risk Assessment and screening values have been reviewed and are found in Flowsheets. The following problems were reviewed today and where indicated follow up appointments were made and/or referrals ordered.    Positive Risk Factor Screenings with Interventions:    Fall Risk:  Do you feel unsteady or are you worried about falling? : (!) yes  2 or more falls in past year?: no  Fall with injury in past year?: no  Interventions:    Reviewed medications, home hazards, visual acuity, and co-morbidities that can increase risk for falls            General HRA Questions:  Select all that apply: (!) Stress  Interventions - Stress:  See AVS for additional education material               Advanced Directives:  Do you have a Living Will?: (!) No  Intervention:  has NO advanced directive - information provided               Objective   Vitals:    09/19/23 1053   BP: 122/86   Pulse: 61   SpO2: 98%   Weight: 68.3 kg (150 lb 9.6 oz)   Height: 1.626 m (5' 4)      Body mass index is 25.85 kg/m.      General Appearance: alert and oriented to person, place and time, well-developed and well-nourished, in no acute distress  Pulmonary/Chest: clear to auscultation bilaterally- no wheezes, rales or rhonchi, normal air movement, no respiratory distress  Cardiovascular: normal rate, normal S1 and S2, no gallops, intact distal pulses, and no carotid bruits  Abdomen: soft, non-tender, non-distended, normal bowel sounds, no masses or  organomegaly  Musculoskeletal: normal range of motion, no joint swelling, deformity or tenderness          No Known Allergies  Prior to Visit Medications    Medication Sig Taking? Authorizing Provider   busPIRone  (BUSPAR ) 10 MG tablet Take 1 tablet by mouth 3 times daily as needed (anxiety) Yes Bonney, Mcadoo Muzquiz, APRN - CNP   lisinopril  (PRINIVIL ;ZESTRIL ) 10 MG tablet TAKE 1 TABLET BY MOUTH DAILY Yes Galia Rahm, APRN - CNP   dibucaine 1 % perianal ointment Place around the anus 4 times daily as needed for Pain Yes Abran Bienenstock M, APRN - CNP   cholestyramine  (QUESTRAN ) 4 g packet Take 1 packet by mouth 2 times daily Yes Sofi, Aijaz, MD   metFORMIN  (GLUCOPHAGE -XR) 500 MG extended release tablet TAKE 1 TABLET BY MOUTH DAILY Yes Marcquis Ridlon, APRN - CNP   Alcohol  Swabs (ALCOHOL  PREP) 70 % PADS Check sugar daily dx E11.9 Yes Lang Rexene Mire, MD   Lancet Devices (LANCING DEVICE) MISC Check sugar daily dx E11.9 Yes Lang Rexene Mire, MD   Blood Glucose Monitoring Suppl (ONE TOUCH ULTRA 2) w/Device KIT Check sugar daily dx E11.9 Yes Lang Rexene Mire, MD   blood glucose test strips (ASCENSIA AUTODISC VI;ONE TOUCH ULTRA TEST VI) strip Check sugar daily dx E11.9 Yes Lang Rexene Mire, MD   atorvastatin  (LIPITOR ) 20 MG tablet Take 1 tablet by mouth every other day Yes Bonney Pellet, APRN - CNP   ONETOUCH ULTRA strip use 1 TEST STRIP to TEST BLOOD SUGAR once daily Yes Court Knee, APRN - CNP         Assessment & Plan  1. Diabetes Mellitus.  - A1c level has decreased to 6.7.  - Physical exam and lab results indicate good control of blood glucose.  - Discussed the importance of maintaining a balanced diet and regular exercise.  - Continue current medication regimen, including atorvastatin  every other day.    2. Diverticulosis.  - Diagnosed with uncomplicated diverticulosis.  - Physical exam and CT scan show no signs of complications.  - Advised to avoid foods that can get stuck in the pouches, such as  popcorn and nuts, and to increase fiber intake.  - Monitor for severe abdominal pain and report to the clinic if it occurs.    3. Urinary Retention.  - Reports urinary frequency and urgency.  - Previous urinalysis was not cultured due to lab error.  - Urinalysis and urine culture ordered today to check for underlying issues.  - Advised to follow up with results and any persistent symptoms.    4. Muscle Cramps.  - Reports experiencing muscle cramps.  - Physical exam suggests possible electrolyte imbalance.  -  Blood test ordered today to check potassium and magnesium  levels.  - Appropriate supplementation will be recommended if levels are low.     CareTeam (Including outside providers/suppliers regularly involved in providing care):   Patient Care Team:  Bonney Pellet, APRN - CNP as PCP - General (Certified Nurse Practitioner)  Bonney Pellet, APRN - CNP as PCP - Empaneled Provider  Pangulur, Patty SAILOR, MD as Consulting Physician (Gastroenterology)     Recommendations for Preventive Services Due: see orders and patient instructions/AVS.  Recommended screening schedule for the next 5-10 years is provided to the patient in written form: see Patient Instructions/AVS.     Reviewed and updated this visit:  Tobacco  Allergies  Meds  Problems  Med Hx  Surg Hx  Fam Hx

## 2023-09-19 NOTE — Progress Notes (Unsigned)
Pt in office for medicare wellness.

## 2023-09-20 LAB — CULTURE, URINE: Culture: NO GROWTH

## 2023-09-21 NOTE — Telephone Encounter (Signed)
 Pt called into the office asking for further explanation on results and what was further recommended. Information explained to patient and pt scheduled for 7/21

## 2023-09-24 ENCOUNTER — Ambulatory Visit: Admit: 2023-09-24 | Discharge: 2023-09-24 | Payer: Medicare (Managed Care) | Primary: Family

## 2023-09-24 DIAGNOSIS — N760 Acute vaginitis: Principal | ICD-10-CM

## 2023-09-24 NOTE — Progress Notes (Signed)
 Patient was instructed by pcp to come in for bv swab. Swab was obtained and sent out for culture.     Patient was informed we will call her once we receive results.

## 2023-09-25 ENCOUNTER — Inpatient Hospital Stay: Payer: Medicare (Managed Care) | Primary: Family

## 2023-09-25 LAB — VAGINITIS DNA PROBE
Candida species: NEGATIVE
GARDNERELLA VAGINALIS: NEGATIVE
Trichomonas: NEGATIVE

## 2023-09-28 ENCOUNTER — Ambulatory Visit
Admit: 2023-09-28 | Discharge: 2023-09-28 | Payer: Medicare (Managed Care) | Attending: Internal Medicine | Primary: Family

## 2023-09-28 VITALS — BP 165/73 | HR 50 | Temp 98.00000°F | Resp 16 | Ht 64.0 in | Wt 149.8 lb

## 2023-09-28 DIAGNOSIS — K580 Irritable bowel syndrome with diarrhea: Principal | ICD-10-CM

## 2023-09-28 MED ORDER — CHOLESTYRAMINE 4 G PO PACK
4 | PACK | Freq: Two times a day (BID) | ORAL | 3 refills | 45.00000 days | Status: AC
Start: 2023-09-28 — End: ?

## 2023-09-28 MED ORDER — PANTOPRAZOLE SODIUM 40 MG PO TBEC
40 | ORAL_TABLET | Freq: Every day | ORAL | 2 refills | 30.00000 days | Status: DC
Start: 2023-09-28 — End: 2023-12-07
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
## 2023-09-28 NOTE — Other (Signed)
 Called pt. Pt said she is not having any dysuria or abd pressure. She said that she is feeling better.

## 2023-10-01 ENCOUNTER — Encounter

## 2023-10-01 MED ORDER — ATORVASTATIN CALCIUM 20 MG PO TABS
20 | ORAL_TABLET | ORAL | 3 refills | 30.00000 days | Status: AC
Start: 2023-10-01 — End: ?

## 2023-10-01 NOTE — Telephone Encounter (Signed)
 Next Visit Date:  Future Appointments   Date Time Provider Department Center   10/03/2023 10:15 AM Marlou Penman, MD [facility] Gen Surg [facility]   10/16/2023 11:00 AM [facility] DIGITAL RM [facility] Radiolog   10/16/2023 11:30 AM [facility] DEXA RM [facility] Radiolog   11/15/2023 10:30 AM Omar Guillermina BRAVO, DPM [place]  Pod [facility]   03/21/2024 10:00 AM Bonney Pellet, APRN - CNP [place] [facility] ECC DEP   03/28/2024 11:00 AM Sofi, Chalmer, MD [place]  GI [facility]       Last Visit Date:  09/19/2023  Last Fill:  07/19/22

## 2023-10-03 ENCOUNTER — Ambulatory Visit: Admit: 2023-10-03 | Discharge: 2023-10-03 | Payer: Medicare (Managed Care) | Attending: Surgery | Primary: Family

## 2023-10-03 VITALS — BP 150/80 | HR 61 | Temp 97.20000°F | Ht 64.0 in | Wt 148.3 lb

## 2023-10-03 DIAGNOSIS — K625 Hemorrhage of anus and rectum: Principal | ICD-10-CM

## 2023-10-03 NOTE — Patient Instructions (Signed)
 Patient was seen and examined.  She is status post laparoscopic appendectomy in April 2025 with benign pathology.  She follows with [facility] GI physicians.  Given her age which is little unusual for appendicitis have recommended colonoscopy sometime this fall.  Will let her discuss that with her GI physician.  From my standpoint she has recovered well and she will see me as needed.
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
## 2023-10-05 NOTE — Telephone Encounter (Signed)
 Patient is requesting a return call to schedule a colonoscopy with Dr Marlyce. Patient states Dr Marlou is recommending she have a colonoscopy.    Thank you.

## 2023-10-08 ENCOUNTER — Encounter

## 2023-10-08 NOTE — Telephone Encounter (Signed)
 Procedure scheduled/Dr Sofi  Procedure: Colonoscopy  Dx: Screening  Date: 12/03/23  Time: 9:45 AM/ 7:45 AM arrival  Hospital: [facility]  PAT phone call: TBS  Bowel Prep instructions given: Miralax /Dulcolax  In office/via phone: Phone/Mailed Bowel Prep Instructions  Clearance needed: NA  GLP-1: NA

## 2023-10-08 NOTE — Telephone Encounter (Signed)
 Me to Lyna Fish, [place]  KV      10/05/23 11:47 AM  I did not see a referral from [place] in the patients chart or work queues. It looks like she was also here recently on 09/28/23. Please advise what you'd like me to do. Thank you!  Lyna Fish, MA to Me  AW      10/05/23 12:59 PM  It looks like patient saw Dr. Marlou on 10/03/23, however, his notes are not signed yet. Will need to wait until he does to verify a colonoscopy is warranted.

## 2023-10-16 ENCOUNTER — Inpatient Hospital Stay: Admit: 2023-10-16 | Payer: Medicare (Managed Care) | Primary: Family

## 2023-10-16 ENCOUNTER — Encounter

## 2023-10-16 DIAGNOSIS — Z1231 Encounter for screening mammogram for malignant neoplasm of breast: Principal | ICD-10-CM

## 2023-10-16 DIAGNOSIS — Z780 Asymptomatic menopausal state: Principal | ICD-10-CM

## 2023-10-30 ENCOUNTER — Encounter

## 2023-10-30 MED ORDER — METFORMIN HCL ER 500 MG PO TB24
500 | ORAL_TABLET | Freq: Every day | ORAL | 1 refills | Status: AC
Start: 2023-10-30 — End: ?

## 2023-10-30 NOTE — Telephone Encounter (Signed)
 Next Visit Date:  Future Appointments   Date Time Provider Department Center   11/15/2023 10:30 AM Omar Guillermina BRAVO, DPM [place]  Pod [facility]   11/19/2023  2:00 PM [facility] PAT RM 5 [facility] PAT [place]   03/21/2024 10:00 AM Bonney Pellet, APRN - CNP [place] [facility] ECC DEP   03/28/2024 11:00 AM Marlyce Northern, MD [place]  GI [facility]       Last Visit Date:  09/19/2023  Last Fill:  03/21/23

## 2023-11-15 ENCOUNTER — Encounter: Payer: Medicare (Managed Care) | Attending: Foot & Ankle Surgery | Primary: Family

## 2023-11-19 ENCOUNTER — Inpatient Hospital Stay: Admit: 2023-11-19 | Payer: Medicare (Managed Care) | Primary: Family

## 2023-11-19 NOTE — Progress Notes (Signed)
 Pre-op Instructions For Out-Patient Endoscopy Surgery    Medication Instructions:  Please stop herbs and any supplements now (includes vitamins and minerals).    For these GLP-1 medications:  Dulaglutide (Trulicity), Exenatide (Byetta and Bydureon, Liraglutide (Victoza), Lixisenatide (Adlyxin), Semaglutide (Ozempic and Rybelsus), Tirzepatide (Mounjaro, Zepbound,Wegovy)- Anesthesia recommends to stop 1 week prior if taking weekly or 1 day prior if taking every 12 hours or daily. Please contact PCP if you have difficulty managing blood sugar for further instructions.     For these SGLT-2 medications: Canagliflozin (Invokana), Dapagliflozin Pauletta), Empagliflozin (Jardiance), Ertugliflozin (Steglatro), Bexagliflozin (Brenzavvy)-Anesthesia recommends to stop 3 days prior to surgery. Please contact PCP if you have difficulty managing blood sugar for further instructions.      Please contact your surgeon and prescribing physician for pre-op instructions for any blood thinners.    If you have inhalers/aerosol treatments at home, please use them the morning of your surgery and bring the inhalers with you to the hospital.    Please take the following medications the morning of your surgery with a sip of water:    none    Surgery Instructions:  After midnight before surgery:  Do not eat or drink anything, including water, mints, gum, and hard candy.  You may brush your teeth without swallowing.  No smoking, chewing tobacco, or street drugs.       ** Please Follow Bowel Prep instructions if given by surgeon's office**    Please shower or bathe before surgery.       Please do not wear any cologne, lotion, powder, jewelry, piercings, perfume, makeup, nail polish, hair accessories, or hair spray on the day of surgery.  Wear loose comfortable clothing.    Leave your valuables at home but bring a payment source for any after-surgery prescriptions you plan to fill at [facility].  Bring a storage case for any  glasses/contacts.    An adult who is responsible for you MUST remain in the hospital and drive you home and should be with you for the first 24 hours after surgery.     The Day of Surgery:  Arrive at [facility] Surgery Entrance at the time directed by your surgeon and check in at the desk.     If you have a living will or healthcare power of attorney, please bring a copy.    You will be taken to the pre-op holding area where you will be prepared for surgery.  A physical assessment will be performed by a nurse practitioner or house officer.  Your IV will be started and you will meet your anesthesiologist.    When you go to surgery, your family will be directed to the surgical waiting room, where the doctor should speak with them after your surgery.    After surgery, you will be taken to the recovery area.  When you are alert and stable, you will receive instructions and be prepared for discharge.   Instructions reviewed by phone with Ronal, she verbalized understanding.

## 2023-11-29 NOTE — Patient Instructions (Signed)
 No answer, left message ?                             Unable to leave message ?    When were you told to arrive at hospital ?      Do you have a driver ?    Are you on any blood thinners ?                     If yes when did you stop taking ?    Do you have your prep Rx filled and instruction ?      Nothing to eat the day before , only clear liquids.    Are you experiencing any covid symptoms ?     Do you have any infections or rash we should be aware of ?      Do you have the Hibiclens soap to use the night before and the morning of surgery ?    Nothing to eat or drink after midnight, only a sip of water to take any medication instructed to take the night before.  Wear comfortable clothing, leave any valuables at home, remove any jewelry and body piercing . LEFT A VM REMINDER OF SURGERY AND ARRIVAL TIME OF [phone]

## 2023-12-03 ENCOUNTER — Inpatient Hospital Stay: Payer: Medicare (Managed Care)

## 2023-12-03 LAB — POC GLUCOSE FINGERSTICK: POC Glucose: 132 mg/dL — ABNORMAL HIGH (ref 65–105)

## 2023-12-03 MED ORDER — LIDOCAINE HCL (PF) 2 % IJ SOLN
2 | INTRAMUSCULAR | Status: AC
Start: 2023-12-03 — End: 2023-12-03

## 2023-12-03 MED ORDER — NORMAL SALINE FLUSH 0.9 % IV SOLN
0.9 | Freq: Two times a day (BID) | INTRAVENOUS | Status: DC
Start: 2023-12-03 — End: 2023-12-03

## 2023-12-03 MED ORDER — NORMAL SALINE FLUSH 0.9 % IV SOLN
0.9 | INTRAVENOUS | Status: DC | PRN
Start: 2023-12-03 — End: 2023-12-03

## 2023-12-03 MED ORDER — LIDOCAINE HCL (PF) 1 % IJ SOLN
1 | Freq: Once | INTRAMUSCULAR | Status: DC | PRN
Start: 2023-12-03 — End: 2023-12-03

## 2023-12-03 MED ORDER — PROPOFOL 200 MG/20ML IV EMUL
200 | Freq: Once | INTRAVENOUS | Status: DC | PRN
Start: 2023-12-03 — End: 2023-12-03
  Administered 2023-12-03: 14:00:00 150 via INTRAVENOUS
  Administered 2023-12-03: 14:00:00 80 via INTRAVENOUS

## 2023-12-03 MED ORDER — LIDOCAINE HCL (PF) 2 % IJ SOLN
2 | Freq: Once | INTRAMUSCULAR | Status: DC | PRN
Start: 2023-12-03 — End: 2023-12-03
  Administered 2023-12-03: 14:00:00 40 via INTRAVENOUS

## 2023-12-03 MED ORDER — SODIUM CHLORIDE 0.9 % IV SOLN
0.9 | INTRAVENOUS | Status: DC | PRN
Start: 2023-12-03 — End: 2023-12-03

## 2023-12-03 MED ORDER — SODIUM CHLORIDE 0.9 % IV SOLN
0.9 | INTRAVENOUS | Status: DC
Start: 2023-12-03 — End: 2023-12-03
  Administered 2023-12-03: 13:00:00 via INTRAVENOUS

## 2023-12-03 MED ORDER — PROPOFOL 500 MG/50ML IV EMUL
500 | INTRAVENOUS | Status: AC
Start: 2023-12-03 — End: 2023-12-03

## 2023-12-03 MED ORDER — SIMETHICONE 40 MG/0.6ML PO SUSP
40 | ORAL | Status: DC | PRN
Start: 2023-12-03 — End: 2023-12-03
  Administered 2023-12-03: 14:00:00 20

## 2023-12-03 MED ORDER — PROPOFOL 200 MG/20ML IV EMUL
200 | INTRAVENOUS | Status: AC
Start: 2023-12-03 — End: 2023-12-03

## 2023-12-03 MED FILL — XYLOCAINE-MPF 2 % IJ SOLN: 2 % | INTRAMUSCULAR | Qty: 5 | Fill #0

## 2023-12-03 MED FILL — DIPRIVAN 500 MG/50ML IV EMUL: 500 MG/50ML | INTRAVENOUS | Qty: 50 | Fill #0

## 2023-12-03 MED FILL — DIPRIVAN 200 MG/20ML IV EMUL: 200 MG/20ML | INTRAVENOUS | Qty: 20 | Fill #0

## 2023-12-03 NOTE — Op Note (Signed)
 PROCEDURE NOTE    DATE OF PROCEDURE: 12/03/2023    SURGEON: Chalmer Adolphus, MD  Facility : [facility]  ASSISTANT: None  Anesthesia: Monitored anesthesia care  PREOPERATIVE DIAGNOSIS: Chronic diarrhea    POSTOPERATIVE DIAGNOSIS: as described below    OPERATION: Total colonoscopy     ANESTHESIA: Moderate Sedation    ESTIMATED BLOOD LOSS: less than 50     COMPLICATIONS: None.     SPECIMENS:  Was Obtained: colonic mucosal biopsy    HISTORY: The patient is a 73 y.o. year old female with history of above preop diagnosis.  I recommended colonoscopy with possible biopsy or polypectomy and I explained the risk, benefits, expected outcome, and alternatives to the procedure.  Risks included but are not limited to bleeding, infection, respiratory distress, hypotension, and perforation of the colon and possibility of missing a lesion.  The patient understands and is in agreement.        PROCEDURE: The patient was given IV conscious sedation.  The patient's SPO2 remained above 90% throughout the procedure.     Digital rectal exam- normal    The colonoscope was inserted per rectum and advanced under direct vision to the cecum without difficulty.      Post sedation note :The patient's SPO2 remained above 90% throughout the procedure.the vital signs remained stable , and no immediate complication form the procedure noted, patient will be ready for d/c when criteria is met .        The prep was good.      Findings:  Terminal ileum: Distil 7 cm examined normal    Cecum: normal    Ascending colon: normal    Transverse colon: normal    Descending/Sigmoid colon: normal    Rectum/Anus: examined in normal and retroflexed positions and was normal; hemorrhoids small    Random colonic mucosal biopsies obtained to r/o- microscopic colitis    Withdrawal Time was (minutes): 8    Diverticulosis: left sided, moderate    The colon was decompressed and the scope was removed.  The patient tolerated the procedure well.     Impression: Left sided  diverticulosis    Recommendations/Plan:   Lifestyle and dietary modifications as discussed  Continue current medications  F/U Biopsies  F/U In Office Yes  Repeat colonoscopy in 10 years    Electronically signed by Chalmer Adolphus, MD  on 12/03/2023 at 10:38 AM

## 2023-12-03 NOTE — Anesthesia Post-Procedure Evaluation (Signed)
 Department of Anesthesiology  Postprocedure Note    Patient: Marissa Sawyer  MRN: [id number]  Birthdate: 04/06/1950  Date of evaluation: 12/03/2023    Procedure Summary       Date: 12/03/23 Room / Location: [facility] ENDO 02 / [facility]    Anesthesia Start: 1012 Anesthesia Stop: 1037    Procedure: COLONOSCOPY BIOPSY (Rectum) Diagnosis:       Screening for colon cancer      (Screening for colon cancer [Z12.11])    Surgeons: Marlyce Northern, MD Responsible Provider: Camille Rush, MD    Anesthesia Type: MAC ASA Status: 2            Anesthesia Type: No value filed.    Aldrete Phase I: Aldrete Score: 10    Aldrete Phase II: Aldrete Score: 7    Anesthesia Post Evaluation    Patient location during evaluation: PACU  Patient participation: complete - patient participated  Level of consciousness: awake  Airway patency: patent  Nausea & Vomiting: no nausea and no vomiting  Cardiovascular status: blood pressure returned to baseline  Respiratory status: acceptable  Hydration status: euvolemic  Multimodal analgesia pain management approach  Pain management: adequate      No notable events documented.

## 2023-12-03 NOTE — Discharge Instructions (Signed)
 Colonoscopy: What to Expect at Home  Your Recovery  After you have a colonoscopy, you will stay at the clinic for 1 to 2 hours until the medicines wear off. Then you can go home, but you will need to arrange for a ride. Your doctor will tell you when you can eat and do your other usual activities.  Your doctor will talk to you about when you will need your next colonoscopy. The results of your test and your risk for colorectal cancer will help your doctor decide how often you need to be checked.  After the test, you may be bloated or have gas pains. You may need to pass gas. If a biopsy was done or a polyp was removed, you may have streaks of blood in your stool (feces) for a few days.  This care sheet gives you a general idea about how long it will take for you to recover. But each person recovers at a different pace. Follow the steps below to get better as quickly as possible.  How can you care for yourself at home?  Activity  Rest as much as you need to after you go home.  You should be able to go back to your usual activities the day after the test.  Diet  Follow your doctor's directions for eating.  Drink plenty of fluids (unless your doctor has told you not to) to replace the fluids that were lost during the colon prep.  Do not drink alcohol .  Medicines  If polyps were removed or a biopsy was done during the test, your doctor may tell you not to take aspirin or other anti-inflammatory medicines, such as ibuprofen  (Advil , Motrin ) and naproxen  (Aleve ), for a few days.  Other instructions  For your safety, you should not drive or operate machinery until the medicine effects are gone and you can think clearly. Your doctor may tell you not to drive or operate machinery until the day after your test.  Do not sign legal documents or make major decisions until the medicine effects are gone and you can think clearly. The anesthesia medicine can make it hard for you to fully understand what you are agreeing  to.  Follow-up care is a key part of your treatment and safety. Be sure to make and go to all appointments, and call your doctor if you are having problems. It's also a good idea to know your test results and keep a list of the medicines you take.    Call your Doctor if you have any of the following:             Passing blood rectally or vomiting blood (it may be red or black).      Persistent nausea or vomiting.      Severe abdominal or chest pain, not relieved by passing gas.      Fever of 100 or more, chills or excessive sweating.      Redness or swelling at the IV site.     If you experience shortness of breath or severe chest pain, call 911.           Where can you learn more?   Go to [email or web address] and sign in to your MyChart account. Enter E264 in the Search Health Information box to learn more about "Colonoscopy: What to Expect at Home."    If you do not have an account, please click on the "Sign Up Now" link.      2006-2013 [employer].  Care instructions adapted under license by [facility]. This care instruction is for use with your licensed healthcare professional. If you have questions about a medical condition or this instruction, always ask your healthcare professional. [employer], [employer] disclaims any warranty or liability for your use of this information.  Content Version: 9.9.209917; Last Revised: April 26, 2011       Hemorrhoids: Care Instructions  Overview     Hemorrhoids are swollen veins that develop in the anal canal. Bleeding during bowel movements, itching, and rectal pain are the most common symptoms. Hemorrhoids can be uncomfortable at times, but rarely are they a serious problem.  Most of the time, you can treat them with simple changes to your diet and bowel habits. These changes include eating more fiber and not straining to pass stools. Most hemorrhoids don't need surgery or other treatment unless they are very large and  painful or bleed a lot.  Follow-up care is a key part of your treatment and safety. Be sure to make and go to all appointments, and call your doctor if you are having problems. It's also a good idea to know your test results and keep a list of the medicines you take.  How can you care for yourself at home?  Sit in a few inches of warm water (sitz bath) 3 times a day and after bowel movements. The warm water helps with pain and itching.  Put ice on your anal area several times a day for 10 minutes at a time. Put a thin cloth between the ice and your skin. Follow this by placing a warm, wet towel on the area for another 10 to 20 minutes.  Take pain medicines exactly as directed.  If the doctor gave you a prescription medicine for pain, take it as prescribed.  If you are not taking a prescription pain medicine, ask your doctor if you can take an over-the-counter medicine.  Keep the anal area clean, but be gentle. Use water and a fragrance-free soap, or use baby wipes or medicated pads such as Tucks.  Wear cotton underwear and loose clothing to decrease moisture in the anal area.  Eat more fiber. Include foods such as whole-grain breads and cereals, raw vegetables, raw and dried fruits, and beans.  Drink plenty of fluids. If you have kidney, heart, or liver disease and have to limit fluids, talk with your doctor before you increase the amount of fluids you drink.  Use a stool softener that contains bran or psyllium. You can save money by buying bran or psyllium (available in bulk at most health food stores) and sprinkling it on foods or stirring it into fruit juice. Or you can use a product such as Metamucil or Hydrocil.  Practice healthy bowel habits.  Go to the bathroom as soon as you have the urge.  Avoid straining to pass stools. Relax and give yourself time to let things happen naturally.  Do not hold your breath while passing stools.  Do not read while sitting on the toilet. Get off the toilet as soon as you have  finished.  Take your medicines exactly as prescribed. Call your doctor if you think you are having a problem with your medicine.  When should you call for help?   Call 911 anytime you think you may need emergency care. For example, call if:    You pass maroon or very bloody stools.   Call your doctor now or seek immediate medical care if:  You have increased pain.     You have increased bleeding.   Watch closely for changes in your health, and be sure to contact your doctor if:    Your symptoms have not improved after 3 or 4 days.   Where can you learn more?  Go to [email or web address] and enter F228 to learn more about Hemorrhoids: Care Instructions.  Current as of: December 23, 2022  Content Version: 14.6   2024-2025 [employer] [place].   Care instructions adapted under license by [facility]. If you have questions about a medical condition or this instruction, always ask your healthcare professional. Romayne Alderman, [facility], disclaims any warranty or liability for your use of this information.  PATIENT INSTRUCTIONS  DIVERTICULOSIS    FOLLOW-UP:  Please make an appointment with your physician as directed.  Call your physician immediately if you have any fevers greater than 102.5,  increasing abdominal pain, GI bleeding (from the colon or rectum),or nausea/vomiting.      CAUSE:  Some people may have congenital diverticulosis, but most people develop diverticulosis around or after age 58 due to a low-fiber, high-fat diet, along with inadequate fluid intake. This is very prevalent in the industrialized world where we eat a lot of processed, low fiber foods.  Diverticuli develop due to firm stool and high pressures in the colon, along with secondary spasm, which causes outpouchings to occus where the small arteries penetrate the wall of the colon to feed the internal lining.  These occur most commonly on the left side and lower portions of the colon.  Diverticuli can then cause bleeding from  the arteries at these sites of weakness when they rupture or the diveritculi can get blocked with stool and debris and become obstructed, causing diverticulosis, which is due to an infection.  When these are infected, diverticulitis, it is often treated with antibiotics and bowel rest, but when severe, recurrent, or if rupture of the colon occurs, it may require surgery.  Following appropriate dietary changes and taking the proper precautions is therefore very important.    DIET:  You should increase your dietary fiber intake and take a fiber supplement twice a day.  Make sure that you are taking a supplement that is just fiber and is not a laxative, which should be noted on the package.  Starting a fiber supplement may cause increased gas, more frequent bowel movements, and distension at first but this should improve after a couple of weeks.  Try to eat whole wheat breads and pasta, more fruits and vegetables, along with brown rice and plenty of fluids.  Avoid small undigestible food items that could get stuck in these outpouchings, such as unpopped popcorn kernals, whole corn, small undigestible seeds, etc..    ACTIVITY:  Exercise is also a great way to prevent constipation and is encouraged.  It may also help prevent progression of your diverticulosis.  Always make sure you take in plenty of fluids when exercising.    MEDICATIONS:  Take an over-the-counter fiber supplement as noted above twice daily.  If your symptoms don't improve with fiber and dietary changes alone your physician may also recommend psyllium or methylcellulose as well.  If your physician has placed you on an antibiotic it is critical that you take the full course of these, even if your symptoms have improved, and that you not miss any doses.    QUESTIONS:  Please feel free to call your physician or the hospital operator if you have any questions, and they  will be glad to assist you.  If you have further questions it may also be helpful to meet  with a dietitician.  High-Fiber Diet     What Is Fiber?   Dietary fiber is a form of carbohydrate found in plants that cannot be digested by humans. All plants contain fiber, including fruits, vegetables, grains, and legumes. Fiber is often classified into two categories: soluble and insoluble.   Soluble fiber draws water into the bowel and can help slow digestion. Examples of foods that are high in soluble fiber include oatmeal, oat bran, barley, legumes (eg, beans and peas), apples, and strawberries.   Insoluble fiber speeds digestion and can add bulk to the stool. Examples of foods that are high in insoluble fiber include whole-wheat products, wheat bran, cauliflower, green beans, and potatoes.   Why Follow a High-Fiber Diet?   A high-fiber diet is often recommended to prevent and treat constipation , hemorrhoids , diverticulitis , and irritable bowel syndrome . Eating a high-fiber diet can also help improve your cholesterol levels, lower your risk of coronary heart disease , reduce your risk of type 2 diabetes , and lower your weight. For people with type 1 or 2 diabetes, a high-fiber diet can also help stabilize blood sugar levels.   How Much Fiber Should I Eat?   A high-fiber diet should contain  20-35 grams  of fiber a day. This is actually the amount recommended for the general adult population; however, most Americans eat only 15 grams of fiber per day.   Digestion of Fiber   Eating a higher fiber diet than usual can take some getting used to by your body's digestive system. To avoid the side effects of sudden increases in dietary fiber (eg, gas, cramping, bloating, and diarrhea), increase fiber gradually and be sure to drink plenty of fluids every day.   Tips for Increasing Fiber Intake   Whenever possible, choose whole grains over refined grains (eg, brown rice instead of white rice, whole-wheat bread instead of white bread).    Include a variety of grains in your diet, such as wheat, rye, barley, oats,  quinoa, and bulgur.    Eat more vegetarian-based meals. Here are some ideas: black bean burgers, eggplant lasagna, and veggie tofu stir-fry.    Choose high-fiber snacks, such as fruits, popcorn, whole-grain crackers, and nuts.    Make whole-grain cereal or whole-grain toast part of your daily breakfast regime.    When eating out, whether ordering a sandwich or dinner, ask for extra vegetables.    When baking, replace part of the white flour with whole-wheat flour. Whole-wheat flour is particularly easy to incorporate into a recipe.    High-Fiber Diet Eating Guide   Food Category   Foods Recommended   Notes   Grains   Whole-grain breads, muffins, bagels, or pita bread Rye bread Whole-wheat crackers or crisp breads Whole-grain or bran cereals Oatmeal, oat bran, or grits Wheat germ Whole-wheat pasta and brown rice   Read the ingredients list on food labels. Look for products that list whole as the first ingredient (eg, whole-wheat, whole oats). Choose cereals with at least 2 grams of fiber per serving.   Vegetables   All vegetables, especially asparagus, bean sprouts, broccoli, Brussels sprouts, cabbage, carrots, cauliflower, celery, corn, greens, green beans, green pepper, onions, peas, potatoes (with skin), snow peas, spinach, squash, sweet potatoes, tomatoes, zucchini   For maximum fiber intake, eat the peels of fruits and vegetablesjust be sure to wash them  well first.   Fruits   All fruits, especially apples, berries, grapefruits, mangoes, nectarines, oranges, peaches, pears, dried fruits (figs, dates, prunes, raisins)   Choose raw fruits and vegetables over juice, cooked, or cannedraw fruit has more fiber. Dried fruit is also a good source of fiber.   Milk   With the exception of yogurt containing inulin (a type of fiber), dairy foods provide little fiber.   Add more fiber by topping your yogurt or cottage cheese with fresh fruit, whole grain or bran cereals, nuts, or seeds.   Meats and Beans   All beans and  peas, especially Garbanzo beans, kidney beans, lentils, lima beans, split peas, and pinto beans All nuts and seeds, especially almonds, peanuts, [place] nuts, cashews, peanut butter, walnuts, sesame and sunflower seeds All meat, poultry, fish, and eggs   Increase fiber in meat dishes by adding pinto beans, kidney beans, black-eyed peas, bran, or oatmeal. If you are following a low-fat diet, use nuts and seeds only in moderation.   Fats and Oils   All in moderation   Fats and oils do not provide fiber   Snacks, Sweets, and Condiments   Fruit Nuts Popcorn, whole-wheat pretzels, or trail mix made with dried fruits, nuts, and seeds Cakes, breads, and cookies made with oatmeal or whole-wheat flour   Most snack foods do not provide much fiber. Choose snacks with at least 2 grams of fiber per serving.     Last Reviewed: March 2011 Hadassah Clause, MS, MPH, RD   Updated: 06/01/2009
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
## 2023-12-03 NOTE — Progress Notes (Signed)
 Accu chek [id number]

## 2023-12-04 ENCOUNTER — Ambulatory Visit
Admit: 2023-12-04 | Discharge: 2023-12-04 | Payer: Medicare (Managed Care) | Attending: Foot & Ankle Surgery | Primary: Family

## 2023-12-04 DIAGNOSIS — B351 Tinea unguium: Principal | ICD-10-CM

## 2023-12-04 LAB — SURGICAL PATHOLOGY REPORT

## 2023-12-04 NOTE — Progress Notes (Signed)
 SUBJECTIVE: Marissa Sawyer is a 73 y.o. female who returns to the office with chief complaint of painful fungal toenails. Patient relates toe nails are thickened/difficult to trim as well as painful with ambulation and with shoe gear.   Chief Complaint   Patient presents with    Nail Problem     Nail trim/last saw Dr.Tara Lang  06/28/23    Diabetes     Blood sugar-132     Review of Systems   Constitutional:  Negative for activity change, appetite change, chills, diaphoresis, fatigue and fever.   Respiratory:  Negative for shortness of breath.    Cardiovascular:  Negative for leg swelling.   Gastrointestinal:  Negative for diarrhea and nausea.   Endocrine: Negative for cold intolerance, heat intolerance and polyuria.   Musculoskeletal:  Positive for arthralgias. Negative for back pain, gait problem, joint swelling and myalgias.   Skin:  Negative for color change, pallor, rash and wound.   Allergic/Immunologic: Negative for environmental allergies and food allergies.   Neurological:  Negative for dizziness, weakness, light-headedness and numbness.   Hematological:  Does not bruise/bleed easily.   Psychiatric/Behavioral:  Negative for behavioral problems, confusion and self-injury. The patient is not nervous/anxious.      OBJECTIVE: Clinical evaluation of patient reveals nails 1,2,3,4,5 of the right foot and nails 1,2,3,4,5 of the left foot to present with thickness, elongation, discoloration, brittleness, and subungual debris. There was pain with palpation and debridement of the toenails of the bilateral feet. No open lesions noted to either foot today.   The right DP pulse is not palpable.   The left DP pulse is not palpable.   The right PT pulse is not palpable.   The left PT pulse is not palpable.   Protective sensation is present to the right plantar foot as noted with a 5.07 Semmes-Weinstein monofilament.   Protective sensation is present to the left plantar foot as noted with a 5.07 Semmes-Weinstein  monofilament.   Glucose: 132 mg/dl.    Class A Findings (1 needed)   []  Non-traumatic amputation of foot or integral skeleton portion thereof.   []  Q7.      Class B Findings (2 needed)   1. [x]  Absent posterior tibial pulse   2. [x]  Absent dorsalis pedis pulse   3. []  Advanced trophic changes; three of the following are required:            []  hair growth (decrease or absence)            []  nail changes (thickening)            []  pigmentary changes (discoloration)            []  skin texture (thin, shiny)            []  skin color (rubor or redness)   [x]  Q8.      Class C Findings (1 Class B, 2 Class C needed)   1. []  Claudication   2. []  Temperature changes   3. []  Edema   4. []  Paresthesia   5. []  Burning   []  Q9.     ASSESSMENT:    Diagnosis Orders   1. Onychomycosis of toenail  DEBRIDEMENT OF NAILS, 6 OR MORE    HM DIABETES FOOT EXAM      2. Pain of toes of both feet  DEBRIDEMENT OF NAILS, 6 OR MORE    HM DIABETES FOOT EXAM      3. Type 2 diabetes mellitus  with peripheral vascular disease (HCC)  DEBRIDEMENT OF NAILS, 6 OR MORE    HM DIABETES FOOT EXAM        PLAN: Toenails 1,2,3,4,5 of the right foot and 1,2,3,4,5 of the left foot were debrided in length and thickness using a nail nipper and a grinder. Return in about 9 weeks (around 02/05/2024) for At risk diabetic foot care.   12/04/2023      Godfrey Tritschler E Lissandro Dilorenzo, DPM

## 2023-12-07 ENCOUNTER — Ambulatory Visit: Admit: 2023-12-07 | Discharge: 2023-12-07 | Payer: Medicare (Managed Care) | Attending: Family | Primary: Family

## 2023-12-07 ENCOUNTER — Inpatient Hospital Stay: Admit: 2023-12-07 | Payer: Medicare (Managed Care) | Primary: Family

## 2023-12-07 VITALS — BP 128/70 | HR 62 | Ht 64.0 in | Wt 149.8 lb

## 2023-12-07 DIAGNOSIS — M545 Low back pain, unspecified: Secondary | ICD-10-CM

## 2023-12-07 MED ORDER — TIZANIDINE HCL 2 MG PO TABS
2 | ORAL_TABLET | Freq: Three times a day (TID) | ORAL | 1 refills | 30.00000 days | Status: AC | PRN
Start: 2023-12-07 — End: ?

## 2023-12-07 MED ORDER — METHYLPREDNISOLONE ACETATE 40 MG/ML IJ SUSP
40 | Freq: Once | INTRAMUSCULAR | Status: AC
Start: 2023-12-07 — End: 2023-12-07
  Administered 2023-12-07: 18:00:00 40 mg via INTRAMUSCULAR

## 2023-12-07 MED ORDER — KETOROLAC TROMETHAMINE 60 MG/2ML IM SOLN
60 | Freq: Once | INTRAMUSCULAR | Status: AC
Start: 2023-12-07 — End: 2023-12-07
  Administered 2023-12-07: 18:00:00 60 mg via INTRAMUSCULAR
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
## 2023-12-07 NOTE — Progress Notes (Signed)
"  Pt in office for left lower back pain    Pt states she has been having the pain for about three weeks now   "

## 2023-12-13 NOTE — Telephone Encounter (Signed)
"  Patient was calling because when she woke up this morning she noticed a lump on the right side of her neck/throat area, under neath her ear. She said that it hurts when she touches it.  "

## 2023-12-13 NOTE — Telephone Encounter (Signed)
"  Patient is scheduled for tomorrow with Candace.  "

## 2023-12-14 ENCOUNTER — Ambulatory Visit: Admit: 2023-12-14 | Discharge: 2023-12-14 | Payer: Medicare (Managed Care) | Attending: Family | Primary: Family

## 2023-12-14 VITALS — BP 124/74 | HR 59 | Ht 64.0 in | Wt 148.8 lb

## 2023-12-14 DIAGNOSIS — J029 Acute pharyngitis, unspecified: Principal | ICD-10-CM

## 2023-12-14 MED ORDER — BENZONATATE 100 MG PO CAPS
100 | ORAL_CAPSULE | Freq: Three times a day (TID) | ORAL | 1 refills | 10.00000 days | Status: AC | PRN
Start: 2023-12-14 — End: 2024-01-03

## 2023-12-14 MED ORDER — AMOXICILLIN 500 MG PO CAPS
500 | ORAL_CAPSULE | Freq: Two times a day (BID) | ORAL | 0 refills | 7.00000 days | Status: AC
Start: 2023-12-14 — End: 2023-12-24
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
## 2023-12-14 NOTE — Progress Notes (Signed)
"  Pt in office for sore throat and lump on neck   "

## 2023-12-19 ENCOUNTER — Encounter

## 2023-12-19 NOTE — Result Encounter Note (Signed)
"  Pt was notified and verbalized understanding. Pt is agreeable to seeing ortho and would like recommendations.   "

## 2024-01-18 ENCOUNTER — Ambulatory Visit
Admit: 2024-01-18 | Discharge: 2024-01-18 | Payer: Medicare (Managed Care) | Attending: Internal Medicine | Primary: Family

## 2024-01-18 VITALS — BP 132/71 | HR 60 | Temp 97.50000°F | Resp 16 | Ht 64.0 in | Wt 150.4 lb

## 2024-01-18 DIAGNOSIS — K580 Irritable bowel syndrome with diarrhea: Principal | ICD-10-CM
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
## 2024-02-05 ENCOUNTER — Ambulatory Visit
Admit: 2024-02-05 | Discharge: 2024-02-05 | Payer: Medicare (Managed Care) | Attending: Foot & Ankle Surgery | Primary: Family

## 2024-02-05 VITALS — Ht 64.0 in | Wt 150.0 lb

## 2024-02-05 DIAGNOSIS — B351 Tinea unguium: Principal | ICD-10-CM

## 2024-02-07 NOTE — Progress Notes (Signed)
 "SUBJECTIVE: Marissa Sawyer is a 73 y.o. female who returns to the office with chief complaint of painful fungal toenails. Patient relates toe nails are thickened/difficult to trim as well as painful with ambulation and with shoe gear.   Chief Complaint   Patient presents with    Nail Problem     B/l nail trim/ last seen Candace Jewell 12/14/2023    Diabetes     Last blood sugar 145     Review of Systems   Constitutional:  Negative for activity change, appetite change, chills, diaphoresis, fatigue and fever.   Respiratory:  Negative for shortness of breath.    Cardiovascular:  Negative for leg swelling.   Gastrointestinal:  Negative for diarrhea and nausea.   Endocrine: Negative for cold intolerance, heat intolerance and polyuria.   Musculoskeletal:  Positive for arthralgias. Negative for back pain, gait problem, joint swelling and myalgias.   Skin:  Negative for color change, pallor, rash and wound.   Allergic/Immunologic: Negative for environmental allergies and food allergies.   Neurological:  Negative for dizziness, weakness, light-headedness and numbness.   Hematological:  Does not bruise/bleed easily.   Psychiatric/Behavioral:  Negative for behavioral problems, confusion and self-injury. The patient is not nervous/anxious.      OBJECTIVE: Clinical evaluation of patient reveals nails 1,2,3,4,5 of the right foot and nails 1,2,3,4,5 of the left foot to present with thickness, elongation, discoloration, brittleness, and subungual debris. There was pain with palpation and debridement of the toenails of the bilateral feet. No open lesions noted to either foot today.   The right DP pulse is not palpable.   The left DP pulse is not palpable.   The right PT pulse is not palpable.   The left PT pulse is not palpable.   Protective sensation is present to the right plantar foot as noted with a 5.07 Semmes-Weinstein monofilament.   Protective sensation is present to the left plantar foot as noted with a 5.07 Semmes-Weinstein  monofilament.   Glucose: 145 mg/dl.    Class A Findings (1 needed)   []  Non-traumatic amputation of foot or integral skeleton portion thereof.   []  Q7.      Class B Findings (2 needed)   1. [x]  Absent posterior tibial pulse   2. [x]  Absent dorsalis pedis pulse   3. []  Advanced trophic changes; three of the following are required:            []  hair growth (decrease or absence)            []  nail changes (thickening)            []  pigmentary changes (discoloration)            []  skin texture (thin, shiny)            []  skin color (rubor or redness)   [x]  Q8.      Class C Findings (1 Class B, 2 Class C needed)   1. []  Claudication   2. []  Temperature changes   3. []  Edema   4. []  Paresthesia   5. []  Burning   []  Q9.     ASSESSMENT:    Diagnosis Orders   1. Onychomycosis of toenail  DEBRIDEMENT OF NAILS, 6 OR MORE    HM DIABETES FOOT EXAM      2. Pain of toes of both feet  DEBRIDEMENT OF NAILS, 6 OR MORE    HM DIABETES FOOT EXAM      3. Type  2 diabetes mellitus with peripheral vascular disease (HCC)  DEBRIDEMENT OF NAILS, 6 OR MORE    HM DIABETES FOOT EXAM        PLAN: Toenails 1,2,3,4,5 of the right foot and 1,2,3,4,5 of the left foot were debrided in length and thickness using a nail nipper and a grinder. Return in about 9 weeks (around 04/08/2024) for At risk diabetic foot care.   02/05/2024      Guillermina FORBES Smoke, DPM   "

## 2024-03-21 ENCOUNTER — Ambulatory Visit: Admit: 2024-03-21 | Discharge: 2024-03-21 | Payer: Medicare (Managed Care) | Attending: Family | Primary: Family

## 2024-03-21 ENCOUNTER — Inpatient Hospital Stay: Admit: 2024-03-21 | Payer: Medicare (Managed Care) | Primary: Family

## 2024-03-21 DIAGNOSIS — E119 Type 2 diabetes mellitus without complications: Principal | ICD-10-CM

## 2024-03-21 LAB — POCT URINALYSIS DIPSTICK W/O MICROSCOPE (AUTO)
Bilirubin, UA: NEGATIVE
Glucose, UA POC: NEGATIVE mg/dL
Ketones, UA: NEGATIVE mg/dL
Nitrite, UA: NEGATIVE
Protein, UA POC: NEGATIVE mg/dL
Spec Grav, UA: 1.02
Urobilinogen, UA: 0.2 mg/dL
pH, UA: 5

## 2024-03-21 LAB — CBC WITH AUTO DIFFERENTIAL
Basophils %: 0 % (ref 0–2)
Basophils Absolute: <0.03 k/uL (ref 0.00–0.20)
Eosinophils %: 3 % (ref 1–4)
Eosinophils Absolute: 0.16 k/uL (ref 0.00–0.44)
Hematocrit: 37.8 % (ref 36.3–47.1)
Hemoglobin: 12.4 g/dL (ref 11.9–15.1)
Immature Granulocytes %: 0 %
Immature Granulocytes Absolute: <0.03 k/uL (ref 0.00–0.30)
Lymphocytes %: 30 % (ref 24–43)
Lymphocytes Absolute: 1.69 k/uL (ref 1.10–3.70)
MCH: 29.7 pg (ref 25.2–33.5)
MCHC: 32.8 g/dL (ref 28.4–34.8)
MCV: 90.4 fL (ref 82.6–102.9)
MPV: 10.3 fL (ref 8.1–13.5)
Monocytes %: 10 % (ref 3–12)
Monocytes Absolute: 0.57 k/uL (ref 0.10–1.20)
NRBC Automated: 0 /100{WBCs}
Neutrophils %: 57 % (ref 36–65)
Neutrophils Absolute: 3.18 k/uL (ref 1.50–8.10)
Platelets: 237 k/uL (ref 138–453)
RBC: 4.18 m/uL (ref 3.95–5.11)
RDW: 12.9 % (ref 11.8–14.4)
WBC: 5.6 k/uL (ref 3.5–11.3)

## 2024-03-21 LAB — COMPREHENSIVE METABOLIC PANEL
ALT: 17 U/L (ref 10–35)
AST: 29 U/L (ref 10–35)
Albumin/Globulin Ratio: 1.1 (ref 1.0–2.5)
Albumin: 4 g/dL (ref 3.5–5.2)
Alkaline Phosphatase: 72 U/L (ref 35–104)
Anion Gap: 10 mmol/L (ref 9–16)
BUN: 21 mg/dL (ref 8–23)
CO2: 23 mmol/L (ref 20–31)
Calcium: 9.5 mg/dL (ref 8.6–10.4)
Chloride: 107 mmol/L (ref 98–107)
Creatinine: 0.8 mg/dL (ref 0.6–0.9)
Est, Glom Filt Rate: 77 mL/min/1.73m2 (ref >60)
Glucose: 130 mg/dL — ABNORMAL HIGH (ref 74–99)
Potassium: 4.8 mmol/L (ref 3.7–5.3)
Sodium: 140 mmol/L (ref 136–145)
Total Bilirubin: 0.4 mg/dL (ref 0.0–1.2)
Total Protein: 7.6 g/dL (ref 6.6–8.7)

## 2024-03-21 LAB — LIPID PANEL
Chol/HDL Ratio: 3.2 (ref <5.0)
Cholesterol, Total: 136 mg/dL (ref 0–199)
HDL: 42 mg/dL (ref >40)
LDL Cholesterol: 79 mg/dL (ref 0–100)
Triglycerides: 77 mg/dL (ref <150)
VLDL: 15 mg/dL (ref 1–30)

## 2024-03-21 LAB — POCT GLYCOSYLATED HEMOGLOBIN (HGB A1C): Hemoglobin A1C: 6.7 %

## 2024-03-21 LAB — VITAMIN D 25 HYDROXY: Vit D, 25-Hydroxy: 29.8 ng/mL — ABNORMAL LOW (ref 30.0–100.0)
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
## 2024-03-22 LAB — VAGINITIS DNA PROBE
Candida species: NEGATIVE
GARDNERELLA VAGINALIS: NEGATIVE
Trichomonas: NEGATIVE

## 2024-03-22 LAB — ALBUMIN/CREATININE RATIO, URINE
Albumin Urine: <12 mg/L (ref 0–20)
Creatinine, Ur: 104 mg/dL (ref 28.0–217.0)

## 2024-03-22 LAB — CULTURE, URINE: Culture: NO GROWTH

## 2024-03-24 ENCOUNTER — Encounter

## 2024-03-24 MED ORDER — VITAMIN D3 25 MCG (1000 UT) PO TABS
25 | ORAL_TABLET | Freq: Every day | ORAL | 1 refills | 30.00000 days | Status: AC
Start: 2024-03-24 — End: ?

## 2024-03-28 ENCOUNTER — Encounter: Payer: Medicare (Managed Care) | Attending: Internal Medicine | Primary: Family

## 2024-04-03 MED ORDER — LISINOPRIL 10 MG PO TABS
10 | ORAL_TABLET | Freq: Every day | ORAL | 1 refills | Status: AC
Start: 2024-04-03 — End: ?

## 2024-04-03 NOTE — Telephone Encounter (Signed)
 Next Visit Date:  Future Appointments   Date Time Provider Department Center   04/08/2024  1:15 PM Omar Guillermina B[facility] [place]  [id number]   09/24/2024 11:30 AM Bonney Pellet, APRN - CNP [facility] DEP       Last Visit Date:  03/21/2024  Last Fill:  07/10/2023

## 2024-04-08 ENCOUNTER — Ambulatory Visit
Admit: 2024-04-08 | Discharge: 2024-04-08 | Payer: Medicare (Managed Care) | Attending: Foot & Ankle Surgery | Primary: Family
